# Patient Record
Sex: Male | Born: 1980
Health system: Southern US, Community
[De-identification: ages and names within clinical notes are randomized; demographics above are authoritative.]

## PROBLEM LIST (undated history)

## (undated) DIAGNOSIS — F4310 Post-traumatic stress disorder, unspecified: Secondary | ICD-10-CM

## (undated) DIAGNOSIS — E669 Obesity, unspecified: Secondary | ICD-10-CM

## (undated) DIAGNOSIS — F32A Depression, unspecified: Secondary | ICD-10-CM

## (undated) DIAGNOSIS — B029 Zoster without complications: Secondary | ICD-10-CM

## (undated) DIAGNOSIS — K219 Gastro-esophageal reflux disease without esophagitis: Secondary | ICD-10-CM

## (undated) DIAGNOSIS — F329 Major depressive disorder, single episode, unspecified: Secondary | ICD-10-CM

## (undated) DIAGNOSIS — F419 Anxiety disorder, unspecified: Secondary | ICD-10-CM

## (undated) DIAGNOSIS — F909 Attention-deficit hyperactivity disorder, unspecified type: Secondary | ICD-10-CM

## (undated) HISTORY — DX: Gastro-esophageal reflux disease without esophagitis: K21.9

## (undated) HISTORY — DX: Zoster without complications: B02.9

## (undated) HISTORY — PX: WISDOM TOOTH EXTRACTION: SHX21

## (undated) HISTORY — DX: Attention-deficit hyperactivity disorder, unspecified type: F90.9

## (undated) HISTORY — DX: Anxiety disorder, unspecified: F41.9

## (undated) HISTORY — DX: Major depressive disorder, single episode, unspecified: F32.9

## (undated) HISTORY — DX: Depression, unspecified: F32.A

## (undated) HISTORY — DX: Post-traumatic stress disorder, unspecified: F43.10

## (undated) HISTORY — DX: Obesity, unspecified: E66.9

---
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
## 2004-05-23 ENCOUNTER — Ambulatory Visit: Payer: Self-pay | Admitting: Family Medicine

## 2004-05-26 ENCOUNTER — Ambulatory Visit: Payer: Self-pay | Admitting: Family Medicine

## 2004-08-07 ENCOUNTER — Emergency Department (HOSPITAL_COMMUNITY): Admission: EM | Admit: 2004-08-07 | Discharge: 2004-08-07 | Payer: Self-pay | Admitting: Emergency Medicine

## 2004-09-08 ENCOUNTER — Emergency Department (HOSPITAL_COMMUNITY): Admission: EM | Admit: 2004-09-08 | Discharge: 2004-09-09 | Payer: Self-pay | Admitting: *Deleted

## 2004-09-17 ENCOUNTER — Emergency Department (HOSPITAL_COMMUNITY): Admission: EM | Admit: 2004-09-17 | Discharge: 2004-09-17 | Payer: Self-pay | Admitting: Emergency Medicine

## 2004-09-22 ENCOUNTER — Emergency Department (HOSPITAL_COMMUNITY): Admission: EM | Admit: 2004-09-22 | Discharge: 2004-09-22 | Payer: Self-pay | Admitting: *Deleted

## 2004-10-05 ENCOUNTER — Inpatient Hospital Stay (HOSPITAL_COMMUNITY): Admission: RE | Admit: 2004-10-05 | Discharge: 2004-10-09 | Payer: Self-pay | Admitting: Psychiatry

## 2004-10-05 ENCOUNTER — Ambulatory Visit: Payer: Self-pay | Admitting: Family Medicine

## 2004-10-06 ENCOUNTER — Ambulatory Visit: Payer: Self-pay | Admitting: Psychiatry

## 2004-10-10 ENCOUNTER — Other Ambulatory Visit (HOSPITAL_COMMUNITY): Admission: RE | Admit: 2004-10-10 | Discharge: 2004-10-21 | Payer: Self-pay | Admitting: Psychiatry

## 2004-11-09 ENCOUNTER — Ambulatory Visit (HOSPITAL_COMMUNITY): Payer: Self-pay | Admitting: Psychiatry

## 2004-12-06 ENCOUNTER — Ambulatory Visit: Payer: Self-pay | Admitting: Psychiatry

## 2004-12-06 ENCOUNTER — Other Ambulatory Visit (HOSPITAL_COMMUNITY): Admission: RE | Admit: 2004-12-06 | Discharge: 2004-12-16 | Payer: Self-pay | Admitting: Psychiatry

## 2005-01-24 ENCOUNTER — Emergency Department (HOSPITAL_COMMUNITY): Admission: EM | Admit: 2005-01-24 | Discharge: 2005-01-24 | Payer: Self-pay | Admitting: Emergency Medicine

## 2005-01-25 ENCOUNTER — Ambulatory Visit (HOSPITAL_COMMUNITY): Payer: Self-pay | Admitting: Psychiatry

## 2005-02-15 ENCOUNTER — Ambulatory Visit (HOSPITAL_COMMUNITY): Payer: Self-pay | Admitting: Psychiatry

## 2005-03-30 ENCOUNTER — Ambulatory Visit: Payer: Self-pay | Admitting: Family Medicine

## 2005-03-31 ENCOUNTER — Ambulatory Visit: Payer: Self-pay | Admitting: Family Medicine

## 2005-04-12 ENCOUNTER — Ambulatory Visit (HOSPITAL_COMMUNITY): Payer: Self-pay | Admitting: Psychiatry

## 2005-04-25 ENCOUNTER — Emergency Department (HOSPITAL_COMMUNITY): Admission: EM | Admit: 2005-04-25 | Discharge: 2005-04-25 | Payer: Self-pay | Admitting: Emergency Medicine

## 2005-06-07 ENCOUNTER — Ambulatory Visit (HOSPITAL_COMMUNITY): Payer: Self-pay | Admitting: Psychiatry

## 2005-07-26 ENCOUNTER — Ambulatory Visit (HOSPITAL_COMMUNITY): Payer: Self-pay | Admitting: Psychiatry

## 2005-08-05 ENCOUNTER — Emergency Department (HOSPITAL_COMMUNITY): Admission: AD | Admit: 2005-08-05 | Discharge: 2005-08-05 | Payer: Self-pay | Admitting: Family Medicine

## 2005-08-17 ENCOUNTER — Emergency Department (HOSPITAL_COMMUNITY): Admission: EM | Admit: 2005-08-17 | Discharge: 2005-08-17 | Payer: Self-pay | Admitting: Family Medicine

## 2005-08-31 ENCOUNTER — Ambulatory Visit: Payer: Self-pay | Admitting: Family Medicine

## 2006-02-21 ENCOUNTER — Ambulatory Visit (HOSPITAL_COMMUNITY): Payer: Self-pay | Admitting: Psychiatry

## 2006-03-20 ENCOUNTER — Ambulatory Visit: Payer: Self-pay | Admitting: Family Medicine

## 2006-04-12 ENCOUNTER — Ambulatory Visit: Payer: Self-pay | Admitting: Family Medicine

## 2006-04-25 ENCOUNTER — Ambulatory Visit (HOSPITAL_COMMUNITY): Payer: Self-pay | Admitting: Psychiatry

## 2006-06-07 ENCOUNTER — Ambulatory Visit (HOSPITAL_COMMUNITY): Payer: Self-pay | Admitting: Psychiatry

## 2006-08-15 ENCOUNTER — Ambulatory Visit (HOSPITAL_COMMUNITY): Payer: Self-pay | Admitting: Psychiatry

## 2006-11-14 ENCOUNTER — Ambulatory Visit (HOSPITAL_COMMUNITY): Payer: Self-pay | Admitting: Psychiatry

## 2007-07-10 ENCOUNTER — Ambulatory Visit (HOSPITAL_COMMUNITY): Payer: Self-pay | Admitting: Psychiatry

## 2007-10-09 ENCOUNTER — Ambulatory Visit (HOSPITAL_COMMUNITY): Payer: Self-pay | Admitting: Psychiatry

## 2007-11-20 ENCOUNTER — Ambulatory Visit (HOSPITAL_COMMUNITY): Payer: Self-pay | Admitting: Psychiatry

## 2007-11-21 ENCOUNTER — Ambulatory Visit: Payer: Self-pay | Admitting: Gastroenterology

## 2007-11-21 DIAGNOSIS — F902 Attention-deficit hyperactivity disorder, combined type: Secondary | ICD-10-CM

## 2007-11-21 DIAGNOSIS — K920 Hematemesis: Secondary | ICD-10-CM | POA: Insufficient documentation

## 2007-11-21 DIAGNOSIS — R197 Diarrhea, unspecified: Secondary | ICD-10-CM | POA: Insufficient documentation

## 2007-11-21 DIAGNOSIS — R112 Nausea with vomiting, unspecified: Secondary | ICD-10-CM | POA: Insufficient documentation

## 2007-11-22 ENCOUNTER — Ambulatory Visit: Payer: Self-pay | Admitting: Gastroenterology

## 2007-11-22 LAB — CONVERTED CEMR LAB
ALT: 74 units/L — ABNORMAL HIGH (ref 0–53)
Alkaline Phosphatase: 90 units/L (ref 39–117)
BUN: 7 mg/dL (ref 6–23)
Basophils Relative: 1.7 % (ref 0.0–3.0)
CO2: 29 meq/L (ref 19–32)
Calcium: 9.5 mg/dL (ref 8.4–10.5)
Chloride: 102 meq/L (ref 96–112)
Creatinine, Ser: 1.1 mg/dL (ref 0.4–1.5)
Glucose, Bld: 96 mg/dL (ref 70–99)
Hemoglobin: 14.7 g/dL (ref 13.0–17.0)
Lipase: 31 units/L (ref 11.0–59.0)
Lymphocytes Relative: 18.9 % (ref 12.0–46.0)
MCHC: 35.5 g/dL (ref 30.0–36.0)
Neutro Abs: 4 10*3/uL (ref 1.4–7.7)
Neutrophils Relative %: 68.1 % (ref 43.0–77.0)
Potassium: 4.3 meq/L (ref 3.5–5.1)
RBC: 3.77 M/uL — ABNORMAL LOW (ref 4.22–5.81)
Sodium: 139 meq/L (ref 135–145)
Total Bilirubin: 0.9 mg/dL (ref 0.3–1.2)
Total Protein: 6.6 g/dL (ref 6.0–8.3)

## 2007-11-27 ENCOUNTER — Ambulatory Visit (HOSPITAL_COMMUNITY): Admission: RE | Admit: 2007-11-27 | Discharge: 2007-11-27 | Payer: Self-pay | Admitting: Gastroenterology

## 2007-11-27 ENCOUNTER — Ambulatory Visit: Payer: Self-pay | Admitting: Gastroenterology

## 2007-11-28 LAB — CONVERTED CEMR LAB: Vitamin B-12: 307 pg/mL (ref 211–911)

## 2007-12-17 ENCOUNTER — Encounter: Payer: Self-pay | Admitting: Gastroenterology

## 2008-01-01 ENCOUNTER — Ambulatory Visit (HOSPITAL_COMMUNITY): Payer: Self-pay | Admitting: Psychiatry

## 2008-01-21 ENCOUNTER — Ambulatory Visit: Payer: Self-pay | Admitting: Gastroenterology

## 2008-01-21 DIAGNOSIS — R7402 Elevation of levels of lactic acid dehydrogenase (LDH): Secondary | ICD-10-CM | POA: Insufficient documentation

## 2008-01-21 DIAGNOSIS — R748 Abnormal levels of other serum enzymes: Secondary | ICD-10-CM | POA: Insufficient documentation

## 2008-01-21 DIAGNOSIS — R740 Nonspecific elevation of levels of transaminase and lactic acid dehydrogenase [LDH]: Secondary | ICD-10-CM

## 2008-01-21 DIAGNOSIS — R634 Abnormal weight loss: Secondary | ICD-10-CM | POA: Insufficient documentation

## 2008-01-21 DIAGNOSIS — K219 Gastro-esophageal reflux disease without esophagitis: Secondary | ICD-10-CM

## 2008-01-21 DIAGNOSIS — R197 Diarrhea, unspecified: Secondary | ICD-10-CM | POA: Insufficient documentation

## 2008-03-18 ENCOUNTER — Ambulatory Visit: Payer: Self-pay | Admitting: Gastroenterology

## 2008-03-18 LAB — CONVERTED CEMR LAB
ALT: 88 units/L — ABNORMAL HIGH (ref 0–53)
Albumin: 4.4 g/dL (ref 3.5–5.2)
Bilirubin, Direct: 0.3 mg/dL (ref 0.0–0.3)

## 2008-03-26 LAB — CONVERTED CEMR LAB
ALT: 36 units/L (ref 0–53)
Total Protein: 7.1 g/dL (ref 6.0–8.3)

## 2008-04-02 ENCOUNTER — Telehealth: Payer: Self-pay | Admitting: Gastroenterology

## 2008-04-08 ENCOUNTER — Ambulatory Visit: Payer: Self-pay | Admitting: Gastroenterology

## 2008-04-08 LAB — CONVERTED CEMR LAB
Ferritin: 449 ng/mL — ABNORMAL HIGH (ref 22.0–322.0)
INR: 0.9 (ref 0.8–1.0)

## 2008-04-09 ENCOUNTER — Ambulatory Visit: Payer: Self-pay | Admitting: Gastroenterology

## 2008-04-13 ENCOUNTER — Encounter: Payer: Self-pay | Admitting: Gastroenterology

## 2008-04-19 LAB — CONVERTED CEMR LAB
Angiotensin 1 Converting Enzyme: 26 units/L (ref 9–67)
Anti Nuclear Antibody(ANA): NEGATIVE
Ceruloplasmin: 16 mg/dL — ABNORMAL LOW (ref 21–63)
Hep A IgM: NEGATIVE
Hep B C IgM: NEGATIVE

## 2008-04-22 ENCOUNTER — Encounter: Payer: Self-pay | Admitting: Gastroenterology

## 2008-04-28 ENCOUNTER — Telehealth (INDEPENDENT_AMBULATORY_CARE_PROVIDER_SITE_OTHER): Payer: Self-pay

## 2008-05-27 ENCOUNTER — Ambulatory Visit (HOSPITAL_COMMUNITY): Payer: Self-pay | Admitting: Psychiatry

## 2008-06-11 ENCOUNTER — Ambulatory Visit: Payer: Self-pay | Admitting: Gastroenterology

## 2008-07-08 ENCOUNTER — Ambulatory Visit (HOSPITAL_COMMUNITY): Payer: Self-pay | Admitting: Psychiatry

## 2008-07-13 ENCOUNTER — Ambulatory Visit: Payer: Self-pay | Admitting: Gastroenterology

## 2008-07-26 ENCOUNTER — Emergency Department (HOSPITAL_COMMUNITY): Admission: EM | Admit: 2008-07-26 | Discharge: 2008-07-26 | Payer: Self-pay | Admitting: Emergency Medicine

## 2008-07-29 ENCOUNTER — Ambulatory Visit (HOSPITAL_COMMUNITY): Payer: Self-pay | Admitting: Psychiatry

## 2008-12-14 ENCOUNTER — Ambulatory Visit: Payer: Self-pay | Admitting: Psychiatry

## 2008-12-14 ENCOUNTER — Inpatient Hospital Stay (HOSPITAL_COMMUNITY): Admission: AD | Admit: 2008-12-14 | Discharge: 2008-12-16 | Payer: Self-pay | Admitting: Psychiatry

## 2008-12-17 ENCOUNTER — Other Ambulatory Visit (HOSPITAL_COMMUNITY): Admission: RE | Admit: 2008-12-17 | Discharge: 2009-01-15 | Payer: Self-pay | Admitting: Psychiatry

## 2008-12-17 ENCOUNTER — Ambulatory Visit: Payer: Self-pay | Admitting: Psychiatry

## 2009-01-18 ENCOUNTER — Other Ambulatory Visit (HOSPITAL_COMMUNITY): Admission: RE | Admit: 2009-01-18 | Discharge: 2009-02-04 | Payer: Self-pay | Admitting: Psychiatry

## 2009-02-18 ENCOUNTER — Telehealth: Payer: Self-pay | Admitting: Gastroenterology

## 2009-02-24 ENCOUNTER — Ambulatory Visit (HOSPITAL_COMMUNITY): Payer: Self-pay | Admitting: Psychiatry

## 2009-03-12 ENCOUNTER — Ambulatory Visit: Payer: Self-pay | Admitting: Gastroenterology

## 2009-03-15 LAB — CONVERTED CEMR LAB
AST: 53 units/L — ABNORMAL HIGH (ref 0–37)
Albumin: 4.5 g/dL (ref 3.5–5.2)
Alkaline Phosphatase: 81 units/L (ref 39–117)
Bilirubin, Direct: 0.2 mg/dL (ref 0.0–0.3)

## 2009-03-17 ENCOUNTER — Ambulatory Visit (HOSPITAL_COMMUNITY): Payer: Self-pay | Admitting: Marriage and Family Therapist

## 2009-04-13 ENCOUNTER — Telehealth: Payer: Self-pay | Admitting: Internal Medicine

## 2009-05-10 ENCOUNTER — Ambulatory Visit: Payer: Self-pay | Admitting: Gastroenterology

## 2009-05-19 ENCOUNTER — Ambulatory Visit (HOSPITAL_COMMUNITY): Payer: Self-pay | Admitting: Psychiatry

## 2009-06-29 ENCOUNTER — Ambulatory Visit: Payer: Self-pay | Admitting: Psychiatry

## 2009-11-11 LAB — CONVERTED CEMR LAB
ALT: 38 units/L (ref 0–53)
Albumin: 4.3 g/dL (ref 3.5–5.2)
Total Protein: 6.6 g/dL (ref 6.0–8.3)

## 2009-12-01 ENCOUNTER — Ambulatory Visit: Payer: Self-pay | Admitting: Internal Medicine

## 2009-12-02 LAB — CONVERTED CEMR LAB
Alkaline Phosphatase: 98 units/L (ref 39–117)
Bilirubin, Direct: 0.1 mg/dL (ref 0.0–0.3)
Total Bilirubin: 0.6 mg/dL (ref 0.3–1.2)

## 2010-02-07 ENCOUNTER — Telehealth: Payer: Self-pay | Admitting: Gastroenterology

## 2010-02-15 NOTE — Progress Notes (Signed)
Summary: Liver test  Phone Note Call from Patient Call back at Home Phone [phone]   Caller: Heidi Dach  Call For: Dr Russella Dar Reason for Call: Talk to Nurse Summary of Call: Was told he would get scheduled for another liver test and it has been a month and he has not heard back. Initial call taken by: Leanor Kail [facility],  April 13, 2009 12:44 PM  Follow-up for Phone Call        Mother informed that labs were ordered for 2 months and are due 05/10/09. Follow-up by: Teryl Lucy RN,  April 13, 2009 1:40 PM

## 2010-02-15 NOTE — Progress Notes (Signed)
Summary: Nexium  Phone Note Call from Patient Call back at Home Phone [phone]   Caller: mother,Martha Call For: Dr. Russella Dar Reason for Call: Refill Medication Summary of Call: pt needs one more refill or samples of Nexium to last until next appt 2/25... please call [facility] [place] and let her know what to expect Initial call taken by: Vallarie Mare,  February 18, 2009 10:01 AM  Follow-up for Phone Call        Tried to call number and there was no answer and no voicemail. Follow-up by: Christie Nottingham CMA [facility]),  February 18, 2009 10:28 AM  Additional Follow-up for Phone Call Additional follow up Details #1::        Pt's mother called back and states he made a appt for 03/12/09 and really could use a refill until then. I told her that I would give him just one refill until his appt but he has to come for that appt or he cannot have any other refills. Pt's mother agreed.  Additional Follow-up by: Christie Nottingham CMA (AAMA),  February 18, 2009 10:33 AM    New/Updated Medications: NEXIUM 40 MG CPDR (ESOMEPRAZOLE MAGNESIUM) one tablet by mouth once daily Prescriptions: NEXIUM 40 MG CPDR (ESOMEPRAZOLE MAGNESIUM) one tablet by mouth once daily  #30 x 11   Entered by:   Christie Nottingham CMA (AAMA)   Authorized by:   Meryl Dare MD [facility]   Signed by:   Christie Nottingham CMA Duncan Dull) on 02/18/2009   Method used:   [facility]        [facility] [phone]* (retail)       [address]       [place], [place]  [address]       Ph: [phone] or [phone]       Fax: [phone]   RxID:   [id number]

## 2010-02-15 NOTE — Assessment & Plan Note (Signed)
Summary: refill Nexium...as.   History of Present Illness Visit Type: Follow-up Visit Primary GI MD: Elie Goody MD [facility] Primary Provider: Joette Catching, MD  Requesting Provider: n/a Chief Complaint: GERD. Pt needs Nexium refilled. History of Present Illness:   This is a return office visit for GERD with erosive esophagitis, and abnormal liver function tests. A full serologic evaluation and an abdominal ultrasound were unremarkable last year.  His reflux symptoms are under excellent control with Nexium. His appetite is good. He has been steadily gained weight.   GI Review of Systems      Denies abdominal pain, acid reflux, belching, bloating, chest pain, dysphagia with liquids, dysphagia with solids, heartburn, loss of appetite, nausea, vomiting, vomiting blood, weight loss, and  weight gain.        Denies anal fissure, black tarry stools, change in bowel habit, constipation, diarrhea, diverticulosis, fecal incontinence, heme positive stool, hemorrhoids, irritable bowel syndrome, jaundice, light color stool, liver problems, rectal bleeding, and  rectal pain.   Current Medications (verified): 1)  Focalin Xr 20 Mg Xr24h-Cap (Dexmethylphenidate Hcl) .Marland Kitchen.. 1  Capsule Three Times A Day 2)  Gabapentin 600 Mg Tabs (Gabapentin) .... 3 Tablets By Mouth Three Times A Day 3)  Trazamine 50 Mg Misc (Trazodone & Diet Manage Prod) .Marland Kitchen.. 1 Tablet By Mouth At Bedtime 4)  Clonazepam 1 Mg Tabs (Clonazepam) .Marland Kitchen.. 1 Tablet Two Times A Day 5)  Rozerem 8 Mg Tabs (Ramelteon) .Marland Kitchen.. 1 Tablet By Mouth At Bedtime 6)  Nexium 40 Mg Cpdr (Esomeprazole Magnesium) .... One Tablet By Mouth Once Daily  Allergies (verified): 1)  ! Ceclor  Past History:  Past Medical History: ADD (ICD-314.00) Anxiety Disorder Depression L chest shingles GERD with LA Classification Grade C EE  Past Surgical History: Reviewed history from 01/21/2008 and no changes required. Wisdom Teeth extraction Tooth implant  Family  History: Reviewed history from 11/21/2007 and no changes required. Family History of Diabetes: ? father No FH of Colon Cancer: Patient adopted   Social History: Reviewed history from 01/21/2008 and no changes required. Patient currently smokes. 1/2 PPD Alcohol Use - yes 1-2 glasses wine weekly Daily Caffeine Use 1 soda or coffee per day Illicit Drug Use - no Patient gets regular exercise.  Review of Systems       The pertinent positives and negatives are noted as above and in the HPI. All other ROS were reviewed and were negative.   Vital Signs:  Patient profile:   30 year old male Height:      72 inches Weight:      214 pounds BMI:     29.13 BSA:     2.19 Pulse rate:   96 / minute Pulse rhythm:   regular BP sitting:   128 / 80  (left arm) Cuff size:   regular  Vitals Entered By: Ok Anis CMA (March 12, 2009 3:17 PM)  Physical Exam  General:  Well developed, well nourished, no acute distress. Head:  Normocephalic and atraumatic. Eyes:  PERRLA, no icterus. Mouth:  No deformity or lesions, dentition normal. Lungs:  Clear throughout to auscultation. Heart:  Regular rate and rhythm; no murmurs, rubs,  or bruits. Abdomen:  Soft, nontender and nondistended. No masses, hepatosplenomegaly or hernias noted. Normal bowel sounds. Psych:  Alert and cooperative. Normal mood and affect.  Impression & Recommendations:  Problem # 1:  GERD (ICD-530.81) GERD with LA classification grade C erosive esophagitis. Reflux symptoms under complete control on antireflux measures and Nexium. Continue Nexium  40 mg q.a.m. and antireflux measures. Long-term.  Problem # 2:  ABNORMAL TRANSAMINASE-LFT'S (ICD-790.4) Abnormal liver function tests without a clear etiology uncovered. Repeat liver function tests today. If they remain abnormal, we discussed proceeding with a liver biopsy for further evaluation. If his liver and liver function tests have normalized, will monitor every 6 months for the  next 1-2 years.  Other Orders: TLB-Hepatic/Liver Function Pnl ([id number])  Patient Instructions: 1)  Nexium has been sent to your pharmacy.  2)  Get your labs drawn today in the basement.  3)  Please continue current medications.  4)  Please schedule a follow-up appointment in 6 months. 5)  Copy sent to : Joette Catching, MD 6)  The medication list was reviewed and reconciled.  All changed / newly prescribed medications were explained.  A complete medication list was provided to the patient / caregiver.

## 2010-02-17 NOTE — Progress Notes (Signed)
Summary: Nexium refill-sched f-up  Phone Note Call from Patient Call back at Home Phone [phone]   Caller: Mom Call For: Dr Russella Dar Summary of Call: Scheduled yearly f-up. Is allout of Nexium now- Can we please refill? Initial call taken by: Leanor Kail [facility],  February 07, 2010 10:06 AM  Follow-up for Phone Call        Rx was sent to pts pharmacy. Pt notified to keep appt for further refills.  Follow-up by: Christie Nottingham CMA (AAMA),  February 07, 2010 10:14 AM    New/Updated Medications: NEXIUM 40 MG CPDR (ESOMEPRAZOLE MAGNESIUM) one tablet by mouth once daily Prescriptions: NEXIUM 40 MG CPDR (ESOMEPRAZOLE MAGNESIUM) one tablet by mouth once daily  #30 x 0   Entered by:   Christie Nottingham CMA (AAMA)   Authorized by:   Meryl Dare MD [facility]   Signed by:   Christie Nottingham CMA Duncan Dull) on 02/07/2010   Method used:   [facility]        [facility] [phone][facility] (retail)       [address]       [place], [place]  [address]       Ph: [phone] or [phone]       Fax: [phone]   RxID:   [id number]

## 2010-03-04 ENCOUNTER — Telehealth: Payer: Self-pay | Admitting: Gastroenterology

## 2010-03-07 ENCOUNTER — Other Ambulatory Visit: Payer: Self-pay

## 2010-03-09 NOTE — Progress Notes (Signed)
Summary: Labs  Phone Note Outgoing Call Call back at [facility] Phone [phone]   Call placed by: Christie Nottingham CMA Duncan Dull),  March 04, 2010 8:51 AM Call placed to: Patient Summary of Call: Spoke with patient's mother and she states she will notify patient to come in for repeat lab work next week. Lab order put in Epic.  Initial call taken by: Christie Nottingham CMA [facility] Dull),  March 04, 2010 8:52 AM

## 2010-03-10 ENCOUNTER — Other Ambulatory Visit: Payer: Self-pay

## 2010-03-10 ENCOUNTER — Encounter (INDEPENDENT_AMBULATORY_CARE_PROVIDER_SITE_OTHER): Payer: Self-pay | Admitting: *Deleted

## 2010-03-10 ENCOUNTER — Other Ambulatory Visit: Payer: Self-pay | Admitting: Gastroenterology

## 2010-03-10 DIAGNOSIS — R740 Nonspecific elevation of levels of transaminase and lactic acid dehydrogenase [LDH]: Secondary | ICD-10-CM

## 2010-03-10 LAB — HEPATIC FUNCTION PANEL
ALT: 67 U/L — ABNORMAL HIGH (ref 0–53)
Alkaline Phosphatase: 82 U/L (ref 39–117)
Total Protein: 7.2 g/dL (ref 6.0–8.3)

## 2010-03-14 ENCOUNTER — Ambulatory Visit: Payer: Self-pay | Admitting: Gastroenterology

## 2010-03-24 ENCOUNTER — Ambulatory Visit: Payer: Self-pay | Admitting: Gastroenterology

## 2010-04-03 LAB — BENZODIAZEPINE, QUANTITATIVE, URINE
Alprazolam (GC/LC/MS), ur confirm: NEGATIVE
Flurazepam GC/MS Conf: NEGATIVE
Oxazepam GC/MS Conf: 150 ng/mL

## 2010-04-03 LAB — URINE DRUGS OF ABUSE SCREEN W ALC, ROUTINE (REF LAB)
Creatinine,U: 280.9 mg/dL
Ethyl Alcohol: 15 mg/dL — ABNORMAL HIGH (ref <10)
Marijuana Metabolite: NEGATIVE
Methadone: NEGATIVE
Opiate Screen, Urine: NEGATIVE
Propoxyphene: NEGATIVE

## 2010-04-03 LAB — ETHANOL CONFIRM, URINE

## 2010-04-04 ENCOUNTER — Ambulatory Visit: Payer: Self-pay | Admitting: Gastroenterology

## 2010-04-06 ENCOUNTER — Other Ambulatory Visit: Payer: Self-pay | Admitting: Gastroenterology

## 2010-04-19 LAB — URINE DRUGS OF ABUSE SCREEN W ALC, ROUTINE (REF LAB)
Barbiturate Quant, Ur: NEGATIVE
Marijuana Metabolite: NEGATIVE
Methadone: NEGATIVE
Phencyclidine (PCP): NEGATIVE
Propoxyphene: NEGATIVE

## 2010-04-19 LAB — BENZODIAZEPINE, QUANTITATIVE, URINE
Alprazolam (GC/LC/MS), ur confirm: NEGATIVE
Flurazepam GC/MS Conf: NEGATIVE

## 2010-04-24 LAB — POCT I-STAT, CHEM 8
BUN: <3 mg/dL — ABNORMAL LOW (ref 6–23)
Calcium, Ion: 1.12 mmol/L (ref 1.12–1.32)
Chloride: 107 mEq/L (ref 96–112)
Creatinine, Ser: 0.8 mg/dL (ref 0.4–1.5)
Glucose, Bld: 91 mg/dL (ref 70–99)

## 2010-05-10 ENCOUNTER — Other Ambulatory Visit: Payer: Self-pay | Admitting: Gastroenterology

## 2010-05-13 ENCOUNTER — Ambulatory Visit (INDEPENDENT_AMBULATORY_CARE_PROVIDER_SITE_OTHER): Payer: BC Managed Care – PPO | Admitting: Gastroenterology

## 2010-05-13 ENCOUNTER — Encounter: Payer: Self-pay | Admitting: Gastroenterology

## 2010-05-13 VITALS — BP 132/80 | HR 96 | Ht 70.0 in | Wt 268.0 lb

## 2010-05-13 DIAGNOSIS — K219 Gastro-esophageal reflux disease without esophagitis: Secondary | ICD-10-CM

## 2010-05-13 MED ORDER — ESOMEPRAZOLE MAGNESIUM 40 MG PO CPDR: 40.0000 mg | DELAYED_RELEASE_CAPSULE | Freq: Every day | ORAL | Status: DC

## 2010-05-13 NOTE — Patient Instructions (Signed)
Go directly to the basement to have your labs drawn.  Nexium has been sent to your pharmacy.

## 2010-05-13 NOTE — Progress Notes (Signed)
History of Present Illness: This is a return office visit for GERD and abnormal liver function tests. His liver function tests have improved his recent hepatic panel was done in February with a minimally elevated ALT and AST. He has had an extensive evaluation in the past that was remarkable only for a slightly low ceruloplasmin. Urinary copper excretion was normal and an ophthalmologic exam for [facility] rings was negative in April 2010. He is on multiple medications. He has gained weight since I last saw him. Prior imaging studies did not reveal fatty liver. He states he has occasional loose stools but generally has normal bowel movements. He was concerned this might be related to Nexium. Since he has increased his dietary fiber is noted his diarrhea has essentially resolved.  Current Medications, Allergies, Past Medical History, Past Surgical History, Family History and Social History were reviewed in [employer] record.  Physical Exam: General: Well developed , well nourished, no acute distress. Overweight. Head: Normocephalic and atraumatic Eyes:  sclerae anicteric, EOMI Ears: Normal auditory acuity Mouth: No deformity or lesions Lungs: Clear throughout to auscultation Heart: Regular rate and rhythm; no murmurs, rubs or bruits Abdomen: Soft, non tender and non distended. No masses, hepatosplenomegaly or hernias noted. Normal Bowel sounds Musculoskeletal: Symmetrical with no gross deformities  Pulses:  Normal pulses noted Extremities: No clubbing, cyanosis, edema or deformities noted Neurological: Alert oriented x 4, grossly nonfocal Psychological:  Alert and cooperative. Normal mood and affect  Assessment and Recommendations:  1. GERD. Continue Nexium 40 mg daily.  2. Mildly elevated transaminases. Etiology unclear. Only abnormality on prior evaluation was a low ceruloplasmin but urinary copper excretion was normal and ophthalmologic exam was negative for  [employer] rings. Consider medication side effects. Plan for repeat hepatic panel today and again in 2-3 months. If his liver tests remain abnormal consider repeat ultrasound imaging and percutaneous liver biopsy. I discussed with her biopsy with him in detail and he is agreeable to proceed if his liver tests remain abnormal.  3. Mild diarrhea likely related to inadequate dietary fiber. Symptoms have improved with fiber. Increase daily fiber and water intake.  Contact me if his symptoms do not continue to improve.

## 2010-06-03 NOTE — Discharge Summary (Signed)
NAME:  Steve Bass, Steve Bass NO.:  [id number]   MEDICAL RECORD NO.:  [id number]          PATIENT TYPE:  IPS   LOCATION:  0505                          FACILITY:  [facility]   PHYSICIAN:  Geoffery Lyons, M.D.      DATE OF BIRTH:  04-17-1980   DATE OF ADMISSION:  10/05/2004  DATE OF DISCHARGE:  10/09/2004                                 DISCHARGE SUMMARY   CHIEF COMPLAINT AND PRESENT ILLNESS:  This was the first admission to [facility] for this 30 year old single white male voluntarily  admitted.  History of opiate abuse.  Has been taking 25-40 Percocet daily.  Occasional use of crack cocaine.  Denied any alcohol use.  Ongoing  prescription for Klonopin.  Takes more than prescribed.  Denies any current  weight loss.  Does feel depressed with his substance use, feeling very  guilty.  Denied any suicidal thoughts.  Motivated to stop.  Claimed that he  takes pills to make him more sociable.   PAST PSYCHIATRIC HISTORY:  First time at [facility].  Sees Dr.  Madaline Guthrie.  Was detoxed before in 2002.   ALCOHOL/DRUG HISTORY:  Endorsed use of opioids as well as benzodiazepines.  Occasional use of cocaine.   MEDICAL HISTORY:  Noncontributory.   MEDICATIONS:  Had been on Klonopin as well as Neurontin.   PHYSICAL EXAMINATION:  Performed and failed to show any acute findings.   LABORATORY DATA:  CBC with white blood cells 6.2, hemoglobin 14.3.  Blood  chemistry with glucose 73.  Liver enzymes with SGOT 15, SGPT 29, total  bilirubin 0.6, TSH 1.543.   MENTAL STATUS EXAM:  Alert, cooperative male.  Polite.  Good eye contact.  Casually dressed.  Speech was clear, normal in pace and tone.  Feeling  guilty and depressed, teary-eyed at times when talking about the situation.  Thought processes are logical, coherent and relevant.  No evidence of  delusions.  No active suicidal or homicidal ideation.  No hallucinations.  Cognition was well-preserved.   ADMISSION  DIAGNOSES:  AXIS I:  Opiate dependence.  Polysubstance abuse.  Rule out substance-induced mood disorder.  AXIS II:  No diagnosis.  AXIS III:  No diagnosis.  AXIS IV:  Moderate.  AXIS V:  GAF upon admission 40; highest GAF in the last year 65.   HOSPITAL COURSE:  He was admitted.  He was started in individual and group  psychotherapy.  He was detoxified with clonidine as well as Librium.  He was  placed back on Neurontin, up to 600 mg, 3 twice a day and then at bedtime.  He endorsed that he had continued using the opiates, 30-40 pills daily.  Having a hard time with withdrawal, keep relapsing.  Is in school, working  but endorsed that his use of opiates were affecting both his work and his  school.  Getting into some clinical behaviors.  He endorsed that he was  somewhat into being considered for methadone as he had used it before.  By  September 22nd, he endorsed that the withdrawal symptoms were under better  control.  Slept  better, decrease in the shakiness as well as decrease in the  cravings.  Endorsed the last time he was admitted, he really did not want to  abstain long-term.  This time around, he endorsed he was more serious about  it, having more consequences.  We continued to detox.  We continued to  stabilize.  He was thinking about methadone but, the more he thought about  it, he felt that he was feeling stable enough that he was going to try to do  it without methadone.  He had done methadone before when he was in the [facility] [place] basically for detox.  He was willing to go to [facility] IOP, work  on different strategies, relapse prevention.  On September 24th, he was in  full contact with reality.  There were no suicidal or homicidal ideation.  No hallucinations, no delusions.  Fully detoxed.  No active withdrawal.  Willing to pursue CD IOP.  Had basically decided against going for the  methadone program.   DISCHARGE DIAGNOSES:  AXIS I:  Opiate dependence.  Polysubstance  abuse.  Depressive disorder not otherwise specified.  AXIS II:  No diagnosis.  AXIS III:  No diagnosis.  AXIS IV:  Moderate.  AXIS V:  GAF upon discharge 50-55.   DISCHARGE MEDICATIONS:  1.  Clonidine taper 0.1 mg, 1 at night on October 09, 2004, then 1 in the      morning on October 10, 2004 and October 11, 2004, then discontinue.  2.  Trazodone 100 mg at night.  3.  Neurontin 600 mg, 3 twice a day and 3 at bedtime.   FOLLOW UP:  [facility].      Geoffery Lyons, M.D.  Electronically Signed     IL/MEDQ  D:  11/07/2004  T:  11/08/2004  Job:  [id number]

## 2010-06-03 NOTE — H&P (Signed)
NAME:  ZYQUAN, CROTTY NO.:  [id number]   MEDICAL RECORD NO.:  [id number]          PATIENT TYPE:  IPS   LOCATION:  0505                          FACILITY:  [facility]   PHYSICIAN:  Geoffery Lyons, M.D.      DATE OF BIRTH:  Feb 23, 1980   DATE OF ADMISSION:  10/05/2004  DATE OF DISCHARGE:                         PSYCHIATRIC ADMISSION ASSESSMENT   IDENTIFYING INFORMATION:  A 30 year old single white male, voluntarily  admitted on October 05, 2004.   HISTORY OF PRESENT ILLNESS:  The patient presents with a history of opiate  abuse, has been taking 25 to 40 Percocets daily with occasional use of crack  cocaine.  Denies any alcohol use.  He states he does have an ongoing  prescription for Klonopin but he normally takes more than is prescribed.  Denies any current weight loss.  He does feel depressed with his substance  abuse, feeling very guilty.  He denies any suicidal thoughts.  He is  motivated to stop.  The patient states that he takes the pills to make him  more sociable.   PAST PSYCHIATRIC HISTORY:  First admission to [facility].  He  sees Dr. Madaline Guthrie as a psychiatrist, has been detoxed prior in 2002.   SOCIAL HISTORY:  He is a 30 year old single white male with no children.  He  lives alone.  He is attending [employer].  He works at [employer] as a Investment banker, operational.  He  states he is on supervised probation for a DUI, which he states is his  first.   FAMILY HISTORY:  NONE.   ALCOHOL DRUG HISTORY:  The patient smokes.  No current alcohol.  Denies any  IV drug use, other substances as described above.   PAST MEDICAL HISTORY:  Primary care Lexis Potenza is Dr. Lysbeth Galas.  Medical  problems:  This is a healthy-appearing male.  He denies any current medical  problems.   MEDICATIONS:  He has been on Klonopin.   DRUG ALLERGIES:  CECLOR.   PHYSICAL EXAMINATION:  Review of systems:  Denies any chest pain, shortness  of breath, nausea and vomiting, has a long history of substance  abuse.  Temperature is 97.3, 72 heart rate, 22 respirations.  Blood pressure is  126/99.  He is 200 pounds, approximately 5 feet 10 inches tall.  He is a  healthy-appearing young male in no acute distress.  Negative  lymphadenopathy.  Chest is clear.  Heart is regular rate and rhythm, no  murmurs, gallops or rubs.  Abdomen is a soft, nontender abdomen.  Extremities:  Moves all extremities, no clubbing, no deformities.  The  patient's skin is warm and dry.  He does have tattoos noted to his left  forearm and right humerus.  Neurological findings are intact, nonfocal.  Able to easily perform heel-to-shin, normal alternating movements.   LABORATORY DATA:  CBC is within normal limits.  CMET is within normal  limits.  TSH is 1.543.  Acetaminophen level was less than 10.   MENTAL STATUS EXAM:  Alert, young male, cooperative, polite, good eye  contact, casually dressed.  Speech is clear, normal pace and tone.  The  patient feels very guilty and depressed.  The patient gets teary-eyed at  times.  Thought processes are coherent, there is no evidence of psychosis.  Cognitive function intact.  Memory is good, judgment and insight is fair,  poor impulse control.  He is of average intelligence.  Concentration intact.   ADMISSION DIAGNOSES:  AXIS I:  Opiate dependence, polysubstance abuse, rule  out substance-induced mood disorder.  AXIS II:  Deferred.  AXIS III:  None.  AXIS IV:  Other psychosocial problems related to chronic drug use.  AXIS V:  Current is 40, estimated this past year is 29.   PLAN:  Plan is to detox the patient, work on relapse prevention.  Will  consider an antidepressant.  Will have a family session with mother.  The  patient is to remain drug free.  The patient is to continue to follow up  with Dr. Madaline Guthrie.  Case manager is to obtain followup appointments.  Will  look at rehab programs available to patient.   TENTATIVE LENGTH OF CARE:  4 days.      Landry Corporal,  N.P.      Geoffery Lyons, M.D.  Electronically Signed    JO/MEDQ  D:  10/07/2004  T:  10/07/2004  Job:  [id number]

## 2010-06-06 ENCOUNTER — Other Ambulatory Visit: Payer: Self-pay

## 2010-06-06 ENCOUNTER — Other Ambulatory Visit: Payer: Self-pay | Admitting: Gastroenterology

## 2011-02-03 ENCOUNTER — Telehealth: Payer: Self-pay | Admitting: Gastroenterology

## 2011-02-03 NOTE — Telephone Encounter (Signed)
Dr Russella Dar I reviewed your notes from the last office visit on 05/13/11.  The patient never came for his repeat labs that were ordered for June.   Would you like LFTs prior to him coming for the appt on 02/21/11?  Please advise

## 2011-02-03 NOTE — Telephone Encounter (Signed)
Yes

## 2011-02-03 NOTE — Telephone Encounter (Signed)
Patient advised.

## 2011-02-16 ENCOUNTER — Other Ambulatory Visit (INDEPENDENT_AMBULATORY_CARE_PROVIDER_SITE_OTHER): Payer: BC Managed Care – PPO

## 2011-02-16 DIAGNOSIS — R7402 Elevation of levels of lactic acid dehydrogenase (LDH): Secondary | ICD-10-CM

## 2011-02-16 LAB — HEPATIC FUNCTION PANEL
ALT: 79 U/L — ABNORMAL HIGH (ref 0–53)
AST: 34 U/L (ref 0–37)
Albumin: 4.3 g/dL (ref 3.5–5.2)
Total Bilirubin: 0.4 mg/dL (ref 0.3–1.2)

## 2011-02-17 ENCOUNTER — Other Ambulatory Visit: Payer: Self-pay

## 2011-02-21 ENCOUNTER — Ambulatory Visit (INDEPENDENT_AMBULATORY_CARE_PROVIDER_SITE_OTHER): Payer: BC Managed Care – PPO | Admitting: Gastroenterology

## 2011-02-21 ENCOUNTER — Encounter: Payer: Self-pay | Admitting: Gastroenterology

## 2011-02-21 VITALS — BP 130/80 | HR 110 | Ht 73.0 in | Wt 305.6 lb

## 2011-02-21 DIAGNOSIS — K219 Gastro-esophageal reflux disease without esophagitis: Secondary | ICD-10-CM

## 2011-02-21 MED ORDER — ESOMEPRAZOLE MAGNESIUM 40 MG PO CPDR: 40.0000 mg | DELAYED_RELEASE_CAPSULE | Freq: Every day | ORAL | Status: DC

## 2011-02-21 NOTE — Progress Notes (Signed)
History of Present Illness: This is a 31 year old male with GERD and mildly elevated transaminases. A full serologic evaluation for imaging studies were performed to 3 years ago and were unremarkable. His ceruloplasmin was below normal however urinary copper excretion and ophthalmologic examinations were negative. His LFTs did normalize on one occasion but generally he has had persistent mildly elevated ALT. Denies weight loss, abdominal pain, constipation, diarrhea, change in stool caliber, melena, hematochezia, nausea, vomiting, dysphagia, chest pain.  Current Medications, Allergies, Past Medical History, Past Surgical History, Family History and Social History were reviewed in [employer] record.  Physical Exam: General: Well developed , well nourished, obese, no acute distress Head: Normocephalic and atraumatic Eyes:  sclerae anicteric, EOMI Ears: Normal auditory acuity Mouth: No deformity or lesions Lungs: Clear throughout to auscultation Heart: Regular rate and rhythm; no murmurs, rubs or bruits Abdomen: Soft, non tender and non distended. No masses, hepatosplenomegaly or hernias noted. Normal Bowel sounds Musculoskeletal: Symmetrical with no gross deformities  Extremities: No clubbing, cyanosis, edema or deformities noted Neurological: Alert oriented x 4, grossly nonfocal Psychological:  Alert and cooperative. Normal mood and affect  Assessment and Recommendations:  1. GERD. Continue Nexium 40 mg daily along with standard antireflux measures.  2. Persistently elevated transaminases without clear etiology. Medication side effects are a consideration. I discussed with him undergoing a percutaneous liver biopsy but he is very reluctant to proceed and is quite frightened of this procedure. We'll therefore monitor his liver function tests every several months. If they remain persistently abnormal or increased we will again discuss a liver biopsy.

## 2011-02-21 NOTE — Patient Instructions (Addendum)
You have been given a separate informational sheet regarding your tobacco use, the importance of quitting and local resources to help you quit. Your prescription for Nexium has been sent to your pharmacy. Please follow up in May to get your liver function tests.   cc: Joette Catching, MD

## 2011-03-06 ENCOUNTER — Ambulatory Visit (INDEPENDENT_AMBULATORY_CARE_PROVIDER_SITE_OTHER): Payer: BC Managed Care – PPO | Admitting: Physician Assistant

## 2011-03-06 DIAGNOSIS — J069 Acute upper respiratory infection, unspecified: Secondary | ICD-10-CM

## 2011-03-06 DIAGNOSIS — R05 Cough: Secondary | ICD-10-CM

## 2011-03-06 MED ORDER — HYDROCOD POLST-CHLORPHEN POLST 10-8 MG/5ML PO LQCR: 5.0000 mL | Freq: Two times a day (BID) | ORAL | Status: DC

## 2011-03-06 MED ORDER — IPRATROPIUM BROMIDE 0.03 % NA SOLN: 2.0000 | Freq: Three times a day (TID) | NASAL | Status: DC

## 2011-03-06 NOTE — Progress Notes (Signed)
  Subjective:    Patient ID: Steve Bass, male    DOB: 09-07-1980, 31 y.o.   MRN: [id number]  HPI Pt presents with 4 d h/o cold symptoms.  Started with congestion and some PND now significant cough that is not allowing him the sleep.  He has yellow/green rhinorrhea with PND and coughing up same colored sputum.  No SOB no h/o asthma.  Pt socially smokes2-3 cigs per day.  He has not smoked since getting sick.  No f/c, no N/V/D.  He has tried mucinex and other OTC cold preps without much help.  He is having some R lower rib pain with coughing.   Review of Systems  Constitutional: Negative for fever and chills.  HENT: Positive for congestion, rhinorrhea, postnasal drip and sinus pressure. Negative for ear pain and ear discharge.   Respiratory: Positive for cough. Negative for shortness of breath and wheezing.   Gastrointestinal: Negative for nausea, vomiting, abdominal pain and diarrhea.  Neurological: Negative for light-headedness and headaches.       Objective:   Physical Exam  Constitutional: He appears well-developed and well-nourished.  HENT:  Head: Normocephalic and atraumatic.  Right Ear: Tympanic membrane, external ear and ear canal normal.  Left Ear: Tympanic membrane, external ear and ear canal normal.  Nose: Mucosal edema (red and swollen) present.  Mouth/Throat: Uvula is midline and oropharynx is clear and moist.  Neck: Normal range of motion.  Cardiovascular: Normal rate, regular rhythm and normal heart sounds.   No murmur heard. Pulmonary/Chest: Effort normal and breath sounds normal. He has no wheezes.  Lymphadenopathy:    He has no cervical adenopathy.   +TTP over R lower ribs       Assessment & Plan:   1. Cough  chlorpheniramine-HYDROcodone (TUSSIONEX PENNKINETIC ER) 10-8 MG/5ML LQCR  2. URI, acute  ipratropium (ATROVENT) 0.03 % nasal spray   Cont mucinex, stop all smoking, push fluids, tylenol/motrin prn.

## 2011-03-28 ENCOUNTER — Telehealth: Payer: Self-pay

## 2011-03-28 MED ORDER — METHYLPHENIDATE HCL ER (OSM) 27 MG PO TBCR: 27.0000 mg | EXTENDED_RELEASE_TABLET | ORAL | Status: DC

## 2011-03-28 NOTE — Telephone Encounter (Signed)
Rx was written for decreased strength on 01/05/11 for 27 mg ( OV on 12/13/10), pt's last RF to be filled 03/05/11.

## 2011-03-28 NOTE — Telephone Encounter (Signed)
.  [facility] PT IN NEED OF HIS CONCERTA . PLEASE CALL [phone]

## 2011-03-28 NOTE — Telephone Encounter (Signed)
Chart is at nurses station MR [id number]

## 2011-03-28 NOTE — Telephone Encounter (Signed)
Rx written and is attached to chart at TL desk. Due for OV 5/13

## 2011-03-29 NOTE — Telephone Encounter (Signed)
Called cell, no VM.  Called home and LM with male for pt to CB.  Rx ready in pickup drawer.

## 2011-03-30 NOTE — Telephone Encounter (Signed)
NO VM ON CELL/LMOM AT HOME THAT RX IS READY FOR PICKUP

## 2011-06-07 ENCOUNTER — Other Ambulatory Visit: Payer: Self-pay | Admitting: Gastroenterology

## 2011-07-06 ENCOUNTER — Ambulatory Visit: Payer: BC Managed Care – PPO

## 2011-07-06 ENCOUNTER — Ambulatory Visit (INDEPENDENT_AMBULATORY_CARE_PROVIDER_SITE_OTHER): Payer: BC Managed Care – PPO | Admitting: Emergency Medicine

## 2011-07-06 VITALS — BP 124/90 | HR 99 | Temp 97.7°F | Resp 20 | Ht 73.0 in | Wt 280.0 lb

## 2011-07-06 DIAGNOSIS — M25579 Pain in unspecified ankle and joints of unspecified foot: Secondary | ICD-10-CM

## 2011-07-06 DIAGNOSIS — M79609 Pain in unspecified limb: Secondary | ICD-10-CM

## 2011-07-06 DIAGNOSIS — M79673 Pain in unspecified foot: Secondary | ICD-10-CM

## 2011-07-06 MED ORDER — HYDROCODONE-ACETAMINOPHEN 5-325 MG PO TABS: 1.0000 | ORAL_TABLET | Freq: Four times a day (QID) | ORAL | Status: AC | PRN

## 2011-07-06 MED ORDER — MELOXICAM 15 MG PO TABS: 15.0000 mg | ORAL_TABLET | Freq: Every day | ORAL | Status: AC

## 2011-07-06 NOTE — Patient Instructions (Signed)
You have been referred back to Dr. Cleophas Dunker for additional evaluation and treatment of your foot/ankle injury.  You may advance weight bearing on the left foot only if you do not have pain.  You may use the Norco (hydrocodone-acetaminophen) at night, and during the day if you are not working.  It can cause sedation, nausea and constipation.

## 2011-07-06 NOTE — Progress Notes (Signed)
  Subjective:    Patient ID: Steve Bass, male    DOB: [birth year], 31 y.o.   MRN: [id number]  HPI Presents with pain and swelling of the left foot and ankle.  He injured this foot 06/2010 and was referred to Ortho.  He's not exactly sure what his injury was.    Yesterday while play in the yard with his dog, he felt "something shift" in his foot.  He heard a "pop" but isn't sure if it was from his foot or from the stick his dog was chewing on.  His pain was a 4/10, and went inside to rest.  This morning his pain is 10/10 and he reports seeing a "flash of pain in front of my eyes" upon weight bearing.     Review of Systems As above.    Objective:   Physical Exam  Constitutional: He is oriented to person, place, and time. He appears well-developed and well-nourished. No distress.       Seated in a wheelchair; a single crutch is against the wall.  HENT:  Head: Normocephalic and atraumatic.  Pulmonary/Chest: Effort normal.  Musculoskeletal: He exhibits edema and tenderness.       Left ankle: He exhibits decreased range of motion. He exhibits no swelling, no ecchymosis, no deformity, no laceration and normal pulse. tenderness. Head of 5th metatarsal tenderness found. No lateral malleolus, no medial malleolus and no proximal fibula tenderness found. Achilles tendon normal.       Feet:  Neurological: He is alert and oriented to person, place, and time.  Skin: Skin is warm, dry and intact. No abrasion, no bruising, no burn, no ecchymosis, no laceration, no lesion and no rash noted. He is not diaphoretic. There is erythema (mild).   [facility] reading (PRIMARY) by  Dr. Cleta Alberts. Soft tissue swelling of the foot.  No fracture seen of the foot or ankle.  Patient has history of foot injury 06/2010.     Assessment & Plan:   1. Pain in foot  DG Foot Complete Left, Ambulatory referral to Orthopedic Surgery, meloxicam (MOBIC) 15 MG tablet, HYDROcodone-acetaminophen (NORCO) 5-325 MG per tablet  2. Ankle pain   DG Ankle Complete Left, Ambulatory referral to Orthopedic Surgery, meloxicam (MOBIC) 15 MG tablet, HYDROcodone-acetaminophen (NORCO) 5-325 MG per tablet   Short CAM walker.  Use crutches, non-weight bearing, advance as tolerated with NO pain.  Discussed with Dr. Cleta Alberts.

## 2011-07-13 ENCOUNTER — Other Ambulatory Visit (INDEPENDENT_AMBULATORY_CARE_PROVIDER_SITE_OTHER): Payer: BC Managed Care – PPO

## 2011-07-13 LAB — HEPATIC FUNCTION PANEL
ALT: 67 U/L — ABNORMAL HIGH (ref 0–53)
Total Bilirubin: 0.5 mg/dL (ref 0.3–1.2)

## 2011-07-17 ENCOUNTER — Other Ambulatory Visit: Payer: Self-pay

## 2011-09-19 ENCOUNTER — Ambulatory Visit (INDEPENDENT_AMBULATORY_CARE_PROVIDER_SITE_OTHER): Payer: BC Managed Care – PPO | Admitting: Internal Medicine

## 2011-09-19 VITALS — BP 130/98 | HR 118 | Temp 98.8°F | Resp 17 | Ht 70.0 in | Wt 294.0 lb

## 2011-09-19 DIAGNOSIS — K122 Cellulitis and abscess of mouth: Secondary | ICD-10-CM

## 2011-09-19 DIAGNOSIS — K089 Disorder of teeth and supporting structures, unspecified: Secondary | ICD-10-CM

## 2011-09-19 DIAGNOSIS — K1379 Other lesions of oral mucosa: Secondary | ICD-10-CM

## 2011-09-19 MED ORDER — AMOXICILLIN 500 MG PO CAPS: 1000.0000 mg | ORAL_CAPSULE | Freq: Two times a day (BID) | ORAL | Status: AC

## 2011-09-19 MED ORDER — HYDROCODONE-ACETAMINOPHEN 5-500 MG PO TABS: 1.0000 | ORAL_TABLET | Freq: Three times a day (TID) | ORAL | Status: AC | PRN

## 2011-09-19 NOTE — Patient Instructions (Addendum)
Dental Pain  A tooth ache may be caused by cavities (tooth decay). Cavities expose the nerve of the tooth to air and hot or cold temperatures. It may come from an infection or abscess (also called a boil or furuncle) around your tooth. It is also often caused by dental caries (tooth decay). This causes the pain you are having.  DIAGNOSIS   Your caregiver can diagnose this problem by exam.  TREATMENT   · If caused by an infection, it may be treated with medications which kill germs (antibiotics) and pain medications as prescribed by your caregiver. Take medications as directed.  · Only take over-the-counter or prescription medicines for pain, discomfort, or fever as directed by your caregiver.  · Whether the tooth ache today is caused by infection or dental disease, you should see your dentist as soon as possible for further care.  SEEK MEDICAL CARE IF:  The exam and treatment you received today has been provided on an emergency basis only. This is not a substitute for complete medical or dental care. If your problem worsens or new problems (symptoms) appear, and you are unable to meet with your dentist, call or return to this location.  SEEK IMMEDIATE MEDICAL CARE IF:   · You have a fever.  · You develop redness and swelling of your face, jaw, or neck.  · You are unable to open your mouth.  · You have severe pain uncontrolled by pain medicine.  MAKE SURE YOU:   · Understand these instructions.  · Will watch your condition.  · Will get help right away if you are not doing well or get worse.  Document Released: 01/02/2005 Document Revised: 12/22/2010 Document Reviewed: 08/21/2007  ExitCare® Patient Information ©2012 [employer].

## 2011-09-19 NOTE — Progress Notes (Signed)
  Subjective:    Patient ID: Steve Bass, male    DOB: 1980-04-25, 31 y.o.   MRN: [id number]  HPI Has injured and abscessed tooth. Lots of pain for 2 days   Review of Systems     Objective:   Physical Exam Right upper molar/gums red inflammed       Assessment & Plan:  Amoxil/vicodin See dentist

## 2011-11-06 ENCOUNTER — Ambulatory Visit (INDEPENDENT_AMBULATORY_CARE_PROVIDER_SITE_OTHER): Payer: BC Managed Care – PPO | Admitting: Family Medicine

## 2011-11-06 VITALS — BP 134/82 | HR 106 | Temp 98.1°F | Resp 16 | Ht 70.38 in | Wt 298.2 lb

## 2011-11-06 DIAGNOSIS — F988 Other specified behavioral and emotional disorders with onset usually occurring in childhood and adolescence: Secondary | ICD-10-CM

## 2011-11-06 NOTE — Progress Notes (Signed)
  Subjective:    Patient ID: Steve Bass, male    DOB: [birth year], 31 y.o.   MRN: [id number]  HPI Pt is here for refill on his concerta - he last received it from [place] in March 2012.  He is in college with a very difficult major ([employer]?)  He is noticing his grades getting progressively worse, slipping. He can't focus on anything - just staring at the ceiling. He requests a refill on the concerta that he was taking previously - last received here on March 2013.    I ran pt through the drug database and saw that he had filled Adderall prescriptions from Dr. Dub Mikes regularly - last on 10/18/11 for Adderall 20mg  tid - #90/mo.  Pt states that he last saw Dr. Dub Mikes about 2 mos ago.  Both concerta and the adderall were subtle but the Adderrall XR lasted longer.  Given the choice - he would like to restart on concerta.  States he ran out of his adderall a few days ago and needs a refill. Would like to continue his ADHD care with Dr. Dub Mikes but it can take sev wks to get an appt.  I then informed the pt that I would call his pharmacy - [facility] in [place], [place] - and he states that actually - he'd forgotten - but he had left his medication in a hotel when he was at the beach and called back but they didn't have it.  Past Medical History  Diagnosis Date  . ADD (attention deficit disorder with hyperactivity)   . Anxiety and depression   . Shingles   . GERD (gastroesophageal reflux disease)     LA classification Grade C EE  . PTSD (post-traumatic stress disorder)   . Obesity     Review of Systems  Constitutional: Positive for fatigue. Negative for fever and chills.  Psychiatric/Behavioral: Positive for decreased concentration. Negative for confusion and agitation. The patient is nervous/anxious. The patient is not hyperactive.       BP 134/82  Pulse 106  Temp 98.1 F (36.7 C) (Oral)  Resp 16  Ht 5' 10.38" (1.788 m)  Wt 298 lb 3.2 oz (135.263 kg)  BMI 42.33 kg/m2  SpO2 96%  Objective:     Physical Exam  Constitutional: He is oriented to person, place, and time. He appears well-developed and well-nourished. No distress.  HENT:  Head: Normocephalic and atraumatic.  Pulmonary/Chest: Effort normal.  Neurological: He is alert and oriented to person, place, and time.  Skin: Skin is warm. He is not diaphoretic.  Psychiatric: He has a normal mood and affect. His behavior is normal.      Assessment & Plan:  1. ADD - Pt would like to continue his care through Dr. Dub Mikes. Agrees to call Dr. Ree Shay office about need for refill, lost medication.  Will send note to Dr. Runell Gess office as Lorain Childes.

## 2012-05-15 ENCOUNTER — Encounter: Payer: Self-pay | Admitting: Family Medicine

## 2012-05-15 ENCOUNTER — Ambulatory Visit: Payer: BC Managed Care – PPO

## 2012-05-15 ENCOUNTER — Ambulatory Visit (INDEPENDENT_AMBULATORY_CARE_PROVIDER_SITE_OTHER): Payer: BC Managed Care – PPO | Admitting: Family Medicine

## 2012-05-15 VITALS — BP 131/81 | HR 125 | Temp 98.2°F | Resp 20 | Ht 74.0 in | Wt 320.0 lb

## 2012-05-15 DIAGNOSIS — M549 Dorsalgia, unspecified: Secondary | ICD-10-CM

## 2012-05-15 MED ORDER — CYCLOBENZAPRINE HCL 10 MG PO TABS: 10.0000 mg | ORAL_TABLET | Freq: Three times a day (TID) | ORAL | Status: DC | PRN

## 2012-05-15 MED ORDER — PREDNISONE 50 MG PO TABS: ORAL_TABLET | ORAL | Status: DC

## 2012-05-15 MED ORDER — TRAMADOL HCL 50 MG PO TABS: 50.0000 mg | ORAL_TABLET | Freq: Every evening | ORAL | Status: DC | PRN
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
## 2012-05-15 NOTE — Patient Instructions (Addendum)
Very nice to meet you.  I think you have a very bad muscle spasm.  I am givging you three different medications First one is called prednisone. Take one pill daily for 5 days.  A second medication is called tramadol. He can take one pill at night. The last medicine is called Flexeril.Take one pill up to 3 times daily s needed. Start the exercises in 48 hours.  Come back in 72 hours if you are not getting better.

## 2012-05-27 ENCOUNTER — Other Ambulatory Visit (INDEPENDENT_AMBULATORY_CARE_PROVIDER_SITE_OTHER): Payer: BC Managed Care – PPO

## 2012-05-27 LAB — HEPATIC FUNCTION PANEL
Albumin: 3.9 g/dL (ref 3.5–5.2)
Alkaline Phosphatase: 54 U/L (ref 39–117)

## 2012-05-27 LAB — PROTIME-INR
INR: 1 ratio (ref 0.8–1.0)
Prothrombin Time: 10.7 s (ref 10.2–12.4)

## 2012-05-28 ENCOUNTER — Other Ambulatory Visit: Payer: Self-pay

## 2012-05-29 ENCOUNTER — Telehealth: Payer: Self-pay

## 2012-05-29 DIAGNOSIS — K449 Diaphragmatic hernia without obstruction or gangrene: Secondary | ICD-10-CM

## 2012-05-29 DIAGNOSIS — R079 Chest pain, unspecified: Secondary | ICD-10-CM

## 2012-05-29 DIAGNOSIS — R12 Heartburn: Secondary | ICD-10-CM

## 2012-05-29 NOTE — Telephone Encounter (Signed)
Phone call entered in error

## 2012-06-11 ENCOUNTER — Other Ambulatory Visit: Payer: Self-pay | Admitting: Gastroenterology

## 2012-06-11 NOTE — Telephone Encounter (Signed)
NEEDS OFFICE APPT FOR ANY FURTHER REFILLS!!

## 2012-07-17 ENCOUNTER — Other Ambulatory Visit: Payer: Self-pay | Admitting: Gastroenterology

## 2012-07-22 ENCOUNTER — Telehealth: Payer: Self-pay | Admitting: Gastroenterology

## 2012-07-22 NOTE — Telephone Encounter (Signed)
Called patient's mother back and phone rang with no answer and no voicemail.

## 2012-07-23 MED ORDER — ESOMEPRAZOLE MAGNESIUM 40 MG PO CPDR: DELAYED_RELEASE_CAPSULE | ORAL | Status: DC

## 2012-07-23 NOTE — Telephone Encounter (Signed)
Told mother we cannot fill medication unless patient comes in for a follow up visit. Pt's mother agreed and scheduled appt for 08/06/12 at 3:15pm. Nexium sent to the pharmacy with just one refill.

## 2012-07-24 ENCOUNTER — Ambulatory Visit: Payer: BC Managed Care – PPO | Admitting: Nurse Practitioner

## 2012-08-06 ENCOUNTER — Ambulatory Visit (INDEPENDENT_AMBULATORY_CARE_PROVIDER_SITE_OTHER): Payer: BC Managed Care – PPO | Admitting: Gastroenterology

## 2012-08-06 ENCOUNTER — Encounter: Payer: Self-pay | Admitting: Gastroenterology

## 2012-08-06 VITALS — BP 116/74 | HR 100 | Ht 70.0 in | Wt 319.0 lb

## 2012-08-06 DIAGNOSIS — K219 Gastro-esophageal reflux disease without esophagitis: Secondary | ICD-10-CM

## 2012-08-06 DIAGNOSIS — R7402 Elevation of levels of lactic acid dehydrogenase (LDH): Secondary | ICD-10-CM

## 2012-08-06 MED ORDER — ESOMEPRAZOLE MAGNESIUM 40 MG PO CPDR: DELAYED_RELEASE_CAPSULE | ORAL | Status: DC

## 2012-08-06 NOTE — Progress Notes (Signed)
History of Present Illness: This is a 32 year old male with chronic GERD. He states his symptoms are well-controlled on daily Nexium. If he misses doses he has recurrent symptoms. His last LFTs had completely normalized.  Current Medications, Allergies, Past Medical History, Past Surgical History, Family History and Social History were reviewed in [employer] record. He notes a 20 pound intentional weight loss.  Physical Exam: General: Well developed , well nourished, obese, no acute distress Head: Normocephalic and atraumatic Eyes:  sclerae anicteric, EOMI Ears: Normal auditory acuity Mouth: No deformity or lesions Lungs: Clear throughout to auscultation Heart: Regular rate and rhythm; no murmurs, rubs or bruits Abdomen: Soft, non tender and non distended. No masses, hepatosplenomegaly or hernias noted. Normal Bowel sounds Musculoskeletal: Symmetrical with no gross deformities  Pulses:  Normal pulses noted Extremities: No clubbing, cyanosis, edema or deformities noted Neurological: Alert oriented x 4, grossly nonfocal Psychological:  Alert and cooperative. Normal mood and affect  Assessment and Recommendations:  1. GERD. Continue standard antireflux measures and Nexium 40 mg daily.  2. Obesity. Encouraged an ongoing weight loss program supervised by his primary physician.   3. Elevated LFTs. LFTs had normalized in May 2014. Recheck in one year.

## 2012-08-06 NOTE — Patient Instructions (Signed)
We have sent the following medications to your pharmacy for you to pick up at your convenience:Nexium.   Thank you for choosing me and [facility].  Malcolm T. Stark, Jr., MD., FACG   

## 2012-09-04 ENCOUNTER — Ambulatory Visit: Payer: BC Managed Care – PPO | Admitting: Family Medicine

## 2012-09-04 ENCOUNTER — Encounter: Payer: Self-pay | Admitting: General Practice

## 2012-09-04 ENCOUNTER — Ambulatory Visit (INDEPENDENT_AMBULATORY_CARE_PROVIDER_SITE_OTHER): Payer: BC Managed Care – PPO | Admitting: General Practice

## 2012-09-04 ENCOUNTER — Ambulatory Visit (INDEPENDENT_AMBULATORY_CARE_PROVIDER_SITE_OTHER): Payer: BC Managed Care – PPO

## 2012-09-04 ENCOUNTER — Telehealth: Payer: Self-pay | Admitting: Family Medicine

## 2012-09-04 DIAGNOSIS — T1490XA Injury, unspecified, initial encounter: Secondary | ICD-10-CM

## 2012-09-04 DIAGNOSIS — M79609 Pain in unspecified limb: Secondary | ICD-10-CM

## 2012-09-04 DIAGNOSIS — T148XXA Other injury of unspecified body region, initial encounter: Secondary | ICD-10-CM

## 2012-09-04 MED ORDER — HYDROCODONE-ACETAMINOPHEN 5-300 MG PO TABS: 1.0000 | ORAL_TABLET | Freq: Four times a day (QID) | ORAL | Status: DC | PRN

## 2012-09-04 NOTE — Progress Notes (Addendum)
  Subjective:    Patient ID: Steve Bass, male    DOB: 05/31/1980, 32 y.o.   MRN: [id number]  HPI Patient presents today with complaints of right hand pain. He reports sparing a the gym and made direct contact with partner during martial arts. He reports feeling pain immediately after making contact. He reports onset of pain was last night and gradually worsened. He reports taking ibuprofen and applying ice pack.     Review of Systems  Constitutional: Negative for fever and chills.  Respiratory: Negative for chest tightness and shortness of breath.   Cardiovascular: Negative for chest pain and palpitations.  Musculoskeletal:       Right hand pain   Neurological: Negative for dizziness, weakness and light-headedness.       Objective:   Physical Exam  Constitutional: He is oriented to person, place, and time. He appears well-developed and well-nourished.  Cardiovascular: Normal rate, regular rhythm and normal heart sounds.   Pulmonary/Chest: Effort normal and breath sounds normal.  Musculoskeletal: He exhibits edema and tenderness.  Tenderness and edema noted to right hand, capillary refill less than 3 seconds. No cyanosis  Neurological: He is alert and oriented to person, place, and time.   [facility] reading (PRIMARY) by Coralie Keens, FNP-C, no fracture or dislocation noted to right hand.                                           Assessment & Plan:  1. Painful hand, right - DG Hand Complete Right; Future - Hydrocodone-Acetaminophen 5-300 MG TABS; Take 1 tablet by mouth every 6 (six) hours as needed.  Dispense: 30 each; Refill: 0  2. Soft tissue injury - Hydrocodone-Acetaminophen 5-300 MG TABS; Take 1 tablet by mouth every 6 (six) hours as needed.  Dispense: 30 each; Refill: 0 -information sheet provided and discussed on RICE -Sedation precautions discussed -RTO if symptoms worsen or no improvement -Patient verbalized understanding -Coralie Keens, FNP-C

## 2012-09-04 NOTE — Telephone Encounter (Signed)
Patient injured hand during mixed martial arts class last night.   Appt scheduled for today.  Patient aware.

## 2012-09-04 NOTE — Patient Instructions (Signed)
RICE: Routine Care for Injuries  The routine care of many injuries includes Rest, Ice, Compression, and Elevation (RICE).  HOME CARE INSTRUCTIONS   Rest is needed to allow your body to heal. Routine activities can usually be resumed when comfortable. Injured tendons and bones can take up to 6 weeks to heal. Tendons are the cord-like structures that attach muscle to bone.   Ice following an injury helps keep the swelling down and reduces pain.   Put ice in a plastic bag.   Place a towel between your skin and the bag.   Leave the ice on for 15-20 minutes, 3-4 times a day. Do this while awake, for the first 24 to 48 hours. After that, continue as directed by your caregiver.   Compression helps keep swelling down. It also gives support and helps with discomfort. If an elastic bandage has been applied, it should be removed and reapplied every 3 to 4 hours. It should not be applied tightly, but firmly enough to keep swelling down. Watch fingers or toes for swelling, bluish discoloration, coldness, numbness, or excessive pain. If any of these problems occur, remove the bandage and reapply loosely. Contact your caregiver if these problems continue.   Elevation helps reduce swelling and decreases pain. With extremities, such as the arms, hands, legs, and feet, the injured area should be placed near or above the level of the heart, if possible.  SEEK IMMEDIATE MEDICAL CARE IF:   You have persistent pain and swelling.   You develop redness, numbness, or unexpected weakness.   Your symptoms are getting worse rather than improving after several days.  These symptoms may indicate that further evaluation or further X-rays are needed. Sometimes, X-rays may not show a small broken bone (fracture) until 1 week or 10 days later. Make a follow-up appointment with your caregiver. Ask when your X-ray results will be ready. Make sure you get your X-ray results.  Document Released: 04/16/2000 Document Revised: 03/27/2011 Document  Reviewed: 06/03/2010  ExitCare Patient Information 2014 [employer].

## 2012-09-08 ENCOUNTER — Emergency Department (HOSPITAL_COMMUNITY)
Admission: EM | Admit: 2012-09-08 | Discharge: 2012-09-09 | Disposition: A | Discharge status: Other Acute Inpatient Hospital | Payer: BC Managed Care – PPO | Attending: Emergency Medicine | Admitting: Emergency Medicine

## 2012-09-08 ENCOUNTER — Encounter (HOSPITAL_COMMUNITY): Payer: Self-pay

## 2012-09-08 DIAGNOSIS — K219 Gastro-esophageal reflux disease without esophagitis: Secondary | ICD-10-CM | POA: Insufficient documentation

## 2012-09-08 DIAGNOSIS — IMO0002 Reserved for concepts with insufficient information to code with codable children: Secondary | ICD-10-CM | POA: Insufficient documentation

## 2012-09-08 DIAGNOSIS — Z79899 Other long term (current) drug therapy: Secondary | ICD-10-CM | POA: Insufficient documentation

## 2012-09-08 DIAGNOSIS — E669 Obesity, unspecified: Secondary | ICD-10-CM | POA: Insufficient documentation

## 2012-09-08 DIAGNOSIS — F1410 Cocaine abuse, uncomplicated: Secondary | ICD-10-CM | POA: Insufficient documentation

## 2012-09-08 DIAGNOSIS — R45851 Suicidal ideations: Secondary | ICD-10-CM

## 2012-09-08 DIAGNOSIS — Z8659 Personal history of other mental and behavioral disorders: Secondary | ICD-10-CM | POA: Insufficient documentation

## 2012-09-08 DIAGNOSIS — F1910 Other psychoactive substance abuse, uncomplicated: Secondary | ICD-10-CM

## 2012-09-08 DIAGNOSIS — F1510 Other stimulant abuse, uncomplicated: Secondary | ICD-10-CM | POA: Insufficient documentation

## 2012-09-08 DIAGNOSIS — Z8619 Personal history of other infectious and parasitic diseases: Secondary | ICD-10-CM | POA: Insufficient documentation

## 2012-09-08 DIAGNOSIS — F911 Conduct disorder, childhood-onset type: Secondary | ICD-10-CM | POA: Insufficient documentation

## 2012-09-08 DIAGNOSIS — F341 Dysthymic disorder: Secondary | ICD-10-CM | POA: Insufficient documentation

## 2012-09-08 DIAGNOSIS — F17200 Nicotine dependence, unspecified, uncomplicated: Secondary | ICD-10-CM | POA: Insufficient documentation
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
## 2012-09-09 ENCOUNTER — Encounter (HOSPITAL_COMMUNITY): Payer: Self-pay | Admitting: Behavioral Health

## 2012-09-09 ENCOUNTER — Inpatient Hospital Stay (HOSPITAL_COMMUNITY)
Admission: AD | Admit: 2012-09-09 | Discharge: 2012-09-17 | DRG: 751 | Disposition: A | Discharge status: Home / Self Care | Payer: BC Managed Care – PPO | Source: Intra-hospital | Attending: Psychiatry | Admitting: Psychiatry

## 2012-09-09 ENCOUNTER — Encounter (HOSPITAL_COMMUNITY): Payer: Self-pay | Admitting: Licensed Clinical Social Worker

## 2012-09-09 DIAGNOSIS — F1920 Other psychoactive substance dependence, uncomplicated: Secondary | ICD-10-CM

## 2012-09-09 DIAGNOSIS — F1020 Alcohol dependence, uncomplicated: Secondary | ICD-10-CM | POA: Diagnosis present

## 2012-09-09 DIAGNOSIS — F411 Generalized anxiety disorder: Secondary | ICD-10-CM | POA: Diagnosis present

## 2012-09-09 DIAGNOSIS — F410 Panic disorder [episodic paroxysmal anxiety] without agoraphobia: Secondary | ICD-10-CM

## 2012-09-09 DIAGNOSIS — F39 Unspecified mood [affective] disorder: Secondary | ICD-10-CM

## 2012-09-09 DIAGNOSIS — Z79899 Other long term (current) drug therapy: Secondary | ICD-10-CM

## 2012-09-09 DIAGNOSIS — F10939 Alcohol use, unspecified with withdrawal, unspecified: Principal | ICD-10-CM | POA: Diagnosis present

## 2012-09-09 DIAGNOSIS — F1420 Cocaine dependence, uncomplicated: Secondary | ICD-10-CM | POA: Diagnosis present

## 2012-09-09 DIAGNOSIS — F1524 Other stimulant dependence with stimulant-induced mood disorder: Secondary | ICD-10-CM | POA: Diagnosis present

## 2012-09-09 DIAGNOSIS — F10239 Alcohol dependence with withdrawal, unspecified: Principal | ICD-10-CM | POA: Diagnosis present

## 2012-09-09 DIAGNOSIS — F909 Attention-deficit hyperactivity disorder, unspecified type: Secondary | ICD-10-CM | POA: Diagnosis present

## 2012-09-09 DIAGNOSIS — F1210 Cannabis abuse, uncomplicated: Secondary | ICD-10-CM | POA: Diagnosis present

## 2012-09-09 DIAGNOSIS — F988 Other specified behavioral and emotional disorders with onset usually occurring in childhood and adolescence: Secondary | ICD-10-CM

## 2012-09-09 DIAGNOSIS — F4310 Post-traumatic stress disorder, unspecified: Secondary | ICD-10-CM | POA: Diagnosis present

## 2012-09-09 LAB — BASIC METABOLIC PANEL
BUN: 15 mg/dL (ref 6–23)
Calcium: 9.9 mg/dL (ref 8.4–10.5)
Creatinine, Ser: 1.18 mg/dL (ref 0.50–1.35)
GFR calc non Af Amer: 82 mL/min — ABNORMAL LOW (ref >90)
Glucose, Bld: 96 mg/dL (ref 70–99)

## 2012-09-09 LAB — RAPID URINE DRUG SCREEN, HOSP PERFORMED
Barbiturates: NOT DETECTED
Cocaine: POSITIVE — AB
Tetrahydrocannabinol: POSITIVE — AB

## 2012-09-09 LAB — CBC
HCT: 41.1 % (ref 39.0–52.0)
Hemoglobin: 13.3 g/dL (ref 13.0–17.0)
MCH: 29.4 pg (ref 26.0–34.0)
MCHC: 32.4 g/dL (ref 30.0–36.0)
MCV: 90.9 fL (ref 78.0–100.0)

## 2012-09-09 MED ORDER — CHLORDIAZEPOXIDE HCL 25 MG PO CAPS
25.0000 mg | ORAL_CAPSULE | Freq: Four times a day (QID) | ORAL | Status: AC | PRN
  Administered 2012-09-10 – 2012-09-11 (×5): 25 mg via ORAL
  Filled 2012-09-09 (×5): qty 1

## 2012-09-09 MED ORDER — ALUM & MAG HYDROXIDE-SIMETH 200-200-20 MG/5ML PO SUSP: 30.0000 mL | ORAL | Status: DC | PRN

## 2012-09-09 MED ORDER — GABAPENTIN 300 MG PO CAPS
600.0000 mg | ORAL_CAPSULE | Freq: Three times a day (TID) | ORAL | Status: DC
  Administered 2012-09-09 (×2): 600 mg via ORAL
  Filled 2012-09-09 (×5): qty 2

## 2012-09-09 MED ORDER — MAGNESIUM HYDROXIDE 400 MG/5ML PO SUSP: 30.0000 mL | Freq: Every day | ORAL | Status: DC | PRN

## 2012-09-09 MED ORDER — TRAZODONE HCL 100 MG PO TABS
100.0000 mg | ORAL_TABLET | Freq: Every day | ORAL | Status: DC
  Administered 2012-09-09: 100 mg via ORAL
  Filled 2012-09-09 (×4): qty 1

## 2012-09-09 MED ORDER — GABAPENTIN 600 MG PO TABS
600.0000 mg | ORAL_TABLET | Freq: Three times a day (TID) | ORAL | Status: DC
  Administered 2012-09-10 – 2012-09-11 (×5): 600 mg via ORAL
  Filled 2012-09-09 (×7): qty 1

## 2012-09-09 MED ORDER — CHLORDIAZEPOXIDE HCL 25 MG PO CAPS
25.0000 mg | ORAL_CAPSULE | Freq: Every day | ORAL | Status: AC
  Administered 2012-09-14: 25 mg via ORAL
  Filled 2012-09-09: qty 1

## 2012-09-09 MED ORDER — CHLORDIAZEPOXIDE HCL 25 MG PO CAPS
25.0000 mg | ORAL_CAPSULE | Freq: Three times a day (TID) | ORAL | Status: AC
  Administered 2012-09-11 – 2012-09-12 (×3): 25 mg via ORAL
  Filled 2012-09-09 (×4): qty 1

## 2012-09-09 MED ORDER — ACETAMINOPHEN 325 MG PO TABS
650.0000 mg | ORAL_TABLET | Freq: Four times a day (QID) | ORAL | Status: DC | PRN
  Administered 2012-09-11: 650 mg via ORAL

## 2012-09-09 MED ORDER — PANTOPRAZOLE SODIUM 40 MG PO TBEC: 40.0000 mg | DELAYED_RELEASE_TABLET | Freq: Every day | ORAL | Status: DC

## 2012-09-09 MED ORDER — TRAZODONE HCL 50 MG PO TABS
ORAL_TABLET | ORAL | Status: AC
  Filled 2012-09-09: qty 2

## 2012-09-09 MED ORDER — TRAZODONE HCL 50 MG PO TABS
100.0000 mg | ORAL_TABLET | Freq: Every day | ORAL | Status: DC
  Administered 2012-09-09: 100 mg via ORAL
  Filled 2012-09-09 (×2): qty 1

## 2012-09-09 MED ORDER — NICOTINE 21 MG/24HR TD PT24
21.0000 mg | MEDICATED_PATCH | Freq: Once | TRANSDERMAL | Status: DC
  Administered 2012-09-09: 21 mg via TRANSDERMAL
  Filled 2012-09-09: qty 1

## 2012-09-09 MED ORDER — PANTOPRAZOLE SODIUM 40 MG PO TBEC
40.0000 mg | DELAYED_RELEASE_TABLET | Freq: Every day | ORAL | Status: DC
  Administered 2012-09-10 – 2012-09-17 (×8): 40 mg via ORAL
  Filled 2012-09-09 (×9): qty 1

## 2012-09-09 MED ORDER — CHLORDIAZEPOXIDE HCL 25 MG PO CAPS
25.0000 mg | ORAL_CAPSULE | ORAL | Status: AC
  Administered 2012-09-12 – 2012-09-13 (×2): 25 mg via ORAL
  Filled 2012-09-09 (×2): qty 1

## 2012-09-09 MED ORDER — THIAMINE HCL 100 MG/ML IJ SOLN
100.0000 mg | Freq: Once | INTRAMUSCULAR | Status: AC
  Administered 2012-09-09: 100 mg via INTRAMUSCULAR

## 2012-09-09 MED ORDER — ONDANSETRON 4 MG PO TBDP: 4.0000 mg | ORAL_TABLET | Freq: Four times a day (QID) | ORAL | Status: AC | PRN

## 2012-09-09 MED ORDER — LOPERAMIDE HCL 2 MG PO CAPS: 2.0000 mg | ORAL_CAPSULE | ORAL | Status: AC | PRN

## 2012-09-09 MED ORDER — VITAMIN B-1 100 MG PO TABS
100.0000 mg | ORAL_TABLET | Freq: Every day | ORAL | Status: DC
  Administered 2012-09-10 – 2012-09-17 (×8): 100 mg via ORAL
  Filled 2012-09-09 (×9): qty 1

## 2012-09-09 MED ORDER — PANTOPRAZOLE SODIUM 40 MG PO TBEC
DELAYED_RELEASE_TABLET | ORAL | Status: AC
  Administered 2012-09-09: 40 mg via ORAL
  Filled 2012-09-09: qty 1

## 2012-09-09 MED ORDER — AMPHETAMINE-DEXTROAMPHET ER 20 MG PO CP24: 20.0000 mg | ORAL_CAPSULE | Freq: Three times a day (TID) | ORAL | Status: DC

## 2012-09-09 MED ORDER — ADULT MULTIVITAMIN W/MINERALS CH
1.0000 | ORAL_TABLET | Freq: Every day | ORAL | Status: DC
  Administered 2012-09-10 – 2012-09-17 (×8): 1 via ORAL
  Filled 2012-09-09 (×9): qty 1

## 2012-09-09 MED ORDER — DULOXETINE HCL 60 MG PO CPEP
60.0000 mg | ORAL_CAPSULE | Freq: Every day | ORAL | Status: DC
  Administered 2012-09-09: 60 mg via ORAL
  Filled 2012-09-09 (×2): qty 1

## 2012-09-09 MED ORDER — DULOXETINE HCL 60 MG PO CPEP
60.0000 mg | ORAL_CAPSULE | Freq: Every day | ORAL | Status: DC
  Administered 2012-09-10 – 2012-09-11 (×2): 60 mg via ORAL
  Filled 2012-09-09 (×3): qty 1

## 2012-09-09 MED ORDER — HYDROXYZINE HCL 25 MG PO TABS
25.0000 mg | ORAL_TABLET | Freq: Four times a day (QID) | ORAL | Status: AC | PRN
  Administered 2012-09-09 – 2012-09-12 (×8): 25 mg via ORAL
  Filled 2012-09-09 (×2): qty 1

## 2012-09-09 MED ORDER — CHLORDIAZEPOXIDE HCL 25 MG PO CAPS
25.0000 mg | ORAL_CAPSULE | Freq: Four times a day (QID) | ORAL | Status: AC
  Administered 2012-09-09 – 2012-09-11 (×6): 25 mg via ORAL
  Filled 2012-09-09 (×5): qty 1

## 2012-09-09 NOTE — Tx Team (Signed)
Initial Interdisciplinary Treatment Plan  PATIENT STRENGTHS: (choose at least two) Ability for insight Active sense of humor General fund of knowledge Motivation for treatment/growth Religious Affiliation Supportive family/friends  PATIENT STRESSORS: Financial difficulties Legal issue Marital or family conflict Substance abuse Traumatic event   PROBLEM LIST: Problem List/Patient Goals Date to be addressed Date deferred Reason deferred Estimated date of resolution  Substance Abuse 09/09/2012      anxiety 09/09/2012                                                DISCHARGE CRITERIA:  Ability to meet basic life and health needs Adequate post-discharge living arrangements Improved stabilization in mood, thinking, and/or behavior Motivation to continue treatment in a less acute level of care Reduction of life-threatening or endangering symptoms to within safe limits Safe-care adequate arrangements made  PRELIMINARY DISCHARGE PLAN: Attend aftercare/continuing care group Attend PHP/IOP Outpatient therapy Participate in family therapy Return to previous living arrangement  PATIENT/FAMIILY INVOLVEMENT: This treatment plan has been presented to and reviewed with the patient, Steve Bass, and/or family member.  The patient and family have been given the opportunity to ask questions and make suggestions.  Angeline Slim M 09/09/2012, 9:21 PM

## 2012-09-09 NOTE — ED Notes (Signed)
Patient with no complaints at this time. Respirations even and unlabored. Skin warm/dry. Discharge instructions reviewed with patient at this time. Patient given opportunity to voice concerns/ask questions. Report called to [facility].  Patient discharged at this time and left Emergency Department with [facility] police sheriff.
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
## 2012-09-09 NOTE — ED Notes (Signed)
Mother phoned.  Updated on condition.

## 2012-09-09 NOTE — ED Notes (Signed)
Pt ambulatory to the bathroom with officer 

## 2012-09-09 NOTE — ED Notes (Signed)
Report called to [place], RN at [facility] on [address].  Patient ready to transport via Industrial/product [employer].

## 2012-09-09 NOTE — BH Assessment (Signed)
Gave clinical report to Dr. Geoffery Lyons. Pt is accepted to [facility] pending a 300-hall bed.  Harlin Rain Ria Comment, [facility] Triage Specialist
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
## 2012-09-09 NOTE — ED Notes (Signed)
IVC papers completed! 
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
## 2012-09-09 NOTE — BHH Counselor (Signed)
Writer spoke to the nurse working with the patient Jacki Cones).  Writer informed the nurse that the consent to release information form as well as the IVC paperwork needs to be faxed to [facility] at [phone]).    The nurse also informed me that the patient is IVC'd and she will need to contact the [facility] in order to have the patient transported to [facility].
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
## 2012-09-09 NOTE — ED Notes (Signed)
Tele psy complete, Ala Dach called to speak with parents, advised on conversation parents had with myself and physician. Ala Dach will work on Education officer, museum.
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
## 2012-09-09 NOTE — ED Notes (Signed)
Notified patient and his mother that bed will be available at [facility] after 1400 today.
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
## 2012-09-09 NOTE — ED Notes (Signed)
Spoke with Idalia Needle at [facility]. No bed available at present
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
## 2012-09-10 ENCOUNTER — Encounter (HOSPITAL_COMMUNITY): Payer: Self-pay | Admitting: Psychiatry

## 2012-09-10 DIAGNOSIS — F410 Panic disorder [episodic paroxysmal anxiety] without agoraphobia: Secondary | ICD-10-CM | POA: Diagnosis present

## 2012-09-10 DIAGNOSIS — F411 Generalized anxiety disorder: Secondary | ICD-10-CM

## 2012-09-10 DIAGNOSIS — F1920 Other psychoactive substance dependence, uncomplicated: Secondary | ICD-10-CM | POA: Diagnosis present

## 2012-09-10 DIAGNOSIS — F4310 Post-traumatic stress disorder, unspecified: Secondary | ICD-10-CM | POA: Diagnosis present

## 2012-09-10 DIAGNOSIS — F909 Attention-deficit hyperactivity disorder, unspecified type: Secondary | ICD-10-CM | POA: Diagnosis present

## 2012-09-10 DIAGNOSIS — F1524 Other stimulant dependence with stimulant-induced mood disorder: Secondary | ICD-10-CM | POA: Diagnosis present

## 2012-09-10 DIAGNOSIS — F39 Unspecified mood [affective] disorder: Secondary | ICD-10-CM

## 2012-09-10 MED ORDER — NICOTINE 21 MG/24HR TD PT24
21.0000 mg | MEDICATED_PATCH | Freq: Every day | TRANSDERMAL | Status: DC
  Administered 2012-09-10 – 2012-09-17 (×8): 21 mg via TRANSDERMAL
  Filled 2012-09-10 (×9): qty 1

## 2012-09-10 MED ORDER — TRAZODONE HCL 100 MG PO TABS
200.0000 mg | ORAL_TABLET | Freq: Every day | ORAL | Status: DC
  Administered 2012-09-10 – 2012-09-16 (×7): 200 mg via ORAL
  Filled 2012-09-10 (×8): qty 2

## 2012-09-10 NOTE — Progress Notes (Signed)
Recreation Therapy Notes  Date: 08.26.2014 Time: 2:30pm Location: [address] Dayroom  Group Topic: Animal Assisted Activities  Goal Area(s) Addresses:  Patient will interact appropriately with dog team.    Behavioral Response: Appropriate, Attentive, Engaged  Education: Coping Skill   Education Outcome: Acknowledges understanding  Clinical Observations/Feedback: Dog Team: [employer]. Patient interacted appropriately with peer, dog team and LRT.    Marykay Lex Zilphia Kozinski, LRT/CTRS  Keandrea Tapley L 09/10/2012 4:40 PM
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
## 2012-09-10 NOTE — Progress Notes (Signed)
Recreation Therapy Notes  Date: 08.26.2014 Time: 3:00pm Location: [address] Dayroom  Group Topic: Communication, Team Building, Problem Solving  Goal Area(s) Addresses:  Patient will be able to recognize use of communication, team building and problem solving during course of group activity.  Patient will verbalize qualities used to make decisions during group session.  Patient will verbalize ability to use skills to build healthy support system post d/c.   Behavioral Response: Appropriate, Attentive, Engaged, Supportive  Intervention: Problem Solving Activity.  Activity: Life Boat. Patients were given a scenario about being caught on a sinking ship. In this scenario patients were informed all members of group session, in addition to 8 individuals off of a list of 15 could fit on the life boat. List of individuals included people from all socioeconomic status, for example: President Obama, Midwife, Runner, broadcasting/film/video, Education officer, museum.    Education: LIfe Skills, Discharge Planning   Education Outcome: Acknowledges understanding  Clinical Observations/Feedback: Patient contributed to opening contributing to group definition of recovery. Patient actively participated in group activity, giving justification for individuals he wanted to put on life boat, as well as debating with peer appropriately. Patient contributed to wrap up discussion, identifying empathy and survival instinct as qualities needed for decision making process. Patient identified communication as a necessary skill to make activity a success. In addition to contributing to wrap up discussion patient actively listened to peer share personal stories related to group topic as well as offer support when needed.    Marykay Lex Coye Dawood, LRT/CTRS  Gilmore List L 09/10/2012 4:16 PM

## 2012-09-10 NOTE — [facility] Group Notes (Signed)
  [facility] LCSW Group Therapy  09/10/2012  1:15 PM   Type of Therapy:  Group Therapy  Participation Level:  Active  Participation Quality:  Appropriate and Attentive  Affect:  Appropriate  Cognitive:  Alert and Appropriate  Insight:  Developing/Improving and Engaged  Engagement in Therapy:  Developing/Improving and Engaged  Modes of Intervention:  Activity, Clarification, Confrontation, Discussion, Education, Exploration, Limit-setting, Orientation, Problem-solving, Rapport Building, Dance movement psychotherapist, Socialization and Support  Summary of Progress/Problems: Patient was attentive and engaged with speaker from [facility].  Patient was attentive to speaker while they shared their story of dealing with mental health and overcoming it.  Patient expressed interest in their programs and services and received information on their agency.  Patient processed ways they can relate to the speaker.     Reyes Ivan, LCSWA 09/10/2012 1:17 PM

## 2012-09-10 NOTE — Progress Notes (Signed)
Pt. Still remains anxious on the unit.  Writer gave pt. A librium for withdrawals and anxiety which reduced the anxiety.  Pt. Reports to writer that he will be going to a 28 day treatment program when discharged from [facility].  Pt. Denies SI and HI, no A or VH and no complaints of pain or discomfort noted.

## 2012-09-10 NOTE — Progress Notes (Signed)
Patient ID: Steve Bass, male   DOB: Jun 24, 1980, 32 y.o.   MRN: [id number] He has been up and to groups interacting with peers and staff. Requested and received prn for Withdrawal symptoms of anxiety.  He has been calm and cooperative.
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
## 2012-09-10 NOTE — Progress Notes (Signed)
Patient did not attend 0845 [facility]. 

## 2012-09-11 DIAGNOSIS — F4310 Post-traumatic stress disorder, unspecified: Secondary | ICD-10-CM

## 2012-09-11 DIAGNOSIS — F1994 Other psychoactive substance use, unspecified with psychoactive substance-induced mood disorder: Secondary | ICD-10-CM

## 2012-09-11 DIAGNOSIS — F909 Attention-deficit hyperactivity disorder, unspecified type: Secondary | ICD-10-CM

## 2012-09-11 MED ORDER — BUPROPION HCL ER (XL) 150 MG PO TB24
150.0000 mg | ORAL_TABLET | Freq: Every day | ORAL | Status: DC
  Administered 2012-09-11 – 2012-09-13 (×3): 150 mg via ORAL
  Filled 2012-09-11 (×6): qty 1

## 2012-09-11 MED ORDER — GABAPENTIN 300 MG PO CAPS
300.0000 mg | ORAL_CAPSULE | Freq: Three times a day (TID) | ORAL | Status: DC
  Administered 2012-09-11 – 2012-09-17 (×17): 300 mg via ORAL
  Filled 2012-09-11 (×20): qty 1

## 2012-09-11 MED ORDER — FINASTERIDE 1 MG PO TABS
1.0000 mg | ORAL_TABLET | Freq: Every day | ORAL | Status: DC
  Administered 2012-09-12 – 2012-09-16 (×5): 1 mg via ORAL

## 2012-09-11 MED ORDER — DULOXETINE HCL 30 MG PO CPEP
30.0000 mg | ORAL_CAPSULE | Freq: Every day | ORAL | Status: DC
  Administered 2012-09-12: 30 mg via ORAL
  Filled 2012-09-11 (×3): qty 1

## 2012-09-11 NOTE — Progress Notes (Signed)
Didn't attend NA 

## 2012-09-11 NOTE — Progress Notes (Signed)
[facility] MD Progress Note  09/11/2012 3:20 PM Steve Bass  MRN:  [id number] Subjective:  Continues to have a hard time. Dealing with a lot of emotions, regrets. More aware of the fact that he could have hurt his parents. Uncomfortable expressing all the emotions (tearful). Apologizes. Recognizes that he has been holding a lot of stuff inside. He is committed to going to rehab and make things work for himself. He is sharing other traumatic event in his life that he has kept inside Diagnosis:   DSM5: Schizophrenia Disorders:   Obsessive-Compulsive Disorders:   Trauma-Stressor Disorders:  Posttraumatic Stress Disorder (309.81) Substance/Addictive Disorders:  Alcohol Related Disorder - Severe (303.90), Cannabis Use Disorder - Moderate 9304.30) and Opioid Disorder - Moderate (304.00), Cocaine abuse Depressive Disorders:  Major Depression   Axis I: ADHD, combined type, Mood Disorder NOS, Post Traumatic Stress Disorder and Substance Induced Mood Disorder  ADL's:  Intact  Sleep: Fair  Appetite:  Fair  Suicidal Ideation:  Plan:  denies Intent:  denies Means:  denies Homicidal Ideation:  Plan:  denies Intent:  denies Means:  denies AEB (as evidenced by):  Psychiatric Specialty Exam: Review of Systems  Constitutional: Negative.   HENT: Negative.   Eyes: Negative.   Respiratory: Negative.   Cardiovascular: Negative.   Gastrointestinal: Negative.   Genitourinary: Negative.   Musculoskeletal: Negative.   Skin: Negative.   Neurological: Negative.   Endo/Heme/Allergies: Negative.   Psychiatric/Behavioral: Positive for depression and substance abuse. The patient is nervous/anxious.     Blood pressure 152/90, pulse 88, temperature 97.4 F (36.3 C), temperature source Oral, resp. rate 20, height 6' (1.829 m), weight 140.615 kg (310 lb).Body mass index is 42.03 kg/(m^2).  General Appearance: Fairly Groomed  Patent attorney::  Fair  Speech:  Clear and Coherent and Slow  Volume:  Decreased   Mood:  Anxious, Depressed, Dysphoric and Worthless  Affect:  Tearful and sad, anxious, worried  Thought Process:  Coherent and Goal Directed  Orientation:  Full (Time, Place, and Person)  Thought Content:  worries, concerns, fears, regrets  Suicidal Thoughts:  No  Homicidal Thoughts:  No  Memory:  Immediate;   Fair Recent;   Fair Remote;   Fair  Judgement:  Fair  Insight:  Present  Psychomotor Activity:  Restlessness  Concentration:  Fair  Recall:  Fair  Akathisia:  No  Handed:  Right  AIMS (if indicated):     Assets:  Desire for Improvement Social Support  Sleep:  Number of Hours: 6.25   Current Medications: Current Facility-Administered Medications  Medication Dose Route Frequency Provider Last Rate Last Dose  . acetaminophen (TYLENOL) tablet 650 mg  650 mg Oral Q6H PRN Kerry Hough, PA-C   650 mg at 09/11/12 1058  . alum & mag hydroxide-simeth (MAALOX/MYLANTA) 200-200-20 MG/5ML suspension 30 mL  30 mL Oral Q4H PRN Kerry Hough, PA-C      . buPROPion (WELLBUTRIN XL) 24 hr tablet 150 mg  150 mg Oral Daily Rachael Fee, MD      . chlordiazePOXIDE (LIBRIUM) capsule 25 mg  25 mg Oral Q6H PRN Kerry Hough, PA-C   25 mg at 09/11/12 7829  . chlordiazePOXIDE (LIBRIUM) capsule 25 mg  25 mg Oral TID Kerry Hough, PA-C   25 mg at 09/11/12 1202   Followed by  . [START ON 09/12/2012] chlordiazePOXIDE (LIBRIUM) capsule 25 mg  25 mg Oral BH-qamhs Spencer E Simon, PA-C       Followed by  . [START  ON 09/14/2012] chlordiazePOXIDE (LIBRIUM) capsule 25 mg  25 mg Oral Daily Kerry Hough, PA-C      . [START ON 09/12/2012] DULoxetine (CYMBALTA) DR capsule 30 mg  30 mg Oral Daily Rachael Fee, MD      . gabapentin (NEURONTIN) capsule 300 mg  300 mg Oral TID Rachael Fee, MD      . hydrOXYzine (ATARAX/VISTARIL) tablet 25 mg  25 mg Oral Q6H PRN Kerry Hough, PA-C   25 mg at 09/11/12 1610  . loperamide (IMODIUM) capsule 2-4 mg  2-4 mg Oral PRN Kerry Hough, PA-C      . magnesium  hydroxide (MILK OF MAGNESIA) suspension 30 mL  30 mL Oral Daily PRN Kerry Hough, PA-C      . multivitamin with minerals tablet 1 tablet  1 tablet Oral Daily Kerry Hough, PA-C   1 tablet at 09/11/12 0744  . nicotine (NICODERM CQ - dosed in mg/24 hours) patch 21 mg  21 mg Transdermal Daily Kerry Hough, PA-C   21 mg at 09/11/12 0753  . ondansetron (ZOFRAN-ODT) disintegrating tablet 4 mg  4 mg Oral Q6H PRN Kerry Hough, PA-C      . pantoprazole (PROTONIX) EC tablet 40 mg  40 mg Oral Daily Kerry Hough, PA-C   40 mg at 09/11/12 0744  . thiamine (VITAMIN B-1) tablet 100 mg  100 mg Oral Daily Kerry Hough, PA-C   100 mg at 09/11/12 0744  . traZODone (DESYREL) tablet 200 mg  200 mg Oral QHS Rachael Fee, MD   200 mg at 09/10/12 2249    Lab Results: No results found for this or any previous visit (from the past 48 hour(s)).  Physical Findings: AIMS: Facial and Oral Movements Muscles of Facial Expression: None, normal Lips and Perioral Area: None, normal Jaw: None, normal Tongue: None, normal,Extremity Movements Upper (arms, wrists, hands, fingers): None, normal Lower (legs, knees, ankles, toes): None, normal, Trunk Movements Neck, shoulders, hips: None, normal, Overall Severity Severity of abnormal movements (highest score from questions above): None, normal Incapacitation due to abnormal movements: None, normal Patient's awareness of abnormal movements (rate only patient's report): No Awareness, Dental Status Current problems with teeth and/or dentures?: No Does patient usually wear dentures?: No  CIWA:  CIWA-Ar Total: 0 COWS:  COWS Total Score: 1  Treatment Plan Summary: Daily contact with patient to assess and evaluate symptoms and progress in treatment Medication management  Plan: Supportive approach/coping skills/relapse prevention           Continue Detox           Reassess co morbities/optimize treatment           Wellbutrin XL 150 mg in AM           Decrease  the Cymbalta to 30 mg daily           Decrease the Neurontin to 300 mg TID           Resume Trazodone HS  Medical Decision Making Problem Points:  Review of last therapy session (1) and Review of psycho-social stressors (1) Data Points:  Review of medication regiment & side effects (2) Review of new medications or change in dosage (2)  I certify that inpatient services furnished can reasonably be expected to improve the patient's condition.   Iori Gigante A 09/11/2012, 3:20 PM

## 2012-09-11 NOTE — Progress Notes (Signed)
Patient ID: Steve Bass, male   DOB: [birth year], 32 y.o.   MRN: [id number] He has been up and to group to day interacting with peers and staff. He has denied Withdrawal symptoms says that he is feeling better.

## 2012-09-11 NOTE — Tx Team (Signed)
Interdisciplinary Treatment Plan Update (Adult)  Date: 09/11/2012  Time Reviewed:  9:45 AM  Progress in Treatment: Attending groups: Yes Participating in groups:  Yes Taking medication as prescribed:  Yes Tolerating medication:  Yes Family/Significant othe contact made: CSW attempting Patient understands diagnosis:  Yes Discussing patient identified problems/goals with staff:  Yes Medical problems stabilized or resolved:  Yes Denies suicidal/homicidal ideation: Yes Issues/concerns per patient self-inventory:  Yes Other:  New problem(s) identified: N/A  Discharge Plan or Barriers: CSW assessing for appropriate referrals.  Pt is open to going to [facility] [place] for further treatment.  Reason for Continuation of Hospitalization: Anxiety Depression Detox Medication Stabilization  Comments: N/A  Estimated length of stay: 3-5 days  For review of initial/current patient goals, please see plan of care.  Attendees: Patient:     Family:     Physician:  Dr. Dub Mikes 09/11/2012 10:31 AM   Nursing:   Manuela Schwartz, RN 09/11/2012 10:31 AM   Clinical Social Worker:  Reyes Ivan, LCSWA 09/11/2012 10:31 AM   Other: Burnetta Sabin, RN 09/11/2012 10:31 AM   Other:  Trula Slade, LCSWA 09/11/2012 10:31 AM   Other:  Serena Colonel, NP 09/11/2012 10:32 AM   Other:  Roswell Miners, RN 09/11/2012 10:32 AM   Other: Onnie Boer, RN care manager 09/11/2012 10:32 AM   Other:    Other:    Other:    Other:    Other:     Scribe for Treatment Team:   Carmina Miller, 09/11/2012 , 10:31 AM

## 2012-09-11 NOTE — Progress Notes (Signed)
D:PAtient in the hallway on approach.  Patient states she is feeling ok.  Patient states he is happy because he is going to a 28 day program in [place].  Patient states he has to get his life on track because if he doesn't he will end up dead.  Patient denies SI/HI and denies AVH.   A: Staff to monitor Q 15 mins for safety.  Encouragement and support offered.  Scheduled medications administered per orders. R: Patient remains safe on the unit.  Patient did not attend group tonight.  Patient cooperative and calm.  Patient taking administered medications.  Patient visible on the unit and interacting with peers.

## 2012-09-11 NOTE — [facility] Group Notes (Signed)
[facility] LCSW Aftercare Discharge Planning Group Note   09/11/2012  8:45 AM  Participation Quality:  Alert and Appropriate   Mood/Affect:  Appropriate and Calm  Depression Rating:  0  Anxiety Rating:  1  Thoughts of Suicide:  Pt denies SI/HI  Will you contract for safety?   Yes  Current AVH:  Pt denies  Plan for Discharge/Comments:  Pt attended discharge planning group and actively participated in group.  CSW provided pt with today's workbook.  Pt states that he is doing well.  Pt decided to go for further treatment at [facility].  CSW will make this referral and arrange for pt to transfer there.  No further needs voiced by pt at this time.    Transportation Means: Pt reports access to transportation - pt states that family will transport him to [employer]: No supports mentioned at this time  Reyes Ivan, LCSWA 09/11/2012 9:42 AM

## 2012-09-11 NOTE — [facility] Suicide Risk Assessment (Signed)
[facility] INPATIENT:  Family/Significant Other Suicide Prevention Education  Suicide Prevention Education:  Education Completed; nasif bos - mother [id number]),  (name of family member/significant other) has been identified by the patient as the family member/significant other with whom the patient will be residing, and identified as the person(s) who will aid the patient in the event of a mental health crisis (suicidal ideations/suicide attempt).  With written consent from the patient, the family member/significant other has been provided the following suicide prevention education, prior to the and/or following the discharge of the patient.  The suicide prevention education provided includes the following:  Suicide risk factors  Suicide prevention and interventions  National Suicide Hotline telephone number  [facility] assessment telephone number  [facility] Emergency Assistance 911  [facility] and/or Residential Mobile Crisis Unit telephone number  Request made of family/significant other to:  Remove weapons (e.g., guns, rifles, knives), all items previously/currently identified as safety concern.    Remove drugs/medications (over-the-counter, prescriptions, illicit drugs), all items previously/currently identified as a safety concern.  The family member/significant other verbalizes understanding of the suicide prevention education information provided.  The family member/significant other agrees to remove the items of safety concern listed above.  Mother states that she is on board with whatever Dr. Dub Mikes suggests for treatment.  Mother states that she is willing to pick pt up for him to collect his belongings before going to [facility] [place] but doesn't want him to stay for too long.  CSw explained that for insurance purposes pt has to go straight to treatment from here for it to be covered.  Mother is working with [employer] on payment and insurance for  pt's treatment there.    Carmina Miller 09/11/2012, 12:36 PM

## 2012-09-12 LAB — TESTOSTERONE, % FREE: Testosterone-% Free: 2.5 % — ABNORMAL HIGH (ref 1.6–2.9)

## 2012-09-12 MED ORDER — DULOXETINE HCL 20 MG PO CPEP
20.0000 mg | ORAL_CAPSULE | Freq: Every day | ORAL | Status: DC
  Administered 2012-09-13: 20 mg via ORAL
  Filled 2012-09-12 (×3): qty 1

## 2012-09-12 NOTE — Progress Notes (Signed)
Adult Psychoeducational Group Note  Date:  09/12/2012 Time:  10:10 AM  Group Topic/Focus:  Alcholics Anonymous  Participation Level:  Active  Participation Quality:  Appropriate  Affect:  Appropriate  Cognitive:  Appropriate  Insight: Appropriate  Engagement in Group:  Engaged  Modes of Intervention:  Discussion  Additional Comments:  Pt attended AA group with counselor. Pt was engaged and appropriate.  [place] Shi K 09/12/2012, 10:10 AM

## 2012-09-12 NOTE — Progress Notes (Signed)
[facility] Group Notes:  (Nursing/MHT/Case Management/Adjunct)  Date:  09/12/2012  Time:  10:35 PM  Type of Therapy:  Group Therapy  Participation Level:  Active  Participation Quality:  Appropriate  Affect:  Appropriate  Cognitive:  Appropriate  Insight:  Appropriate  Engagement in Group:  Engaged  Modes of Intervention:  Activity, Socialization and Support  Summary of Progress/Problems: Pt. Was engaged in activity, and stated mediation and stress ball are two things he uses to cope with stress. Sondra Come 09/12/2012, 10:35 PM

## 2012-09-12 NOTE — Progress Notes (Signed)
The focus of this group is to educate the patient on the purpose and policies of crisis stabilization and provide a format to answer questions about their admission.  The group details unit policies and expectations of patients while admitted.  Patient attended and actively participated in the group discussion.  He was engaged and respectful of peers.

## 2012-09-12 NOTE — Progress Notes (Addendum)
Recreation Therapy Notes  Date: 08.28.2014 Time: 3:00pm Location: [address] Dayroom  Group Topic: Leisure Education  Goal Area(s) Addresses:  Patient will verbalize impact of positive leisure on sobriety. Patient will verbalize impact of leisure on self-esteem.  Behavioral Response: Engaged, Attentive, Appropriate,   Intervention: Air traffic controller  Activity: Leisure Time Management. Patients were given an worksheet outlining approximately 16 hours of the day, using various colors patients were asked to identify how they use their time. For example red = time at work, blue = time for self-care, green = time for leisure  Education: [employer], Discharge Planning  Education Outcome: Acknowledges understanding  Clinical Observations/Feedback: Patient actively participated in group session. Patient contributed to opening discussion, defining leisure for group. Patient completed worksheet as requested. Patient shared that his time was split between getting drunk and sleeping. Patient expressed a desire to change his habits, sharing that he is hoping to be placed in a 30-day program post d/c. Patient contributed to wrap up discussion, addressing the impact of leisure on self-esteem as well as the impact of leisure on sobriety.   Marykay Lex Posie Lillibridge, LRT/CTRS  Jearl Klinefelter 09/12/2012 4:37 PM

## 2012-09-12 NOTE — Progress Notes (Signed)
[facility] MD Progress Note  09/12/2012 4:14 PM Steve Bass  MRN:  [id number] Subjective:  Steve Bass continues to be aware and share traumatic experiences he has been trough. He is expressing a lot of emotion when talking about them. Apologizes. His testosterone level is low. Stats he has been tested before and is low but he has never been replaced. He is endorsing some clouding/slowing  in his thinking process. He continues to express a lot of regret as well as concerns as he could have hurt his parents Diagnosis:   DSM5: Schizophrenia Disorders:   Obsessive-Compulsive Disorders:   Trauma-Stressor Disorders:  Posttraumatic Stress Disorder (309.81) Substance/Addictive Disorders:  Alcohol Related Disorder - Severe (303.90), Cannabis Use Disorder - Moderate 9304.30) and Opioid Disorder - Moderate (304.00) Depressive Disorders:    Axis I: Anxiety Disorder NOS, Mood Disorder NOS and Panic Disorder  ADL's:  Intact  Sleep: Fair  Appetite:  Fair  Suicidal Ideation:  Plan:  denies Intent:  denies Means:  denies Homicidal Ideation:  Plan:  denies Intent:  denies Means:  denies AEB (as evidenced by):  Psychiatric Specialty Exam: Review of Systems  Constitutional: Negative.   HENT: Negative.   Eyes: Negative.   Respiratory: Negative.   Cardiovascular: Negative.   Gastrointestinal: Negative.   Genitourinary: Negative.   Musculoskeletal: Negative.   Skin: Negative.   Endo/Heme/Allergies: Negative.   Psychiatric/Behavioral: Positive for depression and substance abuse. The patient is nervous/anxious.     Blood pressure 128/85, pulse 86, temperature 98.3 F (36.8 C), temperature source Oral, resp. rate 18, height 6' (1.829 m), weight 140.615 kg (310 lb).Body mass index is 42.03 kg/(m^2).  General Appearance: Fairly Groomed  Patent attorney::  Fair  Speech:  Clear and Coherent and Slow  Volume:  Decreased  Mood:  Anxious and Depressed  Affect:  Tearful and anxious, worried, sad  Thought  Process:  Coherent and Goal Directed  Orientation:  Full (Time, Place, and Person)  Thought Content:  worries, concerns, fears  Suicidal Thoughts:  No  Homicidal Thoughts:  No  Memory:  Immediate;   Fair Recent;   Fair Remote;   Fair  Judgement:  Fair  Insight:  Present  Psychomotor Activity:  Restlessness  Concentration:  Fair  Recall:  Fair  Akathisia:  No  Handed:  Right  AIMS (if indicated):     Assets:  Desire for Improvement Social Support  Sleep:  Number of Hours: 5.75   Current Medications: Current Facility-Administered Medications  Medication Dose Route Frequency Provider Last Rate Last Dose  . acetaminophen (TYLENOL) tablet 650 mg  650 mg Oral Q6H PRN Kerry Hough, PA-C   650 mg at 09/11/12 1058  . alum & mag hydroxide-simeth (MAALOX/MYLANTA) 200-200-20 MG/5ML suspension 30 mL  30 mL Oral Q4H PRN Kerry Hough, PA-C      . buPROPion (WELLBUTRIN XL) 24 hr tablet 150 mg  150 mg Oral Daily Rachael Fee, MD   150 mg at 09/12/12 5784  . chlordiazePOXIDE (LIBRIUM) capsule 25 mg  25 mg Oral Q6H PRN Kerry Hough, PA-C   25 mg at 09/11/12 6962  . chlordiazePOXIDE (LIBRIUM) capsule 25 mg  25 mg Oral BH-qamhs Kerry Hough, PA-C       Followed by  . [START ON 09/14/2012] chlordiazePOXIDE (LIBRIUM) capsule 25 mg  25 mg Oral Daily Kerry Hough, PA-C      . DULoxetine (CYMBALTA) DR capsule 30 mg  30 mg Oral Daily Rachael Fee, MD   30  mg at 09/12/12 0809  . finasteride (PROPECIA) tablet 1 mg  1 mg Oral QHS Rachael Fee, MD      . gabapentin (NEURONTIN) capsule 300 mg  300 mg Oral TID Rachael Fee, MD   300 mg at 09/12/12 1143  . hydrOXYzine (ATARAX/VISTARIL) tablet 25 mg  25 mg Oral Q6H PRN Kerry Hough, PA-C   25 mg at 09/12/12 4098  . loperamide (IMODIUM) capsule 2-4 mg  2-4 mg Oral PRN Kerry Hough, PA-C      . magnesium hydroxide (MILK OF MAGNESIA) suspension 30 mL  30 mL Oral Daily PRN Kerry Hough, PA-C      . multivitamin with minerals tablet 1 tablet   1 tablet Oral Daily Kerry Hough, PA-C   1 tablet at 09/12/12 1191  . nicotine (NICODERM CQ - dosed in mg/24 hours) patch 21 mg  21 mg Transdermal Daily Kerry Hough, PA-C   21 mg at 09/12/12 4782  . ondansetron (ZOFRAN-ODT) disintegrating tablet 4 mg  4 mg Oral Q6H PRN Kerry Hough, PA-C      . pantoprazole (PROTONIX) EC tablet 40 mg  40 mg Oral Daily Kerry Hough, PA-C   40 mg at 09/12/12 0809  . thiamine (VITAMIN B-1) tablet 100 mg  100 mg Oral Daily Kerry Hough, PA-C   100 mg at 09/12/12 0809  . traZODone (DESYREL) tablet 200 mg  200 mg Oral QHS Rachael Fee, MD   200 mg at 09/11/12 2234    Lab Results:  Results for orders placed during the hospital encounter of 09/09/12 (from the past 48 hour(s))  TESTOSTERONE     Status: Abnormal   Collection Time    09/11/12  8:24 PM      Result Value Range   Testosterone 219 (*) 300 - 890 ng/dL   Comment: (NOTE)              Tanner Stage       Male              Male                  I              < 30 ng/dL        < 10 ng/dL                  II             < 150 ng/dL       < 30 ng/dL                  III            100-320 ng/dL     < 35 ng/dL                  IV             200-970 ng/dL     95-62 ng/dL                  V/Adult        300-890 ng/dL     13-08 ng/dL     Performed at [facility]  TESTOSTERONE, FREE     Status: None   Collection Time    09/11/12  8:24 PM      Result Value Range   Testosterone, Free 54.4  47.0 - 244.0  pg/mL   Comment: (NOTE)     The concentration of free testosterone is derived from a mathematical     expression based on constants for the binding of testosterone to sex     hormone-binding globulin and albumin.     Performed at [facility]  TESTOSTERONE, % FREE     Status: Abnormal   Collection Time    09/11/12  8:24 PM      Result Value Range   Testosterone-% Freee. 2.5 (*) 1.6 - 2.9 %   Comment: Performed at [facility]  SEX HORMONE BINDING GLOBULIN      Status: None   Collection Time    09/11/12  8:24 PM      Result Value Range   Sex Hormone Binding 20  13 - 71 nmol/L   Comment: Performed at [facility]    Physical Findings: AIMS: Facial and Oral Movements Muscles of Facial Expression: None, normal Lips and Perioral Area: None, normal Jaw: None, normal Tongue: None, normal,Extremity Movements Upper (arms, wrists, hands, fingers): None, normal Lower (legs, knees, ankles, toes): None, normal, Trunk Movements Neck, shoulders, hips: None, normal, Overall Severity Severity of abnormal movements (highest score from questions above): None, normal Incapacitation due to abnormal movements: None, normal Patient's awareness of abnormal movements (rate only patient's report): No Awareness, Dental Status Current problems with teeth and/or dentures?: No Does patient usually wear dentures?: No  CIWA:  CIWA-Ar Total: 2 COWS:  COWS Total Score: 1  Treatment Plan Summary: Daily contact with patient to assess and evaluate symptoms and progress in treatment Medication management  Plan: Supportive approach/coping skills/relapse prevention           Continue to reassess the co morbidities           Decrease the Cymbalta/increase the Wellbutrin  Medical Decision Making Problem Points:  Review of psycho-social stressors (1) Data Points:  Review of medication regiment & side effects (2)  I certify that inpatient services furnished can reasonably be expected to improve the patient's condition.   Mervil Wacker A 09/12/2012, 4:14 PM

## 2012-09-12 NOTE — [facility] Group Notes (Signed)
[facility] LCSW Group Therapy  09/12/2012  1:15 PM   Type of Therapy:  Group Therapy  Participation Level:  Active  Participation Quality:  Appropriate and Attentive  Affect:  Appropriate  Cognitive:  Alert and Appropriate  Insight:  Developing/Improving and Engaged  Engagement in Therapy:  Developing/Improving and Engaged  Modes of Intervention:  Clarification, Confrontation, Discussion, Education, Exploration, Limit-setting, Orientation, Problem-solving, Rapport Building, Dance movement psychotherapist, Socialization and Support  Summary of Progress/Problems: Finding Balance in Life. Today's group focused on defining balance in one's own words, identifying things that can knock one off balance, and exploring healthy ways to maintain balance in life. Group members were asked to provide an example of a time when they felt off balance, describe how they handled that situation,and process healthier ways to regain balance in the future. Group members were asked to share the most important tool for maintaining balance that they learned while at [facility] and how they plan to apply this method after discharge.  Pt shared that he didn't tell Dr. Dub Mikes how he was really doing, before coming in the hospital, because he felt ashamed and guilty for what he's done.  Pt states that he plans to restore balance in his life by referring to his higher power, exercising and going to further inpatient treatment. Pt actively participated and was engaged in group discussion.    Steve Bass, LCSWA 09/12/2012 2:17 PM

## 2012-09-12 NOTE — Progress Notes (Signed)
Patient ID: Steve Bass, male   DOB: 03-02-1980, 32 y.o.   MRN: [id number] He has been up and to groups interacting with peers an staff.His  plan is to go to a long term treatment center. He denies thoughts of SI, His depression and hopelessness is rated at 1.

## 2012-09-13 MED ORDER — BUPROPION HCL ER (XL) 300 MG PO TB24
300.0000 mg | ORAL_TABLET | Freq: Every day | ORAL | Status: DC
  Administered 2012-09-14 – 2012-09-17 (×4): 300 mg via ORAL
  Filled 2012-09-13 (×7): qty 1

## 2012-09-13 MED ORDER — TESTOSTERONE 50 MG/5GM (1%) TD GEL
5.0000 g | Freq: Every day | TRANSDERMAL | Status: DC
  Administered 2012-09-13 – 2012-09-17 (×5): 5 g via TRANSDERMAL
  Filled 2012-09-13: qty 70
  Filled 2012-09-13 (×4): qty 5

## 2012-09-13 NOTE — [facility] Group Notes (Signed)
[facility] LCSW Aftercare Discharge Planning Group Note   09/13/2012  8:45 AM  Participation Quality:  Alert and Appropriate   Mood/Affect:  Appropriate and Calm  Depression Rating:  0  Anxiety Rating:  1  Thoughts of Suicide:  Pt denies SI/HI  Will you contract for safety?   Yes  Current AVH:  Pt denies  Plan for Discharge/Comments:  Pt attended discharge planning group and actively participated in group.  CSW provided pt with today's workbook.  Pt states that he is doing well.  Pt is scheduled to go to [facility] [place] for further inpatient treatment.  Pt states that his family is packing his bags and making him go straight to [facility] from here.  No further needs voiced by pt at this time.    Transportation Means: Pt reports access to transportation - pt states that family will transport him to [employer]: No supports mentioned at this time  Steve Bass, LCSWA 09/13/2012 9:27 AM

## 2012-09-13 NOTE — [facility] Group Notes (Cosign Needed)
[facility] LCSW Group Therapy  09/13/2012  1:15 PM   Type of Therapy:  Group Therapy  Participation Level:  Active  Participation Quality:  Appropriate and Attentive  Affect:  Appropriate  Cognitive:  Alert and Appropriate  Insight:  Developing/Improving and Engaged  Engagement in Therapy:  Developing/Improving and Engaged  Modes of Intervention:  Clarification, Confrontation, Discussion, Education, Exploration, Limit-setting, Orientation, Problem-solving, Rapport Building, Dance movement psychotherapist, Socialization and Support  Summary of Progress/Problems: The topic for today was feelings about relapse.  Pt discussed what relapse prevention is to them and identified triggers that they are on the path to relapse.  Pt processed their feeling towards relapse and was able to relate to peers.  Pt discussed coping skills that can be used for relapse prevention.   Pt actively participated and was engaged in group discussion.  Pt shared that he has taken responsibility of his drug use and had his mom go through his room and get rid of all drugs and paraphernalia.  Pt states that this time he is ready to change and get well and feels motivated and committed to recovery this time.  Pt appears to have great insight to his recovery.    Steve Bass, LCSWA 09/13/2012 2:04 PM

## 2012-09-13 NOTE — Progress Notes (Signed)
Patient ID: Steve Bass, male   DOB: [birth year], 32 y.o.   MRN: [id number]  D: Patient pleasant and brightens on approach. No s/s of distress noted. Pt cooperative and interacting well with peers on unit.  A: Q 15 minute safety checks, encourage group attendance and peer interaction. Administer medications as ordered. R: Pt compliant with medications, pt participated in group session. Pt denies SI/HI.

## 2012-09-13 NOTE — Progress Notes (Signed)
Adult Psychoeducational Group Note  Date:  09/13/2012 Time:  1:11 PM  Group Topic/Focus:  Relapse Prevention Planning:   The focus of this group is to define relapse and discuss the need for planning to combat relapse.  Participation Level:  Active  Participation Quality:  Appropriate, Sharing and Supportive  Affect:  Appropriate  Cognitive:  Appropriate  Insight: Appropriate  Engagement in Group:  Engaged  Modes of Intervention:  Activity and Discussion  Additional Comments:  Pt where taught how to write SMART goals in relation to relapse prevention and recovery.   Roanna Banning, [place] N 09/13/2012, 1:11 PM

## 2012-09-13 NOTE — Progress Notes (Signed)
D   Pt is in bed resting with eyes closed  No distress noted A   Will continue Q 15 min checks and provide medications and verbal support when needed R   Pt safe at present

## 2012-09-13 NOTE — Tx Team (Signed)
Interdisciplinary Treatment Plan Update (Adult)  Date: 09/13/2012  Time Reviewed:  9:45 AM  Progress in Treatment: Attending groups: Yes Participating in groups:  Yes Taking medication as prescribed:  Yes Tolerating medication:  Yes Family/Significant othe contact made: Yes Patient understands diagnosis:  Yes Discussing patient identified problems/goals with staff:  Yes Medical problems stabilized or resolved:  Yes Denies suicidal/homicidal ideation: Yes Issues/concerns per patient self-inventory:  Yes Other:  New problem(s) identified: N/A  Discharge Plan or Barriers: Pt is scheduled to go to [facility] [place] for further treatment on Tuesday.    Reason for Continuation of Hospitalization: Anxiety Depression Medication Stabilization  Comments: N/A  Estimated length of stay: 4 days, d/c Tuesday  For review of initial/current patient goals, please see plan of care.  Attendees: Patient:     Family:     Physician:  Dr. Dub Mikes 09/13/2012 10:42 AM   Nursing:   Burnetta Sabin, RN 09/13/2012 10:42 AM   Clinical Social Worker:  Reyes Ivan, LCSWA 09/13/2012 10:42 AM   Other: Chinita Greenland, RN 09/13/2012 10:42 AM   Other:  Trula Slade, LCSWA 09/13/2012 10:42 AM   Other:  Serena Colonel, NP 09/13/2012 10:42 AM   Other:     Other:    Other:    Other:    Other:    Other:    Other:     Scribe for Treatment Team:   Carmina Miller, 09/13/2012 , 10:42 AM

## 2012-09-13 NOTE — Progress Notes (Signed)
Adult Psychoeducational Group Note  Date:  09/13/2012 Time:  10:13 PM  Group Topic/Focus:  AA group  Participation Level:  Active  Participation Quality:  Appropriate  Affect:  Appropriate  Cognitive:  Alert  Insight: Appropriate  Engagement in Group:  Engaged  Modes of Intervention:  Discussion  Additional Comments:    Flonnie Hailstone 09/13/2012, 10:13 PM

## 2012-09-13 NOTE — Progress Notes (Signed)
D:  Jayquon reports that he slept well and that his appetite is good.  He is rating depression and hopelessness at 1/10.  He denies any SI/HI/AVH at this time.  He is attending groups and is interacting appropriately with staff and other patients.   A: Safety checks q 15 minutes.  Emotional support provided.  Medication administered as ordered.   R:  Safety maintained on unit.

## 2012-09-13 NOTE — Progress Notes (Signed)
[facility] MD Progress Note  09/13/2012 3:08 PM Steve Bass  MRN:  [id number] Subjective:  Steve Bass has continued to work on the triggers for his on going use of drugs and alcohol. Has been able to share about traumatic events that have happened trough the years. His testosterone level was low. We started replacing. He feels more clear headed. He is encouraged by the way he is feeling.  Diagnosis:   DSM5: Schizophrenia Disorders:   Obsessive-Compulsive Disorders:   Trauma-Stressor Disorders:  Posttraumatic Stress Disorder (309.81) Substance/Addictive Disorders:  Alcohol Related Disorder - Severe (303.90), Cannabis Use Disorder - Moderate 9304.30) and Opioid Disorder - Moderate (304.00) Depressive Disorders:  Major Depressive Disorder - Moderate (296.22)  Axis I: ADHD, combined type and Anxiety Disorder NOS, Mood Disorder NOS  ADL's:  Intact  Sleep: Fair  Appetite:  Fair  Suicidal Ideation:  Plan:  denies Intent:  denies Means:  denies Homicidal Ideation:  Plan:  denies Intent:  denies Means:  denies AEB (as evidenced by):  Psychiatric Specialty Exam: Review of Systems  Constitutional: Negative.   HENT: Negative.   Eyes: Negative.   Respiratory: Negative.   Cardiovascular: Negative.   Gastrointestinal: Negative.   Genitourinary: Negative.   Musculoskeletal: Negative.   Skin: Negative.   Neurological: Negative.   Endo/Heme/Allergies: Negative.   Psychiatric/Behavioral: Positive for substance abuse. The patient is nervous/anxious.     Blood pressure 147/96, pulse 93, temperature 98.3 F (36.8 C), temperature source Oral, resp. rate 16, height 6' (1.829 m), weight 140.615 kg (310 lb).Body mass index is 42.03 kg/(m^2).  General Appearance: Fairly Groomed  Patent attorney::  Fair  Speech:  Clear and Coherent  Volume:  fluctuates  Mood:  Anxious and worried  Affect:  Appropriate  Thought Process:  Coherent and Goal Directed  Orientation:  Full (Time, Place, and Person)  Thought  Content:  symptoms, worries, concerns  Suicidal Thoughts:  No  Homicidal Thoughts:  No  Memory:  Immediate;   Fair Recent;   Fair Remote;   Fair  Judgement:  Fair  Insight:  Present  Psychomotor Activity:  Restlessness  Concentration:  Fair  Recall:  Fair  Akathisia:  No  Handed:  Right  AIMS (if indicated):     Assets:  Desire for Improvement Housing Social Support  Sleep:  Number of Hours: 6.25   Current Medications: Current Facility-Administered Medications  Medication Dose Route Frequency Provider Last Rate Last Dose  . acetaminophen (TYLENOL) tablet 650 mg  650 mg Oral Q6H PRN Kerry Hough, PA-C   650 mg at 09/11/12 1058  . alum & mag hydroxide-simeth (MAALOX/MYLANTA) 200-200-20 MG/5ML suspension 30 mL  30 mL Oral Q4H PRN Kerry Hough, PA-C      . [START ON 09/14/2012] buPROPion (WELLBUTRIN XL) 24 hr tablet 300 mg  300 mg Oral Daily Rachael Fee, MD      . Melene Muller ON 09/14/2012] chlordiazePOXIDE (LIBRIUM) capsule 25 mg  25 mg Oral Daily Kerry Hough, PA-C      . finasteride (PROPECIA) tablet 1 mg  1 mg Oral QHS Rachael Fee, MD   1 mg at 09/12/12 2231  . gabapentin (NEURONTIN) capsule 300 mg  300 mg Oral TID Rachael Fee, MD   300 mg at 09/13/12 1114  . magnesium hydroxide (MILK OF MAGNESIA) suspension 30 mL  30 mL Oral Daily PRN Kerry Hough, PA-C      . multivitamin with minerals tablet 1 tablet  1 tablet Oral Daily Karleen Hampshire E  Simon, PA-C   1 tablet at 09/13/12 0756  . nicotine (NICODERM CQ - dosed in mg/24 hours) patch 21 mg  21 mg Transdermal Daily Kerry Hough, PA-C   21 mg at 09/13/12 0755  . pantoprazole (PROTONIX) EC tablet 40 mg  40 mg Oral Daily Kerry Hough, PA-C   40 mg at 09/13/12 0800  . testosterone (ANDROGEL) gel 5 g  5 g Transdermal Daily Rachael Fee, MD   5 g at 09/13/12 1112  . thiamine (VITAMIN B-1) tablet 100 mg  100 mg Oral Daily Kerry Hough, PA-C   100 mg at 09/13/12 0756  . traZODone (DESYREL) tablet 200 mg  200 mg Oral QHS Rachael Fee, MD   200 mg at 09/12/12 2230    Lab Results:  Results for orders placed during the hospital encounter of 09/09/12 (from the past 48 hour(s))  TESTOSTERONE     Status: Abnormal   Collection Time    09/11/12  8:24 PM      Result Value Range   Testosterone 219 (*) 300 - 890 ng/dL   Comment: (NOTE)              Tanner Stage       Male              Male                  I              < 30 ng/dL        < 10 ng/dL                  II             < 150 ng/dL       < 30 ng/dL                  III            100-320 ng/dL     < 35 ng/dL                  IV             200-970 ng/dL     40-98 ng/dL                  V/Adult        300-890 ng/dL     11-91 ng/dL     Performed at [facility]  TESTOSTERONE, FREE     Status: None   Collection Time    09/11/12  8:24 PM      Result Value Range   Testosterone, Free 54.4  47.0 - 244.0 pg/mL   Comment: (NOTE)     The concentration of free testosterone is derived from a mathematical     expression based on constants for the binding of testosterone to sex     hormone-binding globulin and albumin.     Performed at [facility]  TESTOSTERONE, % FREE     Status: Abnormal   Collection Time    09/11/12  8:24 PM      Result Value Range   Testosterone-% Freee. 2.5 (*) 1.6 - 2.9 %   Comment: Performed at [facility]  SEX HORMONE BINDING GLOBULIN     Status: None   Collection Time    09/11/12  8:24 PM      Result Value Range  Sex Hormone Binding 20  13 - 71 nmol/L   Comment: Performed at [facility]    Physical Findings: AIMS: Facial and Oral Movements Muscles of Facial Expression: None, normal Lips and Perioral Area: None, normal Jaw: None, normal Tongue: None, normal,Extremity Movements Upper (arms, wrists, hands, fingers): None, normal Lower (legs, knees, ankles, toes): None, normal, Trunk Movements Neck, shoulders, hips: None, normal, Overall Severity Severity of abnormal movements (highest score  from questions above): None, normal Incapacitation due to abnormal movements: None, normal Patient's awareness of abnormal movements (rate only patient's report): No Awareness, Dental Status Current problems with teeth and/or dentures?: No Does patient usually wear dentures?: No  CIWA:  CIWA-Ar Total: 0 COWS:  COWS Total Score: 1  Treatment Plan Summary: Daily contact with patient to assess and evaluate symptoms and progress in treatment Medication management  Plan: Supportive approach/coping skills/relapse prevention           D/C Cymbalta           Increase Wellbutrin to XL 300 mg in AM           Continue Neurontin 300 mg TID (down from 1800 TID)             Medical Decision Making Problem Points:  Review of psycho-social stressors (1) Data Points:  Review of medication regiment & side effects (2) Review of new medications or change in dosage (2)  I certify that inpatient services furnished can reasonably be expected to improve the patient's condition.   Mishell Donalson A 09/13/2012, 3:08 PM

## 2012-09-14 DIAGNOSIS — F1010 Alcohol abuse, uncomplicated: Secondary | ICD-10-CM

## 2012-09-14 DIAGNOSIS — F332 Major depressive disorder, recurrent severe without psychotic features: Secondary | ICD-10-CM

## 2012-09-14 DIAGNOSIS — F1910 Other psychoactive substance abuse, uncomplicated: Secondary | ICD-10-CM

## 2012-09-14 MED ORDER — NALTREXONE HCL 50 MG PO TABS
25.0000 mg | ORAL_TABLET | Freq: Once | ORAL | Status: DC
  Filled 2012-09-14 (×2): qty 1

## 2012-09-14 MED ORDER — NALTREXONE HCL 50 MG PO TABS
50.0000 mg | ORAL_TABLET | Freq: Every day | ORAL | Status: DC
  Filled 2012-09-14 (×3): qty 1

## 2012-09-14 MED ORDER — HYDROXYZINE HCL 50 MG PO TABS
50.0000 mg | ORAL_TABLET | Freq: Every day | ORAL | Status: DC
  Administered 2012-09-14 – 2012-09-16 (×3): 50 mg via ORAL
  Filled 2012-09-14 (×5): qty 1

## 2012-09-14 NOTE — Progress Notes (Addendum)
[facility] MD Progress Note  09/14/2012 5:40 PM Steve Bass  MRN:  [id number] Subjective:  Steve Bass reports that he is having cravings for alcohol and pain pills. He denies any withdrawal symptoms. He reports that he has no depression, and rates his depression as a 1 on a scale of 1-10 where 10 is the worst. He endorses a significant amount of anxiety, and rates it as a 7-10 on a scale of 1-10. His appetite is good, but he is having a significant problem falling to sleep. He reports 2-3 hours of sleep latency. He denies any suicidal or homicidal ideation. He denies any auditory or visual hallucinations. He plans to go directly to the [facility] [place] after discharge from this facility. Diagnosis:   DSM5: Schizophrenia Disorders:   Obsessive-Compulsive Disorders:   Trauma-Stressor Disorders:  Posttraumatic Stress Disorder (309.81) Substance/Addictive Disorders:  Alcohol Related Disorder - Severe (303.90), Cannabis Use Disorder - Severe (304.30) and Opioid Disorder - Severe (304.00) Depressive Disorders:  Major Depressive Disorder - Severe (296.23)  Axis I: Alcohol Abuse, Major Depression, Recurrent severe, Post Traumatic Stress Disorder and Substance Abuse Axis II: Deferred Axis III:  Past Medical History  Diagnosis Date  . ADD (attention deficit disorder with hyperactivity)   . Anxiety and depression   . Shingles   . GERD (gastroesophageal reflux disease)     LA classification Grade C EE  . PTSD (post-traumatic stress disorder)   . Obesity     ADL's:  Intact  Sleep: Fair  Appetite:  Good  Suicidal Ideation:  Patient denies any thought, plan, or intent Homicidal Ideation:  Patient denies any thought, plan, or intent AEB (as evidenced by):  Psychiatric Specialty Exam: Review of Systems  Constitutional: Negative.   HENT: Negative.   Eyes: Negative.   Respiratory: Negative.   Cardiovascular: Negative.   Gastrointestinal: Negative.   Genitourinary: Negative.    Musculoskeletal: Negative.   Skin: Negative.   Neurological: Negative.   Endo/Heme/Allergies: Negative.   Psychiatric/Behavioral: Negative for depression, suicidal ideas and hallucinations. The patient is nervous/anxious and has insomnia.     Blood pressure 114/80, pulse 90, temperature 98.1 F (36.7 C), temperature source Oral, resp. rate 20, height 6' (1.829 m), weight 140.615 kg (310 lb).Body mass index is 42.03 kg/(m^2).  General Appearance: Casual  Eye Contact::  Good  Speech:  Clear and Coherent  Volume:  Normal  Mood:  Anxious  Affect:  Non-Congruent  Thought Process:  Linear  Orientation:  Full (Time, Place, and Person)  Thought Content:  WDL  Suicidal Thoughts:  No  Homicidal Thoughts:  No  Memory:  Immediate;   Good Recent;   Good Remote;   Good  Judgement:  Fair  Insight:  Fair  Psychomotor Activity:  Normal  Concentration:  Good  Recall:  Good  Akathisia:  No  Handed:  Right  AIMS (if indicated):     Assets:  Communication Skills Desire for Improvement Physical Health Resilience Social Support  Sleep:  Number of Hours: 4.5   Current Medications: Current Facility-Administered Medications  Medication Dose Route Frequency Provider Last Rate Last Dose  . acetaminophen (TYLENOL) tablet 650 mg  650 mg Oral Q6H PRN Kerry Hough, PA-C   650 mg at 09/11/12 1058  . alum & mag hydroxide-simeth (MAALOX/MYLANTA) 200-200-20 MG/5ML suspension 30 mL  30 mL Oral Q4H PRN Kerry Hough, PA-C      . buPROPion (WELLBUTRIN XL) 24 hr tablet 300 mg  300 mg Oral Daily Rachael Fee,  MD   300 mg at 09/14/12 0750  . finasteride (PROPECIA) tablet 1 mg  1 mg Oral QHS Rachael Fee, MD   1 mg at 09/13/12 2229  . gabapentin (NEURONTIN) capsule 300 mg  300 mg Oral TID Rachael Fee, MD   300 mg at 09/14/12 1629  . hydrOXYzine (ATARAX/VISTARIL) tablet 50 mg  50 mg Oral QHS Jorje Guild, PA-C      . magnesium hydroxide (MILK OF MAGNESIA) suspension 30 mL  30 mL Oral Daily PRN Kerry Hough, PA-C      . multivitamin with minerals tablet 1 tablet  1 tablet Oral Daily Kerry Hough, PA-C   1 tablet at 09/14/12 0750  . naltrexone (DEPADE) tablet 25 mg  25 mg Oral Once Jorje Guild, PA-C      . Melene Muller ON 09/15/2012] naltrexone (DEPADE) tablet 50 mg  50 mg Oral Daily Jorje Guild, PA-C      . nicotine (NICODERM CQ - dosed in mg/24 hours) patch 21 mg  21 mg Transdermal Daily Kerry Hough, PA-C   21 mg at 09/14/12 0750  . pantoprazole (PROTONIX) EC tablet 40 mg  40 mg Oral Daily Kerry Hough, PA-C   40 mg at 09/14/12 0750  . testosterone (ANDROGEL) gel 5 g  5 g Transdermal Daily Rachael Fee, MD   5 g at 09/14/12 0801  . thiamine (VITAMIN B-1) tablet 100 mg  100 mg Oral Daily Kerry Hough, PA-C   100 mg at 09/14/12 0750  . traZODone (DESYREL) tablet 200 mg  200 mg Oral QHS Rachael Fee, MD   200 mg at 09/13/12 2229    Lab Results: No results found for this or any previous visit (from the past 48 hour(s)).  Physical Findings: AIMS: Facial and Oral Movements Muscles of Facial Expression: None, normal Lips and Perioral Area: None, normal Jaw: None, normal Tongue: None, normal,Extremity Movements Upper (arms, wrists, hands, fingers): None, normal Lower (legs, knees, ankles, toes): None, normal, Trunk Movements Neck, shoulders, hips: None, normal, Overall Severity Severity of abnormal movements (highest score from questions above): None, normal Incapacitation due to abnormal movements: None, normal Patient's awareness of abnormal movements (rate only patient's report): No Awareness, Dental Status Current problems with teeth and/or dentures?: No Does patient usually wear dentures?: No  CIWA:  CIWA-Ar Total: 0 COWS:  COWS Total Score: 1  Treatment Plan Summary: Daily contact with patient to assess and evaluate symptoms and progress in treatment Medication management  Plan: We will start him on naltrexone 50 mg daily for cravings, and try Vistaril 50 mg at bedtime to  assist in onset to sleep.   Medical Decision Making Problem Points:  Established problem, stable/improving (1) and Review of psycho-social stressors (1) Data Points:  Review or order clinical lab tests (1) Review of medication regiment & side effects (2) Review of new medications or change in dosage (2)  I certify that inpatient services furnished can reasonably be expected to improve the patient's condition.   Airlie Blumenberg 09/14/2012, 5:40 PM

## 2012-09-14 NOTE — Progress Notes (Signed)
Pt was seen by Jorje Guild PA today, and was started on Naltrexone for cravings and withdrawal. I went to administer the medication at 6:30pm, and the patient reported that he had a reaction to Suboxone in the past and that he did not want to take the Naltrexone. Pt at that time requested to get Subutex. I informed patient that I would notify Jorje Guild regarding situation. Jorje Guild was notified and made aware of situation, he did not want to start patient on Subutex at this time.
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
## 2012-09-14 NOTE — Progress Notes (Signed)
Patient ID: Steve Bass, male   DOB: October 31, 1980, 32 y.o.   MRN: [id number]  D: Pt has been appropriate on the unit, he has reported that this has been the best day for him since he has been at [facility]. Pt reported that he was not having any withdrawal symptoms, and that he was feeling ok. Pt reported that he was not depressed, and that he was not feeling hopeless. Pt reported being negative SI/HI, no AH/VH noted. A: 15 min checks continued for patient safety. R: Pts safety maintained.

## 2012-09-14 NOTE — Progress Notes (Signed)
Psychoeducational Group Note  Date:  09/14/2012 Time:  0945 am  Group Topic/Focus:  Identifying Needs:   The focus of this group is to help patients identify their personal needs that have been historically problematic and identify healthy behaviors to address their needs.  Participation Level:  Active  Participation Quality:  Appropriate, Attentive, Sharing and Supportive  Affect:  Appropriate  Cognitive:  Alert and Appropriate  Insight:  Engaged  Engagement in Group:  Engaged  Additional Comments:    Andrena Mews 09/14/2012,3:59 PM

## 2012-09-14 NOTE — Progress Notes (Signed)
Adult Psychoeducational Group Note  Date:  09/14/2012 Time:  4:22 PM  Group Topic/Focus:  Healthy Communication:   The focus of this group is to discuss communication, barriers to communication, as well as healthy ways to communicate with others.  Participation Level:  Active  Participation Quality:  Appropriate, Sharing and Supportive  Affect:  Appropriate  Cognitive:  Appropriate  Insight: Appropriate  Engagement in Group:  Engaged  Modes of Intervention:  Activity and Discussion  Additional Comments:  Pt actively participated in communication activity.    Tora Perches N 09/14/2012, 4:22 PM

## 2012-09-14 NOTE — [facility] Group Notes (Signed)
[facility] Group Notes:  (Nursing/MHT/Case Management/Adjunct)  Date:  09/14/2012  Time:  1:56 PM  Type of Therapy:  Psychoeducational Skills  Participation Level:  Active  Participation Quality:  Appropriate  Affect:  Appropriate  Cognitive:  Appropriate  Insight:  Appropriate  Engagement in Group:  Engaged  Modes of Intervention:  Problem-solving  Summary of Progress/Problems: Pt attended self inventory group, and engaged in treatment. Pt reported that today has been the best since he was admitted and that he hopes his day continues to go well.   Jacquelyne Balint Shanta 09/14/2012, 1:56 PM

## 2012-09-15 DIAGNOSIS — F331 Major depressive disorder, recurrent, moderate: Secondary | ICD-10-CM

## 2012-09-15 MED ORDER — HYDROXYZINE HCL 25 MG PO TABS
25.0000 mg | ORAL_TABLET | Freq: Four times a day (QID) | ORAL | Status: DC | PRN
  Administered 2012-09-16 – 2012-09-17 (×2): 25 mg via ORAL
  Filled 2012-09-15: qty 1

## 2012-09-15 NOTE — Progress Notes (Signed)
[facility] Group Notes:  (Nursing/MHT/Case Management/Adjunct)  Date:  09/14/2012  Time: 2100 Type of Therapy:  wrap up group  Participation Level:  Active  Participation Quality:  Appropriate, Attentive, Sharing and Supportive  Affect:  Appropriate  Cognitive:  Appropriate  Insight:  Good and Improving  Engagement in Group:  Engaged  Modes of Intervention:  Clarification, Education and Support  Summary of Progress/Problems: Pt reports " I was walking along today and just fell right into a hole".  Pt was speaking about a period of time with extreme craving. Pt eventually struggled through and got past craving. Pt reports a feeling of surrendering and knowing he needs to be here. Pt also shares that he wants to refuse some of his detox meds because "I need to feel this". Pt reports finding strength today. Shelah Lewandowsky 09/15/2012, 4:45 AM

## 2012-09-15 NOTE — [facility] Group Notes (Signed)
[facility] Group Notes:  (Nursing/MHT/Case Management/Adjunct)  Date:  09/15/2012  Time:  10:32 AM  Type of Therapy: Psychoeducational Skills  Participation Level: Active  Participation Quality: Appropriate  Affect: Appropriate  Cognitive: Appropriate  Insight: Appropriate  Engagement in Group: Engaged  Modes of Intervention: Problem-solving  Summary of Progress/Problems: Pt attended self inventory group, and engaged in treatment.  Steve Bass Shanta 09/15/2012, 10:32 AM 

## 2012-09-15 NOTE — Progress Notes (Signed)
[facility] MD Progress Note  09/15/2012 1:33 PM Steve Bass  MRN:  [id number] Subjective:  Steve Bass reports that he continues to have cravings for opiates, but he has refused to start on naltrexone that we discussed yesterday. He is worried because he reports that while on Suboxone in the past he had an adverse reaction to the naloxone.  He reports that he had blurred vision and flushing. He asked about Subutex, but does not want to be on anything but will be addictive. He reports that his depression is nonexistent, but endorses anxiety at a level of 7/10 where 10 is the worst. He reports that his appetite and sleep are good. He denies any suicidal or homicidal ideation. He denies any auditory or visual hallucinations.  Diagnosis:   DSM5: Schizophrenia Disorders:   Obsessive-Compulsive Disorders:   Trauma-Stressor Disorders:  Posttraumatic Stress Disorder (309.81) Substance/Addictive Disorders:  Alcohol Related Disorder - Severe (303.90), Cannabis Use Disorder - Severe (304.30) and Opioid Disorder - Severe (304.00) Depressive Disorders:  Major Depressive Disorder - Moderate (296.22)  Axis I: Alcohol Abuse, Major Depression, Recurrent moderate, Post Traumatic Stress Disorder and Substance Abuse Axis II: Deferred Axis III:  Past Medical History  Diagnosis Date  . ADD (attention deficit disorder with hyperactivity)   . Anxiety and depression   . Shingles   . GERD (gastroesophageal reflux disease)     LA classification Grade C EE  . PTSD (post-traumatic stress disorder)   . Obesity     ADL's:  Intact  Sleep: Good  Appetite:  Good  Suicidal Ideation:  Patient denies any thought, plan, or intent Homicidal Ideation:  Patient denies any thought, plan, or intent AEB (as evidenced by):  Psychiatric Specialty Exam: Review of Systems  Constitutional: Negative.   HENT: Negative.   Eyes: Negative.   Respiratory: Negative.   Cardiovascular: Negative.   Gastrointestinal: Negative.    Genitourinary: Negative.   Musculoskeletal: Negative.   Skin: Negative.   Neurological: Negative.   Endo/Heme/Allergies: Negative.   Psychiatric/Behavioral: Negative for depression, suicidal ideas and hallucinations. The patient is nervous/anxious. The patient does not have insomnia.     Blood pressure 129/77, pulse 85, temperature 98.1 F (36.7 C), temperature source Oral, resp. rate 18, height 6' (1.829 m), weight 140.615 kg (310 lb).Body mass index is 42.03 kg/(m^2).  General Appearance: Casual  Eye Contact::  Good  Speech:  Clear and Coherent  Volume:  Normal  Mood:  Anxious  Affect:  Non-Congruent  Thought Process:  Goal Directed and Linear  Orientation:  Full (Time, Place, and Person)  Thought Content:  WDL  Suicidal Thoughts:  No  Homicidal Thoughts:  No  Memory:  Immediate;   Fair Recent;   Fair Remote;   Fair  Judgement:  Fair  Insight:  Lacking  Psychomotor Activity:  Normal  Concentration:  Good  Recall:  Good  Akathisia:  No  Handed:  Right  AIMS (if indicated):     Assets:  Communication Skills Desire for Improvement Resilience  Sleep:  Number of Hours: 5.5   Current Medications: Current Facility-Administered Medications  Medication Dose Route Frequency Provider Last Rate Last Dose  . acetaminophen (TYLENOL) tablet 650 mg  650 mg Oral Q6H PRN Kerry Hough, PA-C   650 mg at 09/11/12 1058  . alum & mag hydroxide-simeth (MAALOX/MYLANTA) 200-200-20 MG/5ML suspension 30 mL  30 mL Oral Q4H PRN Kerry Hough, PA-C      . buPROPion (WELLBUTRIN XL) 24 hr tablet 300 mg  300 mg  Oral Daily Rachael Fee, MD   300 mg at 09/15/12 0744  . finasteride (PROPECIA) tablet 1 mg  1 mg Oral QHS Rachael Fee, MD   1 mg at 09/14/12 2200  . gabapentin (NEURONTIN) capsule 300 mg  300 mg Oral TID Rachael Fee, MD   300 mg at 09/15/12 1128  . hydrOXYzine (ATARAX/VISTARIL) tablet 50 mg  50 mg Oral QHS Jorje Guild, PA-C   50 mg at 09/14/12 2229  . magnesium hydroxide (MILK OF  MAGNESIA) suspension 30 mL  30 mL Oral Daily PRN Kerry Hough, PA-C      . multivitamin with minerals tablet 1 tablet  1 tablet Oral Daily Kerry Hough, PA-C   1 tablet at 09/15/12 0744  . naltrexone (DEPADE) tablet 25 mg  25 mg Oral Once Jorje Guild, PA-C      . naltrexone (DEPADE) tablet 50 mg  50 mg Oral Daily Jorje Guild, PA-C      . nicotine (NICODERM CQ - dosed in mg/24 hours) patch 21 mg  21 mg Transdermal Daily Kerry Hough, PA-C   21 mg at 09/15/12 0744  . pantoprazole (PROTONIX) EC tablet 40 mg  40 mg Oral Daily Kerry Hough, PA-C   40 mg at 09/15/12 0746  . testosterone (ANDROGEL) gel 5 g  5 g Transdermal Daily Rachael Fee, MD   5 g at 09/15/12 0744  . thiamine (VITAMIN B-1) tablet 100 mg  100 mg Oral Daily Kerry Hough, PA-C   100 mg at 09/15/12 0744  . traZODone (DESYREL) tablet 200 mg  200 mg Oral QHS Rachael Fee, MD   200 mg at 09/14/12 2230    Lab Results: No results found for this or any previous visit (from the past 48 hour(s)).  Physical Findings: AIMS: Facial and Oral Movements Muscles of Facial Expression: None, normal Lips and Perioral Area: None, normal Jaw: None, normal Tongue: None, normal,Extremity Movements Upper (arms, wrists, hands, fingers): None, normal Lower (legs, knees, ankles, toes): None, normal, Trunk Movements Neck, shoulders, hips: None, normal, Overall Severity Severity of abnormal movements (highest score from questions above): None, normal Incapacitation due to abnormal movements: None, normal Patient's awareness of abnormal movements (rate only patient's report): No Awareness, Dental Status Current problems with teeth and/or dentures?: No Does patient usually wear dentures?: No  CIWA:  CIWA-Ar Total: 0 COWS:  COWS Total Score: 1  Treatment Plan Summary: Daily contact with patient to assess and evaluate symptoms and progress in treatment Medication management  Plan: We will discontinue the naltrexone as he does not want to  take it. Otherwise we will continue his current level of care.  Medical Decision Making Problem Points:  Established problem, stable/improving (1) and Review of psycho-social stressors (1) Data Points:  Review or order clinical lab tests (1) Review of medication regiment & side effects (2)  I certify that inpatient services furnished can reasonably be expected to improve the patient's condition.   Jaun Galluzzo 09/15/2012, 1:33 PM

## 2012-09-15 NOTE — Progress Notes (Signed)
D.  Pt pleasant on approach, states that he is not going to take medication for his cravings, just going to "tough it out"  Pt states he thinks that is the best way for him to go rather than to be started on another drug.  Denies SI/HI/hallucinations at this time.  Interacting appropriately within milieu.  Positive for evening wrap up group.  A.  Support and encouragement offered  R.  Pt remains safe on unit, will continue to monitor.

## 2012-09-15 NOTE — [facility] Group Notes (Signed)
[facility] Group Notes:  (Clinical Social Work)  09/15/2012  10:00-11:00AM  Summary of Progress/Problems:   The main focus of today's process group was to   identify the patient's current support system and decide on other supports that can be put in place.  The picture on workbook was used to discuss why additional supports are needed, and a hand-out was distributed with four definitions/levels of support, then used to talk about how patients have given and received all different kinds of support.  An emphasis was placed on using counselor, doctor, therapy groups, 12-step groups, and problem-specific support groups to expand supports.  The patient identified one additional support as being a 30-day rehab program following this hospitalization.  He stated he will be "just putting one foot in front of the other."  Today he had an open phone conversation with his sister and feels that she cares about him, is relieved and excited about this.  Type of Therapy:  Process Group with Motivational Interviewing  Participation Level:  Active  Participation Quality:  Attentive, Sharing and Supportive  Affect:  Tearful  Cognitive:  Appropriate and Oriented  Insight:  Engaged  Engagement in Therapy:  Engaged  Modes of Intervention:   Education, Support and Processing, Activity  [employer], LCSW 09/15/2012, 1:09 PM

## 2012-09-15 NOTE — [facility] Group Notes (Signed)
[facility] Group Notes:  (Nursing/MHT/Case Management/Adjunct)  Date:  09/15/2012  Time:  10:32 AM  Type of Therapy: Psychoeducational Skills  Participation Level: Active  Participation Quality: Appropriate  Affect: Appropriate  Cognitive: Appropriate  Insight: Appropriate  Engagement in Group: Engaged  Modes of Intervention: Problem-solving  Summary of Progress/Problems: Pt attended self inventory group, and engaged in treatment.  Jacquelyne Balint Shanta 09/15/2012, 10:32 AM

## 2012-09-15 NOTE — Progress Notes (Signed)
Patient ID: Steve Bass, male   DOB: 1980-10-24, 32 y.o.   MRN: [id number]  D: Pt has been very flat on the unit today. Pt reported that he was having cravings, but was going to try to push through it without medication. One hour later he started to request something for cravings, then reported that he was fine and did not need anything. Pt has attended groups and has engaged in treatment. Pt reported being negative SI/HI, no AH/VH noted. A: 15 min checks continued for patient safety. R: Pts safety maintained.

## 2012-09-15 NOTE — Progress Notes (Signed)
D.  Pt pleasant on approach, denies complaints other than some anxiety due to cravings today.  Positive for evening AA group.  Interacting appropriately within milieu.  Denies SI/HI/hallucinations at this time.  A.  Support and encouragement offered  R.  Pt remains safe on unit, will continue to monitor.

## 2012-09-16 MED ORDER — NALTREXONE HCL 50 MG PO TABS
25.0000 mg | ORAL_TABLET | Freq: Once | ORAL | Status: AC
  Administered 2012-09-16: 11:00:00 via ORAL
  Filled 2012-09-16: qty 1

## 2012-09-16 NOTE — Progress Notes (Signed)
Adult Psychoeducational Group Note  Date:  09/16/2012 Time:  6:45 PM  Group Topic/Focus:  Dimensions of Wellness:   The focus of this group is to introduce the topic of wellness and discuss the role each dimension of wellness plays in total health.  Participation Level:  Active  Participation Quality:  Attentive and Supportive  Affect:  Appropriate  Cognitive:  Alert and Appropriate  Insight: Appropriate and Good  Engagement in Group:  Engaged and Improving  Modes of Intervention:  Discussion and Support  Additional Comments:    Reynolds Bowl 09/16/2012, 6:45 PM

## 2012-09-16 NOTE — Progress Notes (Signed)
Patient ID: Steve Bass, male   DOB: 06-15-1980, 32 y.o.   MRN: [id number]  D: Pt has been anxious on the unit today, he reports being ready for discharge so that he could go to his treatment facility but is worried. Pt reports that he is still having cravings, and was started back on Naltrexone. I spoke to the patient regarding the Naltrexone, because he had refused medication all weekend due to reporting being allergic to it. Pt reported that he talked to Dr. Dub Mikes, and feels like he can take now and that he would be fine. Also the patient signed a 72 hour request for discharge on 8/25, but it is invalid due to the patient being involuntary admitted. Dr. Dub Mikes and Minerva Areola [facility] made aware of situation. Pt reports being negative SI/HI, no AH/VH noted. A: 15 min checks continued for patient safety. R: Pts safety maintained.

## 2012-09-16 NOTE — Progress Notes (Signed)
[facility] MD Progress Note  09/16/2012 3:13 PM Steve Bass  MRN:  [id number] Subjective:   States that he feels ready to do the work he needs to do to pursue long term abstinence. His mood is still somewhat unstable. He states that 2 days ago everything came down on him he had the realization of how bad a shape he allowed himself to get, became agitated experienced cravings. He was able to handle them because he was here otherwise he would have gone to use. Marland Kitchen He states that he feels he is dealing with a lot of grief for all the losses he has had Diagnosis:   DSM5: Schizophrenia Disorders:   Obsessive-Compulsive Disorders:   Trauma-Stressor Disorders:  Posttraumatic Stress Disorder (309.81) Substance/Addictive Disorders:  Alcohol Related Disorder - Severe (303.90) and Cannabis Use Disorder - Moderate 9304.30) Depressive Disorders:  Major Depressive Disorder - Moderate (296.22)  Axis I: Anxiety Disorder NOS, Mood Disorder NOS and Post Traumatic Stress Disorder  ADL's:  Intact  Sleep: Fair  Appetite:  Fair  Suicidal Ideation:  Plan:  denies Intent:  denies Means:  denies Homicidal Ideation:  Plan:  denies Intent:  denies Means:  denies AEB (as evidenced by):  Psychiatric Specialty Exam: Review of Systems  Constitutional: Negative.   HENT: Negative.   Eyes: Negative.   Respiratory: Negative.   Cardiovascular: Negative.   Gastrointestinal: Negative.   Genitourinary: Negative.   Musculoskeletal: Negative.   Skin: Negative.   Neurological: Negative.   Endo/Heme/Allergies: Negative.   Psychiatric/Behavioral: Positive for depression and substance abuse. The patient is nervous/anxious.     Blood pressure 118/80, pulse 83, temperature 98 F (36.7 C), temperature source Oral, resp. rate 20, height 6' (1.829 m), weight 140.615 kg (310 lb).Body mass index is 42.03 kg/(m^2).  General Appearance: Fairly Groomed  Patent attorney::  Fair  Speech:  Clear and Coherent  Volume:  Normal   Mood:  Anxious and worried  Affect:  anxious, worried  Thought Process:  Coherent and Goal Directed  Orientation:  Full (Time, Place, and Person)  Thought Content:  worries, concerns  Suicidal Thoughts:  No  Homicidal Thoughts:  No  Memory:  Immediate;   Fair Recent;   Fair Remote;   Fair  Judgement:  Fair  Insight:  Present  Psychomotor Activity:  Restlessness  Concentration:  Fair  Recall:  Fair  Akathisia:  No  Handed:  Right  AIMS (if indicated):     Assets:  Desire for Improvement  Sleep:  Number of Hours: 5.25   Current Medications: Current Facility-Administered Medications  Medication Dose Route Frequency Provider Last Rate Last Dose  . acetaminophen (TYLENOL) tablet 650 mg  650 mg Oral Q6H PRN Kerry Hough, PA-C   650 mg at 09/11/12 1058  . alum & mag hydroxide-simeth (MAALOX/MYLANTA) 200-200-20 MG/5ML suspension 30 mL  30 mL Oral Q4H PRN Kerry Hough, PA-C      . buPROPion (WELLBUTRIN XL) 24 hr tablet 300 mg  300 mg Oral Daily Rachael Fee, MD   300 mg at 09/16/12 0804  . finasteride (PROPECIA) tablet 1 mg  1 mg Oral QHS Rachael Fee, MD   1 mg at 09/15/12 2200  . gabapentin (NEURONTIN) capsule 300 mg  300 mg Oral TID Rachael Fee, MD   300 mg at 09/16/12 1142  . hydrOXYzine (ATARAX/VISTARIL) tablet 25 mg  25 mg Oral Q6H PRN Court Joy, PA-C      . hydrOXYzine (ATARAX/VISTARIL) tablet 50 mg  50 mg Oral QHS Jorje Guild, PA-C   50 mg at 09/15/12 2230  . magnesium hydroxide (MILK OF MAGNESIA) suspension 30 mL  30 mL Oral Daily PRN Kerry Hough, PA-C      . multivitamin with minerals tablet 1 tablet  1 tablet Oral Daily Kerry Hough, PA-C   1 tablet at 09/16/12 0805  . naltrexone (DEPADE) tablet 25 mg  25 mg Oral Once Jorje Guild, PA-C      . nicotine (NICODERM CQ - dosed in mg/24 hours) patch 21 mg  21 mg Transdermal Daily Kerry Hough, PA-C   21 mg at 09/16/12 0805  . pantoprazole (PROTONIX) EC tablet 40 mg  40 mg Oral Daily Kerry Hough, PA-C   40 mg  at 09/16/12 0804  . testosterone (ANDROGEL) gel 5 g  5 g Transdermal Daily Rachael Fee, MD   5 g at 09/16/12 0805  . thiamine (VITAMIN B-1) tablet 100 mg  100 mg Oral Daily Kerry Hough, PA-C   100 mg at 09/16/12 0805  . traZODone (DESYREL) tablet 200 mg  200 mg Oral QHS Rachael Fee, MD   200 mg at 09/15/12 2230    Lab Results: No results found for this or any previous visit (from the past 48 hour(s)).  Physical Findings: AIMS: Facial and Oral Movements Muscles of Facial Expression: None, normal Lips and Perioral Area: None, normal Jaw: None, normal Tongue: None, normal,Extremity Movements Upper (arms, wrists, hands, fingers): None, normal Lower (legs, knees, ankles, toes): None, normal, Trunk Movements Neck, shoulders, hips: None, normal, Overall Severity Severity of abnormal movements (highest score from questions above): None, normal Incapacitation due to abnormal movements: None, normal Patient's awareness of abnormal movements (rate only patient's report): No Awareness, Dental Status Current problems with teeth and/or dentures?: No Does patient usually wear dentures?: No  CIWA:  CIWA-Ar Total: 0 COWS:  COWS Total Score: 3  Treatment Plan Summary: Daily contact with patient to assess and evaluate symptoms and progress in treatment Medication management  Plan: Supportive approach/coping skills/relapse prevention           Continue to optimize treatment with psychotropics   Medical Decision Making Problem Points:  Review of psycho-social stressors (1) Data Points:  Review of medication regiment & side effects (2) Review of new medications or change in dosage (2)  I certify that inpatient services furnished can reasonably be expected to improve the patient's condition.   Kisha Messman A 09/16/2012, 3:13 PM

## 2012-09-16 NOTE — Progress Notes (Signed)
Patient did attend the evening speaker AA meeting.  

## 2012-09-17 DIAGNOSIS — F10239 Alcohol dependence with withdrawal, unspecified: Principal | ICD-10-CM

## 2012-09-17 DIAGNOSIS — F1220 Cannabis dependence, uncomplicated: Secondary | ICD-10-CM

## 2012-09-17 DIAGNOSIS — F1420 Cocaine dependence, uncomplicated: Secondary | ICD-10-CM

## 2012-09-17 DIAGNOSIS — F329 Major depressive disorder, single episode, unspecified: Secondary | ICD-10-CM

## 2012-09-17 DIAGNOSIS — F1020 Alcohol dependence, uncomplicated: Secondary | ICD-10-CM

## 2012-09-17 MED ORDER — HYDROXYZINE HCL 25 MG PO TABS: ORAL_TABLET | ORAL | Status: DC

## 2012-09-17 MED ORDER — TRAZODONE HCL 100 MG PO TABS: 200.0000 mg | ORAL_TABLET | Freq: Every day | ORAL | Status: DC

## 2012-09-17 MED ORDER — GABAPENTIN 300 MG PO CAPS: 300.0000 mg | ORAL_CAPSULE | Freq: Three times a day (TID) | ORAL | Status: DC

## 2012-09-17 MED ORDER — TESTOSTERONE 50 MG/5GM (1%) TD GEL: 5.0000 g | Freq: Every day | TRANSDERMAL | Status: DC

## 2012-09-17 MED ORDER — ESOMEPRAZOLE MAGNESIUM 40 MG PO CPDR: DELAYED_RELEASE_CAPSULE | ORAL | Status: DC

## 2012-09-17 MED ORDER — BUPROPION HCL ER (XL) 300 MG PO TB24: 300.0000 mg | ORAL_TABLET | Freq: Every day | ORAL | Status: DC

## 2012-09-17 MED ORDER — FINASTERIDE 1 MG PO TABS: 1.0000 mg | ORAL_TABLET | Freq: Every day | ORAL | Status: DC

## 2012-09-17 NOTE — Progress Notes (Signed)
Patient did not attend 0900 orientation group. 

## 2012-09-17 NOTE — Discharge Summary (Signed)
Physician Discharge Summary Note  Patient:  Steve Bass is an 32 y.o., male MRN:  [id number] DOB:  1980/06/09 Patient phone:  [phone] (home)  Patient address:   [address] [place] [address],   Date of Admission:  09/09/2012 Date of Discharge: 09/17/12  Reason for Admission:  Alcohol/drug detox  Discharge Diagnoses: Active Problems:   Polysubstance dependence   ADHD (attention deficit hyperactivity disorder)   Unspecified episodic mood disorder   Anxiety state, unspecified   Panic attacks   PTSD (post-traumatic stress disorder)  Review of Systems  Constitutional: Negative.   HENT: Negative.   Eyes: Negative.   Respiratory: Negative.   Cardiovascular: Negative.   Gastrointestinal: Negative.   Genitourinary: Negative.   Musculoskeletal: Negative.   Skin: Negative.   Neurological: Negative.   Endo/Heme/Allergies: Negative.   Psychiatric/Behavioral: Positive for depression (Stabilized with medication prior to discharge) and substance abuse (Alcoholism, cocaine dependence, THC dependence). Negative for suicidal ideas, hallucinations and memory loss. The patient is nervous/anxious (Stabilized with medication prior to discharge) and has insomnia (Stabilized with medication prior to discharge).    DSM5: Schizophrenia Disorders:  NA Obsessive-Compulsive Disorders:  NA Trauma-Stressor Disorders:  Posttraumatic Stress Disorder (309.81), ADHD Substance/Addictive Disorders:  Alcohol Related Disorder - Severe (303.90), Alcohol Withdrawal (291.81) and Cannabis Use Disorder - Severe (304.30), Cocaine dependence Depressive Disorders:  Major Depressive Disorder (296.99), Anxiety disorder  Axis Diagnosis:   AXIS I:  Alcohol Related Disorder - Severe (303.90), Alcohol Withdrawal (291.81) and Cannabis Use Disorder - Severe (304.30), Cocaine dependence, Major depression AXIS II:  Deferred AXIS III:   Past Medical History  Diagnosis Date  . ADD (attention deficit disorder  with hyperactivity)   . Anxiety and depression   . Shingles   . GERD (gastroesophageal reflux disease)     LA classification Grade C EE  . PTSD (post-traumatic stress disorder)   . Obesity    AXIS IV:  other psychosocial or environmental problems and Polysubstance abuse issues AXIS V:  62  Level of Care:  RTC  Hospital Course:  32 Y/O male with history of on going addiction who has been completely out of control. He has been using his Adderall a month prescription in 8 to 10 days. This time around he ran out and started drinking four locos, 3 of those per day sometimes vodka sometimes a pint. Has been drinking like that last 3-4 months. He had a DWI 6 days agoThis time around he drank started using crack cocaine. He got home he became agitated, combative and threatening. The police was called. Was also taking Hydrocodone for his hand that he was abusing too.  After admission assessment and evaluation, coupled with the UDS/toxicology results indicating (+) cocaine/THC and alcohol levels of 166.  It was then determined that Mr. Burch will need detoxification treatment to stabilize his system of alcohol/drug intoxication and to combat the withdrawal symptoms of these substances as well. And his discharge plans included a referral to a long term treatment facility ([facility]) in [place] for a more intense substance abuse treatment. Mr.Bryngelson was then started on Librium detoxification treatment protocols. He was also enrolled in group counseling sessions and activities where he was counseled, taught and learned coping skills that should help him after discharge to cope better, manage his substance abuse problems to maintain a much longer sobriety.  Besides the detoxification treatment protocol, patient also received Gabapentin 300 mg tid for anxiety/pain management, Welbutrin XL 150 mg Q bedtime depression and Hydroxyzine 25  mg tid for anxiety and 50 mg Q bedtime for anxiety/sleep and  Trazodone 100 mg Q bedtime for sleep. He was also enrolled and attended AA/NA meetings being offered and held on this unit. He has some previous and or identifiable medical conditions that required treatment and or monitoring. He received medication management for all those health issues as well. He was monitored closely for any potential problems that may arise as a result of and or during detoxification treatment. Patient tolerated his treatment regimen and detoxification treatment without any significant adverse effects and or reactions reported.  Patient attended treatment team meeting this am and met with the treatment team members. His reason for admission, present symptoms, substance abuse issues, response to treatment and discharge plans discussed. Patient endorsed that he is doing well and stable for discharge to pursue the next phase of his substance abuse treatment. It was agreed upon that he will continue substance abuse treatment at the [address] [facility] [place] in [place], [place]. Patient will leave this hospital after discharge and straight to [facility]. The address, date, time and contact information for this treatment center provided for patient in writing.   In addition to residential substance abuse treatment, Mr. Vasudevan is encouraged to join/attend AA/NA meetings being offered and held within his community. Upon discharge, patient adamantly denies suicidal, homicidal ideations, auditory, visual hallucinations, delusional thinking and or withdrawal symptoms. Patient left [facility] with all personal belongings in no apparent distress. He received 30 days worth prescriptions of his [facility] discharge medications. Transportation per family.  Consults:  psychiatry  Significant Diagnostic Studies:  labs: CBC with diff, CMP, UDS, Toxicology tests, U/A  Discharge Vitals:   Blood pressure 126/80, pulse 94, temperature 98.2 F (36.8 C), temperature source Oral, resp. rate 18, height 6' (1.829 m),  weight 140.615 kg (310 lb). Body mass index is 42.03 kg/(m^2). Lab Results:   No results found for this or any previous visit (from the past 72 hour(s)).  Physical Findings: AIMS: Facial and Oral Movements Muscles of Facial Expression: None, normal Lips and Perioral Area: None, normal Jaw: None, normal Tongue: None, normal,Extremity Movements Upper (arms, wrists, hands, fingers): None, normal Lower (legs, knees, ankles, toes): None, normal, Trunk Movements Neck, shoulders, hips: None, normal, Overall Severity Severity of abnormal movements (highest score from questions above): None, normal Incapacitation due to abnormal movements: None, normal Patient's awareness of abnormal movements (rate only patient's report): No Awareness, Dental Status Current problems with teeth and/or dentures?: No Does patient usually wear dentures?: No  CIWA:  CIWA-Ar Total: 0 COWS:  COWS Total Score: 3  Psychiatric Specialty Exam: See Psychiatric Specialty Exam and Suicide Risk Assessment completed by Attending Physician prior to discharge.  Discharge destination:  RTC  Is patient on multiple antipsychotic therapies at discharge:  No   Has Patient had three or more failed trials of antipsychotic monotherapy by history:  No  Recommended Plan for Multiple Antipsychotic Therapies: NA     Medication List    STOP taking these medications       amphetamine-dextroamphetamine 20 MG 24 hr capsule  Commonly known as:  ADDERALL XR     CLEAR EYES REDNESS RELIEF OP     clonazePAM 1 MG tablet  Commonly known as:  KLONOPIN     DULoxetine 60 MG capsule  Commonly known as:  CYMBALTA     gabapentin 600 MG tablet  Commonly known as:  NEURONTIN  Replaced by:  gabapentin 300 MG capsule  Hydrocodone-Acetaminophen 5-300 MG Tabs      TAKE these medications     Indication   buPROPion 300 MG 24 hr tablet  Commonly known as:  WELLBUTRIN XL  Take 1 tablet (300 mg total) by mouth daily. For depression    Indication:  Major Depressive Disorder     esomeprazole 40 MG capsule  Commonly known as:  NEXIUM  TAKE 1 CAPSULE (40 MG TOTAL) BY MOUTH DAILY BEFORE BREAKFAST: for acid reflux.   Indication:  Gastroesophageal Reflux Disease with Current Symptoms     finasteride 1 MG tablet  Commonly known as:  PROPECIA  Take 1 tablet (1 mg total) by mouth at bedtime. For alopecia   Indication:  Male Pattern Baldness     gabapentin 300 MG capsule  Commonly known as:  NEURONTIN  Take 1 capsule (300 mg total) by mouth 3 (three) times daily. For anxiety   Indication:  Agitation, Pain, Anxiety     hydrOXYzine 25 MG tablet  Commonly known as:  ATARAX/VISTARIL  Take 1 tablet (25 mg) three times daily & 2 tablets (50 mg) for anxiety/sleep   Indication:  Anxiety associated with Organic Disease, Sedation     testosterone 50 MG/5GM Gel  Commonly known as:  ANDROGEL  Place 5 g onto the skin daily. For low testosterone   Indication:  Androgen Deficiency     traZODone 100 MG tablet  Commonly known as:  DESYREL  Take 2 tablets (200 mg total) by mouth at bedtime. For sleep   Indication:  Trouble Sleeping       Follow-up Information   Follow up with [facility] On 09/17/2012. (Arrive for further inpatient treatment on this date)    Contact information:   [address] [place], [place] [address] Phone: [phone] Fax: [phone]     Follow-up recommendations:  Activity:  As tolerated Diet: As recommended by your primary care doctor. Keep all scheduled follow-up appointments as recommended.  Comments: Take all your medications as prescribed by your mental healthcare provider. Report any adverse effects and or reactions from your medicines to your outpatient provider promptly. Patient is instructed and cautioned to not engage in alcohol and or illegal drug use while on prescription medicines. In the event of worsening symptoms, patient is instructed to call the crisis hotline, 911 and or go to the  nearest ED for appropriate evaluation and treatment of symptoms. Follow-up with your primary care provider for your other medical issues, concerns and or health care needs.   Continue to work your relapse prevention plan Total Discharge Time:  Greater than 30 minutes.  Signed: Sanjuana Kava, PMHNP-BC 09/23/2012, 9:15 AM Agree with assessment and plan Reymundo Poll. Dub Mikes, M.D.

## 2012-09-17 NOTE — Progress Notes (Signed)
Patient ID: Steve Bass, male   DOB: [birth year], 32 y.o.   MRN: [id number] He has been discharged and was picked up by his parents to be taken to a treatment center.  He voiced understanding of discharge teaching about his medications and follow up plan. Parents and him were informed that he needed to take a 30 day supply medication with him. He spoke of not returning to [place] upon discharge . He plans to live in the [place] area and live a healthy life. He denies thought of SI and all belongings were taken home with him.

## 2012-09-17 NOTE — Progress Notes (Signed)
Patient resting quietly with eyes closed. Respirations even and unlabored. No distress noted, Q 15 minute check continues as ordered to maintain safety.  

## 2012-09-17 NOTE — [facility] Suicide Risk Assessment (Signed)
Suicide Risk Assessment  Discharge Assessment     Demographic Factors:  Male and Caucasian  Mental Status Per Nursing Assessment::   On Admission:  Suicidal ideation indicated by patient  Current Mental Status by Physician: In full contact with reality. There are no suicidal ideas, plans or intent. His mood is euthymic, his affect is appropriate. He is wiling and motivated to pursue further rehab and get his life back together.   Loss Factors: NA  Historical Factors: NA  Risk Reduction Factors:   Sense of responsibility to family, Positive social support and Positive therapeutic relationship  Continued Clinical Symptoms:  Panic Attacks Depression:   Comorbid alcohol abuse/dependence Alcohol/Substance Abuse/Dependencies  Cognitive Features That Contribute To Risk:  Thought constriction (tunnel vision)    Suicide Risk:  Minimal: No identifiable suicidal ideation.  Patients presenting with no risk factors but with morbid ruminations; may be classified as minimal risk based on the severity of the depressive symptoms  Discharge Diagnoses:   AXIS I:  Polysubstance dependence/ADHD/PTSD/Anxiety Disorder NOS/Mood Disorder NOS AXIS II:  Deferred AXIS III:   Past Medical History  Diagnosis Date  . ADD (attention deficit disorder with hyperactivity)   . Anxiety and depression   . Shingles   . GERD (gastroesophageal reflux disease)     LA classification Grade C EE  . PTSD (post-traumatic stress disorder)   . Obesity    AXIS IV:  other psychosocial or environmental problems AXIS V:  61-70 mild symptoms  Plan Of Care/Follow-up recommendations:  Activity:  as tolerated Diet:  regular Follow up [facility] [place] Is patient on multiple antipsychotic therapies at discharge:  No   Has Patient had three or more failed trials of antipsychotic monotherapy by history:  No  Recommended Plan for Multiple Antipsychotic Therapies: NA  Steve Bass A 09/17/2012, 9:45 AM

## 2012-09-17 NOTE — Progress Notes (Signed)
[facility] Adult Case Management Discharge Plan :  Will you be returning to the same living situation after discharge: No. [facility] At discharge, do you have transportation home?:Yes,  pt's mother transporting him to facility Do you have the ability to pay for your medications:Yes,  private insurance  Release of information consent forms completed and in the chart;  Patient's signature needed at discharge.  Patient to Follow up at: Follow-up Information   Follow up with [facility] On 09/17/2012. (Arrive for further inpatient treatment on this date)    Contact information:   [address] [place], [place] [address] Phone: [phone] Fax: [phone]      Patient denies SI/HI:   Yes,  during group/self report    Safety Planning and Suicide Prevention discussed:  Yes,  SPE completed with pt's mother. Pt given SPI pamphlet and encouraged to ask questions, talk about concerns,and share information with support system.  Smart, Abbye Lao 09/17/2012, 9:26 AM

## 2012-09-20 NOTE — Progress Notes (Signed)
Patient Discharge Instructions:  After Visit Summary (AVS):   Faxed to:  09/20/12 Discharge Summary Note:   Faxed to:  09/20/12 Psychiatric Admission Assessment Note:   Faxed to:  09/20/12 Suicide Risk Assessment - Discharge Assessment:   Faxed to:  09/20/12 Faxed/Sent to the Next Level Care provider:  09/20/12 Faxed to [facility] @ [phone]  Jerelene Redden, 09/20/2012, 3:28 PM

## 2012-10-19 ENCOUNTER — Emergency Department (HOSPITAL_COMMUNITY): Payer: BC Managed Care – PPO

## 2012-10-19 ENCOUNTER — Encounter (HOSPITAL_COMMUNITY): Payer: Self-pay | Admitting: Emergency Medicine

## 2012-10-19 ENCOUNTER — Emergency Department (HOSPITAL_COMMUNITY)
Admission: EM | Admit: 2012-10-19 | Discharge: 2012-10-19 | Disposition: A | Discharge status: Home / Self Care | Payer: BC Managed Care – PPO | Attending: Emergency Medicine | Admitting: Emergency Medicine

## 2012-10-19 DIAGNOSIS — K219 Gastro-esophageal reflux disease without esophagitis: Secondary | ICD-10-CM | POA: Insufficient documentation

## 2012-10-19 DIAGNOSIS — Z79899 Other long term (current) drug therapy: Secondary | ICD-10-CM | POA: Insufficient documentation

## 2012-10-19 DIAGNOSIS — Y9389 Activity, other specified: Secondary | ICD-10-CM | POA: Insufficient documentation

## 2012-10-19 DIAGNOSIS — S161XXA Strain of muscle, fascia and tendon at neck level, initial encounter: Secondary | ICD-10-CM

## 2012-10-19 DIAGNOSIS — F329 Major depressive disorder, single episode, unspecified: Secondary | ICD-10-CM | POA: Insufficient documentation

## 2012-10-19 DIAGNOSIS — F8089 Other developmental disorders of speech and language: Secondary | ICD-10-CM | POA: Insufficient documentation

## 2012-10-19 DIAGNOSIS — F17200 Nicotine dependence, unspecified, uncomplicated: Secondary | ICD-10-CM | POA: Insufficient documentation

## 2012-10-19 DIAGNOSIS — Z8659 Personal history of other mental and behavioral disorders: Secondary | ICD-10-CM | POA: Insufficient documentation

## 2012-10-19 DIAGNOSIS — Y92410 Unspecified street and highway as the place of occurrence of the external cause: Secondary | ICD-10-CM | POA: Insufficient documentation

## 2012-10-19 DIAGNOSIS — Z8619 Personal history of other infectious and parasitic diseases: Secondary | ICD-10-CM | POA: Insufficient documentation

## 2012-10-19 DIAGNOSIS — F909 Attention-deficit hyperactivity disorder, unspecified type: Secondary | ICD-10-CM | POA: Insufficient documentation

## 2012-10-19 DIAGNOSIS — F3289 Other specified depressive episodes: Secondary | ICD-10-CM | POA: Insufficient documentation

## 2012-10-19 DIAGNOSIS — F411 Generalized anxiety disorder: Secondary | ICD-10-CM | POA: Insufficient documentation

## 2012-10-19 DIAGNOSIS — S335XXA Sprain of ligaments of lumbar spine, initial encounter: Secondary | ICD-10-CM | POA: Insufficient documentation

## 2012-10-19 DIAGNOSIS — S139XXA Sprain of joints and ligaments of unspecified parts of neck, initial encounter: Secondary | ICD-10-CM | POA: Insufficient documentation

## 2012-10-19 DIAGNOSIS — S39012A Strain of muscle, fascia and tendon of lower back, initial encounter: Secondary | ICD-10-CM

## 2012-10-19 DIAGNOSIS — E669 Obesity, unspecified: Secondary | ICD-10-CM | POA: Insufficient documentation

## 2012-10-19 LAB — URINALYSIS, ROUTINE W REFLEX MICROSCOPIC
Glucose, UA: NEGATIVE mg/dL
Ketones, ur: 15 mg/dL — AB
Leukocytes, UA: NEGATIVE
Nitrite: NEGATIVE
Protein, ur: NEGATIVE mg/dL
Urobilinogen, UA: 0.2 mg/dL (ref 0.0–1.0)

## 2012-10-19 LAB — RAPID URINE DRUG SCREEN, HOSP PERFORMED
Amphetamines: POSITIVE — AB
Opiates: NOT DETECTED

## 2012-10-19 MED ORDER — METHOCARBAMOL 500 MG PO TABS: 500.0000 mg | ORAL_TABLET | Freq: Two times a day (BID) | ORAL | Status: DC
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
## 2012-10-19 NOTE — ED Notes (Signed)
Patient transported to X-ray 

## 2012-10-19 NOTE — ED Notes (Signed)
Patient returned from X-ray 
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
## 2012-10-19 NOTE — ED Notes (Signed)
Patient's father is here; reports son in treatment for opoid problem. Discussed this with PA and myself.

## 2012-10-19 NOTE — ED Notes (Signed)
Reminded patient of orders for urine; states he does not need to go at this time.

## 2012-10-20 ENCOUNTER — Ambulatory Visit (INDEPENDENT_AMBULATORY_CARE_PROVIDER_SITE_OTHER): Payer: BC Managed Care – PPO | Admitting: Internal Medicine

## 2012-10-20 VITALS — BP 110/80 | HR 104 | Temp 97.7°F | Resp 16 | Ht 73.0 in | Wt 288.0 lb

## 2012-10-20 DIAGNOSIS — F17200 Nicotine dependence, unspecified, uncomplicated: Secondary | ICD-10-CM

## 2012-10-20 DIAGNOSIS — F39 Unspecified mood [affective] disorder: Secondary | ICD-10-CM

## 2012-10-20 DIAGNOSIS — F411 Generalized anxiety disorder: Secondary | ICD-10-CM

## 2012-10-20 DIAGNOSIS — F1920 Other psychoactive substance dependence, uncomplicated: Secondary | ICD-10-CM

## 2012-10-20 DIAGNOSIS — F988 Other specified behavioral and emotional disorders with onset usually occurring in childhood and adolescence: Secondary | ICD-10-CM

## 2012-10-20 DIAGNOSIS — F410 Panic disorder [episodic paroxysmal anxiety] without agoraphobia: Secondary | ICD-10-CM

## 2012-10-20 DIAGNOSIS — F4310 Post-traumatic stress disorder, unspecified: Secondary | ICD-10-CM

## 2012-10-20 MED ORDER — METHYLPHENIDATE HCL ER (OSM) 54 MG PO TBCR: 54.0000 mg | EXTENDED_RELEASE_TABLET | ORAL | Status: DC

## 2012-10-20 MED ORDER — METHYLPHENIDATE HCL ER (OSM) 36 MG PO TBCR: 36.0000 mg | EXTENDED_RELEASE_TABLET | ORAL | Status: DC
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
## 2012-10-28 NOTE — ED Provider Notes (Signed)
Medical screening examination/treatment/procedure(s) were performed by non-physician practitioner and as supervising physician I was immediately available for consultation/collaboration.  Cynthia Stainback, MD 10/28/12 0917 

## 2012-11-19 ENCOUNTER — Telehealth: Payer: Self-pay | Admitting: Nurse Practitioner

## 2012-11-19 ENCOUNTER — Telehealth: Payer: Self-pay
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
## 2012-11-19 NOTE — Telephone Encounter (Signed)
Appt given

## 2012-11-19 NOTE — Telephone Encounter (Signed)
He was advised to follow up here or with Dr Dub Mikes, he has not followed up with Dr Dub Mikes, he is advised we will need to see him in the office for renewal, he states he understands.

## 2012-11-20 ENCOUNTER — Telehealth: Payer: Self-pay | Admitting: Nurse Practitioner

## 2012-11-20 NOTE — Telephone Encounter (Signed)
I can not give any information to the father, but I can listen to him. Called father to advise him we have the message, but his secretary will not let me speak to him. I left message for him to call me back.

## 2012-11-20 NOTE — Telephone Encounter (Signed)
Pt father is calling because his son was seen by [facility] on 10-05 and he was given some medications and has been abusing them, he had to go to rehab and he is now back home with his parents. His father is not on the HIPPA and the father understands but he is really concerned for his son so he wanted to send a message to the clinical staff to make aware of what is going on. He knows his son called yesterday and his son told him that he called for medical records but as i can see that he called for a refill on mediations I did not tell the father any different since he was not on the hippa however I just wanted to note what the father had said. The number that the father called from is  [phone] and his name is Bensyn as well.

## 2012-11-20 NOTE — Telephone Encounter (Signed)
Patient called regarding refill on Concerta. Patient notified and voiced understanding he will need to follow up with Dr. Merla Riches or Dr. Dub Mikes.

## 2012-11-20 NOTE — Telephone Encounter (Signed)
I have spoken to patients father. Patient has been in rehab but now is out of the rehab center in [place] and father concerned he has called here for medication. He is trying to get his son back in rehab center, and if he takes any medication he is not going to be able to get back into the program. I have not provided any information to the father, but did advise him Dr Merla Riches will be advised of the situation.

## 2012-11-20 NOTE — Telephone Encounter (Signed)
Absolutely can listen to father and in an instance like this I would invoke to "danger to himself" clause so that we can break HIPPA confidentiality---I won't refill his med without discussing with his father

## 2012-11-21 ENCOUNTER — Other Ambulatory Visit: Payer: Self-pay

## 2012-11-21 ENCOUNTER — Encounter (HOSPITAL_COMMUNITY): Payer: Self-pay | Admitting: Psychology

## 2012-11-21 NOTE — Telephone Encounter (Signed)
Thanks. I have called father left message for him to call me back.

## 2012-11-21 NOTE — Telephone Encounter (Signed)
Thanks. Father advised.

## 2012-11-23 ENCOUNTER — Ambulatory Visit (INDEPENDENT_AMBULATORY_CARE_PROVIDER_SITE_OTHER): Payer: BC Managed Care – PPO | Admitting: Internal Medicine

## 2012-11-23 VITALS — BP 126/96 | HR 100 | Temp 98.0°F | Resp 20 | Ht 70.0 in | Wt 286.8 lb

## 2012-11-23 DIAGNOSIS — E291 Testicular hypofunction: Secondary | ICD-10-CM

## 2012-11-23 DIAGNOSIS — F1910 Other psychoactive substance abuse, uncomplicated: Secondary | ICD-10-CM

## 2012-11-23 DIAGNOSIS — F1920 Other psychoactive substance dependence, uncomplicated: Secondary | ICD-10-CM

## 2012-11-23 LAB — COMPREHENSIVE METABOLIC PANEL
ALT: 80 U/L — ABNORMAL HIGH (ref 0–53)
Alkaline Phosphatase: 66 U/L (ref 39–117)
CO2: 26 mEq/L (ref 19–32)
Creat: 1.04 mg/dL (ref 0.50–1.35)
Glucose, Bld: 101 mg/dL — ABNORMAL HIGH (ref 70–99)
Total Bilirubin: 0.2 mg/dL — ABNORMAL LOW (ref 0.3–1.2)

## 2012-11-23 LAB — TSH: TSH: 1.233 u[IU]/mL (ref 0.350–4.500)

## 2012-11-23 LAB — POCT CBC
Hemoglobin: 13.4 g/dL — AB (ref 14.1–18.1)
Lymph, poc: 2.3 (ref 0.6–3.4)
MCH, POC: 28.5 pg (ref 27–31.2)
MCHC: 30.3 g/dL — AB (ref 31.8–35.4)
MPV: 8 fL (ref 0–99.8)
POC LYMPH PERCENT: 39.8 %L (ref 10–50)
POC MID %: 8.6 %M (ref 0–12)
RDW, POC: 15.3 %
WBC: 5.7 10*3/uL (ref 4.6–10.2)

## 2012-11-23 LAB — T4, FREE: Free T4: 1.14 ng/dL (ref 0.80–1.80)

## 2012-11-23 NOTE — Progress Notes (Signed)
This chart was scribed for [employer]. Merla Riches, MD by Caryn Bee, Medical Scribe. This patient was seen in Room/bed 10 and the patient's care was started at 12:30 PM.  Subjective:    Patient ID: Steve Bass, male    DOB: Sep 10, 1980, 32 y.o.   MRN: [id number]  HPI HPI Comments: Steve Bass is a 32 y.o. male who presents to [facility] requesting medication refill of 54 mg Concerta. He reports that his parents would regulate the Concerta. Pt states that he felt like the medication has helped him. He is also requesting testosterone shots or implants. He attends group therapy sessions for addiction, and reports being stable since last ov.  Pt has not had thyroid tests performed previously and he is requesting thyroid testing today due to fatigue. He has done intravenous drug use but has never been tested for various forms of Hepatitis(since 2010). Pt is declining this as well as HIV testing. He is also wondering about Suboxone. Dr Dub Mikes gave him a mname  Pt has family h/o DM and thyroid issues. Pt's PCP is Dr. Joette Catching.  However I know from outside discussions with other care givers that he abused his last concerta rx 10/4 and ended up committed for detox, then d/ced [facility] [place] where he was booted p 2 wk due to alcohol use after aa mtg. And tht Dr Dub Mikes saw him this am already, prescribed strattera-said no more scheduled meds for him. Advised against suboxone. Gave patch for hypogonadism. He is on last chance status with parents who will boot him if he uses again.  Review of Systems  Constitutional: Positive for fatigue. Negative for fever, activity change, appetite change and unexpected weight change.       Remains obese  Respiratory: Negative for chest tightness and shortness of breath.   Cardiovascular: Negative for chest pain and palpitations.  Gastrointestinal: Negative for abdominal pain.  Genitourinary: Negative for difficulty urinating.  Musculoskeletal: Negative  for back pain.  Neurological: Negative for dizziness and headaches.  Psychiatric/Behavioral:       Denies issues!!!   Past Surgical History  Procedure Laterality Date  . Wisdom tooth extraction     History   Social History  . Marital Status: Single    Spouse Name: N/A    Number of Children: 0  . Years of Education: N/A   Occupational History  . student    Social History Main Topics  . Smoking status: Current Some Day Smoker -- 1.00 packs/day for 15 years    Types: Cigarettes  . Smokeless tobacco: Never Used     Comment: tobacco handout given 02/21/2011  . Alcohol Use: 0.0 oz/week     Comment: 10 beers in each sitting  . Drug Use: Yes    Special: "Crack" cocaine, Marijuana  . Sexual Activity: Yes    Birth Control/ Protection: Condom   Other Topics Concern  . Not on file   Social History Narrative  . No narrative on file   Past Medical History  Diagnosis Date  . ADD (attention deficit disorder with hyperactivity)   . Anxiety and depression   . Shingles   . GERD (gastroesophageal reflux disease)     LA classification Grade C EE  . PTSD (post-traumatic stress disorder)   . Obesity     Family History  Problem Relation Age of Onset  . Adopted: Yes   Allergies  Allergen Reactions  . Aleve [Naproxen Sodium] Other (See Comments)    He  says he gets like a weird feeling   . Cefaclor Rash  . Toradol [Ketorolac Tromethamine] Rash  . Tramadol Rash      Objective:   Physical Exam  Nursing note and vitals reviewed. Constitutional: He is oriented to person, place, and time. He appears well-developed and well-nourished. No distress.  HENT:  Head: Normocephalic.  Eyes: Conjunctivae and EOM are normal. Pupils are equal, round, and reactive to light.  Neck: Normal range of motion. Neck supple. No thyromegaly present.  Cardiovascular: Normal rate, regular rhythm, normal heart sounds and intact distal pulses.  Exam reveals no gallop and no friction rub.   No murmur  heard. Pulmonary/Chest: Effort normal and breath sounds normal. No respiratory distress.  Abdominal: There is no tenderness.  Musculoskeletal: Normal range of motion. He exhibits no edema.  Lymphadenopathy:    He has no cervical adenopathy.  Neurological: He is alert and oriented to person, place, and time. No cranial nerve deficit.  Skin: Skin is warm and dry. He is not diaphoretic.  Psychiatric:  Judgement absent Thoughts are clouded by cravings Behavior is drug seeking Mood is insistent on getting some psychoactive subatance      Assessment & Plan:  Hypogonadism male - Plan: POCT CBC, Comprehensive metabolic panel, TSH, T4, free  Substance abuse - Plan: Comprehensive metabolic panel  Polysubstance dependence  Fatigue prob secondary  Results for orders placed in visit on 11/23/12  COMPREHENSIVE METABOLIC PANEL      Result Value Range   Sodium 137  135 - 145 mEq/L   Potassium 4.4  3.5 - 5.3 mEq/L   Chloride 103  96 - 112 mEq/L   CO2 26  19 - 32 mEq/L   Glucose, Bld 101 (*) 70 - 99 mg/dL   BUN 15  6 - 23 mg/dL   Creat 0.98  1.19 - 1.47 mg/dL   Total Bilirubin 0.2 (*) 0.3 - 1.2 mg/dL   Alkaline Phosphatase 66  39 - 117 U/L   AST 26  0 - 37 U/L   ALT----has been up in past 80 (*) 0 - 53 U/L   Total Protein 6.6  6.0 - 8.3 g/dL   Albumin 4.3  3.5 - 5.2 g/dL   Calcium 9.8  8.4 - 82.9 mg/dL  TSH      Result Value Range   TSH 1.233  0.350 - 4.500 uIU/mL  T4, FREE      Result Value Range   Free T4 1.14  0.80 - 1.80 ng/dL  POCT CBC      Result Value Range   WBC 5.7  4.6 - 10.2 K/uL   Lymph, poc 2.3  0.6 - 3.4   POC LYMPH PERCENT 39.8  10 - 50 %L   MID (cbc) 0.5  0 - 0.9   POC MID % 8.6  0 - 12 %M   POC Granulocyte 2.9  2 - 6.9   Granulocyte percent 51.6  37 - 80 %G   RBC 4.71  4.69 - 6.13 M/uL   Hemoglobin 13.4 (*) 14.1 - 18.1 g/dL   HCT, POC 56.2  13.0 - 53.7 %   MCV 93.8  80 - 97 fL   MCH, POC 28.5  27 - 31.2 pg   MCHC 30.3 (*) 31.8 - 35.4 g/dL   RDW, POC 86.5      Platelet Count, POC 289  142 - 424 K/uL   MPV 8.0  0 - 99.8 fL  Recommend hep c/HIV but he declines  Plan-- will offer no further meds/defer to Dr Dub Mikes for all rx  To restart [facility]   I have completed the patient encounter in its entirety as documented by the scribe, with editing by me where necessary. Orvis Stann P. Merla Riches, M.D.

## 2012-11-25 ENCOUNTER — Ambulatory Visit (INDEPENDENT_AMBULATORY_CARE_PROVIDER_SITE_OTHER): Payer: BC Managed Care – PPO

## 2012-11-25 ENCOUNTER — Telehealth: Payer: Self-pay | Admitting: Nurse Practitioner

## 2012-11-25 ENCOUNTER — Ambulatory Visit (INDEPENDENT_AMBULATORY_CARE_PROVIDER_SITE_OTHER): Payer: BC Managed Care – PPO | Admitting: General Practice

## 2012-11-25 ENCOUNTER — Other Ambulatory Visit (HOSPITAL_COMMUNITY): Payer: BC Managed Care – PPO | Attending: Psychiatry | Admitting: Psychology

## 2012-11-25 VITALS — BP 145/92 | HR 95 | Temp 97.6°F | Ht 73.0 in | Wt 292.0 lb

## 2012-11-25 DIAGNOSIS — M79609 Pain in unspecified limb: Secondary | ICD-10-CM

## 2012-11-25 DIAGNOSIS — M79641 Pain in right hand: Secondary | ICD-10-CM

## 2012-11-25 DIAGNOSIS — F1120 Opioid dependence, uncomplicated: Secondary | ICD-10-CM

## 2012-11-25 DIAGNOSIS — F1420 Cocaine dependence, uncomplicated: Secondary | ICD-10-CM

## 2012-11-25 DIAGNOSIS — F1020 Alcohol dependence, uncomplicated: Secondary | ICD-10-CM | POA: Insufficient documentation

## 2012-11-25 DIAGNOSIS — F1220 Cannabis dependence, uncomplicated: Secondary | ICD-10-CM | POA: Insufficient documentation

## 2012-11-25 NOTE — Patient Instructions (Addendum)
Hand Contusion °A hand contusion is a deep bruise on your hand area. Contusions are the result of an injury that caused bleeding under the skin. The contusion may turn blue, purple, or yellow. Minor injuries will give you a painless contusion, but more severe contusions may stay painful and swollen for a few weeks. °CAUSES  °A contusion is usually caused by a blow, trauma, or direct force to an area of the body. °SYMPTOMS  °· Swelling and redness of the injured area. °· Discoloration of the injured area. °· Tenderness and soreness of the injured area. °· Pain. °DIAGNOSIS  °The diagnosis can be made by taking a history and performing a physical exam. An X-ray, CT scan, or MRI may be needed to determine if there were any associated injuries, such as broken bones (fractures). °TREATMENT  °Often, the best treatment for a hand contusion is resting, elevating, icing, and applying cold compresses to the injured area. Over-the-counter medicines may also be recommended for pain control. °HOME CARE INSTRUCTIONS  °· Put ice on the injured area. °· Put ice in a plastic bag. °· Place a towel between your skin and the bag. °· Leave the ice on for 15-20 minutes, 03-04 times a day. °· Only take over-the-counter or prescription medicines as directed by your caregiver. Your caregiver may recommend avoiding anti-inflammatory medicines (aspirin, ibuprofen, and naproxen) for 48 hours because these medicines may increase bruising. °· If told, use an elastic wrap as directed. This can help reduce swelling. You may remove the wrap for sleeping, showering, and bathing. If your fingers become numb, cold, or blue, take the wrap off and reapply it more loosely. °· Elevate your hand with pillows to reduce swelling. °· Avoid overusing your hand if it is painful. °SEEK IMMEDIATE MEDICAL CARE IF:  °· You have increased redness, swelling, or pain in your hand. °· Your swelling or pain is not relieved with medicines. °· You have loss of feeling in  your hand or are unable to move your fingers. °· Your hand turns cold or blue. °· You have pain when you move your fingers. °· Your hand becomes warm to the touch. °· Your contusion does not improve in 2 days. °MAKE SURE YOU:  °· Understand these instructions. °· Will watch your condition. °· Will get help right away if you are not doing well or get worse. °Document Released: 06/24/2001 Document Revised: 09/27/2011 Document Reviewed: 06/26/2011 °ExitCare® Patient Information ©2014 [employer]. °RICE: Routine Care for Injuries °The routine care of many injuries includes Rest, Ice, Compression, and Elevation (RICE). °HOME CARE INSTRUCTIONS °· Rest is needed to allow your body to heal. Routine activities can usually be resumed when comfortable. Injured tendons and bones can take up to 6 weeks to heal. Tendons are the cord-like structures that attach muscle to bone. °· Ice following an injury helps keep the swelling down and reduces pain. °· Put ice in a plastic bag. °· Place a towel between your skin and the bag. °· Leave the ice on for 15-20 minutes, 03-04 times a day. Do this while awake, for the first 24 to 48 hours. After that, continue as directed by your caregiver. °· Compression helps keep swelling down. It also gives support and helps with discomfort. If an elastic bandage has been applied, it should be removed and reapplied every 3 to 4 hours. It should not be applied tightly, but firmly enough to keep swelling down. Watch fingers or toes for swelling, bluish discoloration, coldness, numbness, or excessive   pain. If any of these problems occur, remove the bandage and reapply loosely. Contact your caregiver if these problems continue. °· Elevation helps reduce swelling and decreases pain. With extremities, such as the arms, hands, legs, and feet, the injured area should be placed near or above the level of the heart, if possible. °SEEK IMMEDIATE MEDICAL CARE IF: °· You have persistent pain and swelling. °· You  develop redness, numbness, or unexpected weakness. °· Your symptoms are getting worse rather than improving after several days. °These symptoms may indicate that further evaluation or further X-rays are needed. Sometimes, X-rays may not show a small broken bone (fracture) until 1 week or 10 days later. Make a follow-up appointment with your caregiver. Ask when your X-ray results will be ready. Make sure you get your X-ray results. °Document Released: 04/16/2000 Document Revised: 03/27/2011 Document Reviewed: 06/03/2010 °ExitCare® Patient Information ©2014 [employer]. ° °

## 2012-11-25 NOTE — Telephone Encounter (Signed)
EARLIER MESSAGE PT ALREADY SEEN

## 2012-11-25 NOTE — Progress Notes (Signed)
  Subjective:    Patient ID: ARNOL MCGIBBON, male    DOB: February 07, 1980, 32 y.o.   MRN: [id number]  Hand Pain  The incident occurred 12 to 24 hours ago. The incident occurred at home. The pain is present in the right hand. The quality of the pain is described as aching. The pain radiates to the right arm. The pain is at a severity of 9/10. The pain is moderate. The pain has been constant since the incident. Associated symptoms include numbness and tingling. Pertinent negatives include no chest pain. The symptoms are aggravated by movement and palpation. He has tried ice, NSAIDs and acetaminophen for the symptoms.      Review of Systems  Respiratory: Negative for chest tightness.   Cardiovascular: Negative for chest pain.  Musculoskeletal:       Right hand pain  Neurological: Positive for tingling and numbness. Negative for dizziness, weakness and headaches.       Objective:   Physical Exam  Constitutional: He is oriented to person, place, and time. He appears well-developed and well-nourished.  Cardiovascular: Normal rate, regular rhythm and normal heart sounds.   Pulmonary/Chest: Effort normal and breath sounds normal. No respiratory distress. He exhibits no tenderness.  Musculoskeletal: He exhibits edema and tenderness.  Right hand edema, dorsal side. Tenderness with palpation. +2 radial pulse, capillary refill, less 3 seconds.   Neurological: He is alert and oriented to person, place, and time.  Skin: Skin is warm and dry.  Psychiatric: He has a normal mood and affect.          Assessment & Plan:  1. Hand pain, right - DG Hand Complete Right; Future -Patient denies being seen for hand pain, but records were received via fax, from [facility], which indicated patient was seen there on 11/24/12 for right hand pain. Negative for fracture or dislocation -Patient is requesting hydrocodone for his pain and discomfort -Provider discussed that narcotics wouldn't be  prescribed at this time, but a referral could be made to ortho if his hand was hurting that much, he should be seen by an ortho specialist -Patient declined referral  -Discussed and provided information sheet on RICE and hand pain -Patient verbalized understanding Coralie Keens, FNP-C

## 2012-11-26 ENCOUNTER — Encounter: Payer: Self-pay | Admitting: Internal Medicine

## 2012-11-27 ENCOUNTER — Other Ambulatory Visit (HOSPITAL_COMMUNITY): Payer: BC Managed Care – PPO | Attending: Psychiatry | Admitting: Psychology

## 2012-11-27 DIAGNOSIS — F1020 Alcohol dependence, uncomplicated: Secondary | ICD-10-CM | POA: Insufficient documentation

## 2012-11-27 DIAGNOSIS — F1420 Cocaine dependence, uncomplicated: Secondary | ICD-10-CM

## 2012-11-27 DIAGNOSIS — F1120 Opioid dependence, uncomplicated: Secondary | ICD-10-CM

## 2012-11-28 ENCOUNTER — Encounter (HOSPITAL_COMMUNITY): Payer: Self-pay | Admitting: Psychology

## 2012-11-28 DIAGNOSIS — F1594 Other stimulant use, unspecified with stimulant-induced mood disorder: Secondary | ICD-10-CM | POA: Insufficient documentation

## 2012-11-28 DIAGNOSIS — F1120 Opioid dependence, uncomplicated: Secondary | ICD-10-CM | POA: Insufficient documentation

## 2012-11-28 NOTE — Progress Notes (Signed)
    Daily Group Progress Note  Program: CD-IOP   Group Time: 1-2:30 pm  Participation Level: Active  Behavioral Response: Sharing  Type of Therapy: Psycho-education Group  Topic: Psycho-Ed: The first part of group as spent in a visit from the Chaplain. Blain Pais appeared for group and introduced the topic of 'Forgiveness'. The chaplain agreed that this is a very difficult issue and one that many struggle with. When members were invited to share their feeling, there were many interpretations about forgiveness and the difficulties they have had with it. The chaplain challenged members to identify what they were feeling 'right now'. The discussion was intense and very revealing. The group responded well to this entire experience.  Group Time: 2:45- 4pm  Participation Level: Active  Behavioral Response: Sharing  Type of Therapy: Process Group  Topic: Group Process/Graduation: The second half of group was spent in process. Members shared about their issues and concerns in early recovery. I challenged one member who had tried to avoid coming to group today. His mother had contacted me and she had brought him. The patient admitted he had been craving and wanted to stay out today and smoke a rock. This disclosure generated more conversation about cravings and relapse prevention strategies. As the session neared the end, a graduation ceremony was held honoring a departing member. There were kind words of hope and encouragement shared and the session proved effective for all present.   Summary: The patient was attentive and shared openly in the session. He pointed to the 12-steps and admitted that if and when he ever had to make amends, he would like himself and the other person to be drunk or high. "It would be much easier". He also shared that he had had 'using dreams' and this had freaked him out. The patient received some good feedback from the chaplain. In process, I  challenged this patient about 'doctor shopping' and what he was doing today trying to get out of group? He admitted he had been wanting to use and was going to try and get out of coming to group today and smoke a 'rock'. The patient was quick to note that he likes everybody in the group and he isn't saying anything about them, but he was wanting to get high. Members assured him that they weren't taking it personally. This disclosure invited others who were struggling to share about themselves. He invited the newest group member to work out with him sometime and agreed that working out would get his endorphins released and make him feel better. The patient is struggling with cravings in early recovery. We will continue to follow closely in the days ahead.    Family Program: Family present? No   Name of family member(s):   UDS collected: No Results:   AA/NA attended?: [place] and Tuesday  Sponsor?: Yes, he has secured a temporary sponsor here in [place]   [place], LCAS

## 2012-11-29 ENCOUNTER — Other Ambulatory Visit (HOSPITAL_COMMUNITY): Payer: BC Managed Care – PPO | Attending: Psychiatry

## 2012-11-29 DIAGNOSIS — F1920 Other psychoactive substance dependence, uncomplicated: Secondary | ICD-10-CM | POA: Insufficient documentation

## 2012-12-01 ENCOUNTER — Encounter (HOSPITAL_COMMUNITY): Payer: Self-pay | Admitting: Psychology

## 2012-12-01 NOTE — Progress Notes (Signed)
    Daily Group Progress Note  Program: CD-IOP   Group Time: 1-2:30 pm  Participation Level: Active  Behavioral Response: Appropriate and Sharing  Type of Therapy: Process Group  Topic: Group Process; the first part of group was spent in process. Members shared about the past weekend while three new group members introduced themselves. No one had relapsed since the last group session, despite struggles and frustrations over the weekend. The new members received good feedback and seemed relaxed and comfortable in their first group session.  Group Time: 2:45-4 pm Participation Level: Minimal  Behavioral Response: Sharing  Type of Therapy: Psycho-education Group  Topic: Psycho-Ed: the second half of group was spent in a psycho-ed session. The 'Recovery Pie" was introduced/reviewed with members identifying the 8 primary elements of early recovery. Later in the session, the goals of group therapy were reviewed and members shared how 'cathartic' sharing a part of themselves have been. There was good disclosure and feedback among members, new and old.   Summary: The patient was new to group and introduced himself in the first session. The patient explained what had happened to him and why he is back in [place] with his parents and no longer in the therapeutic community in [place]. He reported he had attended an AA meeting on Saturday and gone to church on Sunday. The patient made some good comments and responded well to this first intervention. His sobriety date is 11/1.  Family Program: Family present? No   Name of family member(s):   UDS collected: No Results:   AA/NA attended?: [place]  Sponsor?: Yes   Joelys Staubs, LCAS

## 2012-12-02 ENCOUNTER — Other Ambulatory Visit (HOSPITAL_COMMUNITY): Payer: BC Managed Care – PPO | Attending: Psychiatry | Admitting: Psychology

## 2012-12-02 DIAGNOSIS — F1020 Alcohol dependence, uncomplicated: Secondary | ICD-10-CM | POA: Insufficient documentation

## 2012-12-02 DIAGNOSIS — F1120 Opioid dependence, uncomplicated: Secondary | ICD-10-CM

## 2012-12-02 DIAGNOSIS — F1420 Cocaine dependence, uncomplicated: Secondary | ICD-10-CM

## 2012-12-03 ENCOUNTER — Encounter (HOSPITAL_COMMUNITY): Payer: Self-pay | Admitting: Psychology

## 2012-12-03 NOTE — Progress Notes (Unsigned)
Patient ID: Steve Bass, male   DOB: 10/30/1980, 30 y.o.   MRN: [id number] CD-IOP: Treatment Planning Session. I met with the patient and his mother this afternoon. We were meeting in order to identify his goals of treatment. I expressed concerns about his motivation for recovery and questioned whether he had told his parents about his relapse? He had admitted yesterday in group that he had found some pot and a small pipe in a sock on Saturday morning and smoked it. Today, he admitted he had not told them about this use. I asked about the Suboxone and wondered why he had not shared that he had gotten a taper script from Dr. Evelene Croon just last week? He had little to offer and reported that he had last used heroin on November 1rst, after he was kicked out of the [facility] in [place]. I reminded him he had not reported that use previously and had been reporting a sobriety date of 11/1. I was quick to point out the many discrepancies in his many stories. I informed the patient that I had spoken to one of the director's at [facility] and had told them about the Suboxone. They were not pleased at all with this news and the director explained that his return in early December was discussed as a possibility and is certainly not for sure. In addition, the likelihood is not made greater by the information about the Suboxone. The patient got teary and defensive with this disclosure and reported he really wanted to be back there because it was really structured and what he needed. We agreed that we would do everything we can to get him to a place where he will be a desired resident and be allowed to return. As for treatment goals, the patient reported he wanted to remain sober, build support for his recovery here in this area as well as in [place], and work on becoming more honest. The documentation was reviewed, signed, and the treatment plan completed accordingly. The patient agreed that he would be back tomorrow for  group. We will continue to follow closely in the days ahead.

## 2012-12-03 NOTE — Progress Notes (Signed)
Daily Group Progress Note  Program: CD-IOP   Group Time: 1-2:30 pm  Participation Level: Active  Behavioral Response: Sharing, Rationalizing, Evasive and Minimizing  Type of Therapy: Process Group  Topic: Process: The first part of group was spent in process. Members shared about the past weekend and any events, people, or incidents that may have proven challenging or difficult in early recovery. One member described temptations on Friday night and she has finally come to realize that she is not able to be around alcohol or other drugs. Another member pointed out that this was Step One. Another member described a chain of events, including finding some cannabis and a small pipe in some rolled up socks. Later he was also tempted to see Xanax after an emotional meeting with a dying grandparent. The session proved compelling with power depth of disclosure among members.    Group Time: 2:45- 4pm  Participation Level: Active  Behavioral Response: Sharing  Type of Therapy: Psycho-education Group  Topic: Psycho-Ed: The second part of group was spent watching excerpts from a DVD entitled, "Drunks". They consisted of scenes from an AA meeting with a speaker and, subsequently, other AA members sharing their stories. The DVD was paused with members sharing their observations and insights into the characters in the film. As the second half began, one member, who was being discharged, decided to leave instead of staying for the remainder of the session and saying proper farewells to his fellow group members. He was angry and seemed unable to grasp the benefits of remaining for the entire session.   Summary: the patient he had traveled to [facility] to see his grandparents. He reported he hadn't seen his grandmother in quite some time and she is deteriorating with MS. He was shocked at how bad she was and admitted it was very upsetting. The patient admitted that when they returned to his grandfather's  condo, he raced up the stairs hoping to get in before anyone else and find some Xanax. He admitted he was just opening the bottle when his father appeared at the door. He removed the bottle and pills from his hands. The  patient admitted he would have taken them if his father hadn't been so quick behind him. I worked on helping him identify what his feelings were that were driving him to seek the drugs as a way to numb himself? He admitted he was saddened by his grandmother's severe deterioration since he had last seen her. He was also mad at himself or not having been to visit more often than he had in the past. He also admitted that on Saturday morning, he had found some pot and a small pipe in a sock he had rolled them up in. He smoked it and made no effort to call his parents in the house or try to circumvent the relapse. He displayed little insight and had lied when he checked-in with a sobriety date of 11/1. I pointed out he could have been honest and reported a sobriety date of 11/16 instead. It was unclear whether he even considered smoking the pot a relapse? The patient was questioned by another group member who observed that he seemed to be sitting on the fence about his recovery? The patient agreed that he feels like that a lot. He did not mention anything about the Suboxone that his mother had told me he was taking last Friday evening when we talked. This patient appears to be on the verge of relapsing  any moment and not the pillar of strength and insight he has presented himself as. We will continue to challenge him in the days ahead.    Family Program: Family present? No   Name of family member(s):   UDS collected: Yes Results:not returned from lab  AA/NA attended?: No  Sponsor?: Yes, but his sponsor is in [place], LCAS

## 2012-12-04 ENCOUNTER — Other Ambulatory Visit (HOSPITAL_COMMUNITY): Payer: BC Managed Care – PPO | Attending: Psychiatry | Admitting: Psychology

## 2012-12-04 ENCOUNTER — Ambulatory Visit: Payer: BC Managed Care – PPO | Admitting: Nurse Practitioner

## 2012-12-04 DIAGNOSIS — F1420 Cocaine dependence, uncomplicated: Secondary | ICD-10-CM

## 2012-12-04 DIAGNOSIS — F1020 Alcohol dependence, uncomplicated: Secondary | ICD-10-CM | POA: Insufficient documentation

## 2012-12-04 DIAGNOSIS — F1120 Opioid dependence, uncomplicated: Secondary | ICD-10-CM

## 2012-12-05 ENCOUNTER — Encounter (HOSPITAL_COMMUNITY): Payer: Self-pay | Admitting: Psychology

## 2012-12-05 NOTE — Progress Notes (Signed)
    Daily Group Progress Note  Program: CD-IOP   Group Time: 1-2:30 pm  Participation Level: Active  Behavioral Response: Sharing, Resistant and Evasive  Type of Therapy: Process Group  Topic: Group Process; the first part of group was spent in process. A new group member was present today and he shared a little about himself. One member admitted she was anxious because she has just realized how powerful this disease really is. She had been going to meetings and working her daily recovery, but she admitted that it is kind of scary for her. Other group members shared about this issue and others in early recovery. A member was asked to read the 'Thought for the Day' and it addressed this very issue and the importance of reaching out for support. There was good feedback and disclosure among members.   Group Time: 2:45- 4pm  Participation Level: Active  Behavioral Response: Sharing  Type of Therapy: Psycho-education Group  Topic: Psycho-Ed/Graduation: Handout on "I am Your Disease". Group members passed around a handout and then took turns reading it. It proved very powerful and tied in well with the previous session's conversations. At the conclusion of this session, members observed a graduation. One of the members was leaving after today, having completed the program successfully. There were kind words of hope and encouragement and he seemed genuinely touched by the emotion shared by his fellow group members.   Summary: The patient reported he had attended meetings and worked out 30 minutes on cardio. The patient agreed with another member about the seriousness of this addiction and noted that he knew a number of people who had died from addiction. I provided members with copies of their most recent drug tests, including one for this patient. I wondered what he could identify on his test from Monday? The patient identifed Suboxone and Cannabis, which was expected, but had no explanation for  the Klonipin. When questioned further, the patient admitted he had taken some over the weekend. I pointed out that he continues to hold back and no disclose fully, he admitted that this was true. The group provided feedback when I shared that this patient was afraid to share the truth about himself because he thought they would judge him. The group insisted there was no judgement and encouraged him to open up entirely and get this stuff out of him. "It will only make you drunk", a group member reminded him. The patient welcomed the support from his fellow group members. During the graduation this man provided kind words of respect and expressed his hopes for continued sobriety and success for the graduating member. As the session ended, I instructed this patient to disclose to his mother, who was waiting for him in the parking lot, to tell her that he had also taken Klonipin over the weekend. He had not shared that previously with her.  The patient's sobriety date remains 11/16.   Family Program: Family present? No   Name of family member(s):   UDS collected: Yes Results: not back yet  AA/NA attended?: Yes, Monday and Tuesday  Sponsor?: Yes, but he is in [place], LCAS

## 2012-12-05 NOTE — Progress Notes (Unsigned)
Patient ID: Steve Bass, male   DOB: 30-Oct-1980, 30 y.o.   MRN: [id number] Orientation to CD-IOP: The patient is a 32 yo single, Caucasian, male seeking treatment for his chemical dependency. The patient is currently living with his parents in [place], [place]. The patient has a long history of addiction and has had two inpatient treatment experiences. He most recently completed 28 days of residential treatment at [facility] [address] [facility] in [place], [place]. Upon discharge from this facility, he entered the [facility]" program, a long term therapeutic community in [place], [place]. He had only been there 2 weeks when he relapsed on vodka on October 31rst - Halloween. He was given a breathalyzer upon return to the facility that night, blew a .02, and was discharged immediately. He has returned to live with his parents temporarily and explained that he is anticipating returning to [facility] in early December. His parents wanted to insure he was remaining engaged and compliant in recovery and sought out this program. The patient was adopted and reported his biological father was chemically dependent. The patient admitted he was an alcoholic the first time he drank and had a very high tolerance in his teens. He admitted having a pending DUI in [place]. The patient had a BAC of .42. The patient reported he had been sober 45 days prior to the relapse on vodka on October 31rst. His longest period of sobriety was 2 years when he was on probation. The patient admitted his primary drug of addiction is crack cocaine. He is also an IV drug user of cocaine and heroin. He has also used alcohol and cannabis for years.  The patient reported a long history of anxiety and he is currently using an 'Alpha-Stim" every other day for 40 minutes and it proves very helpful in reducing his anxiety. The patient displayed good motivation about his recovery and stated he had really liked living at [facility] and suggested they  were anticipating his return in December. The documentation was reviewed, signed, and the orientation completed accordingly. The patient will return on Monday and begin the CD-IOP.

## 2012-12-06 ENCOUNTER — Other Ambulatory Visit (HOSPITAL_COMMUNITY): Payer: BC Managed Care – PPO | Attending: Psychiatry | Admitting: Psychology

## 2012-12-06 DIAGNOSIS — F411 Generalized anxiety disorder: Secondary | ICD-10-CM | POA: Insufficient documentation

## 2012-12-06 DIAGNOSIS — F909 Attention-deficit hyperactivity disorder, unspecified type: Secondary | ICD-10-CM | POA: Insufficient documentation

## 2012-12-06 DIAGNOSIS — F603 Borderline personality disorder: Secondary | ICD-10-CM | POA: Diagnosis not present

## 2012-12-06 DIAGNOSIS — F3289 Other specified depressive episodes: Secondary | ICD-10-CM | POA: Insufficient documentation

## 2012-12-06 DIAGNOSIS — E669 Obesity, unspecified: Secondary | ICD-10-CM | POA: Insufficient documentation

## 2012-12-06 DIAGNOSIS — K219 Gastro-esophageal reflux disease without esophagitis: Secondary | ICD-10-CM | POA: Insufficient documentation

## 2012-12-06 DIAGNOSIS — Z598 Other problems related to housing and economic circumstances: Secondary | ICD-10-CM | POA: Insufficient documentation

## 2012-12-06 DIAGNOSIS — F1920 Other psychoactive substance dependence, uncomplicated: Secondary | ICD-10-CM | POA: Diagnosis not present

## 2012-12-06 DIAGNOSIS — Z5987 Material hardship due to limited financial resources, not elsewhere classified: Secondary | ICD-10-CM | POA: Insufficient documentation

## 2012-12-06 DIAGNOSIS — F329 Major depressive disorder, single episode, unspecified: Secondary | ICD-10-CM | POA: Diagnosis not present

## 2012-12-06 DIAGNOSIS — F1423 Cocaine dependence with withdrawal: Secondary | ICD-10-CM

## 2012-12-06 DIAGNOSIS — IMO0002 Reserved for concepts with insufficient information to code with codable children: Secondary | ICD-10-CM | POA: Diagnosis not present

## 2012-12-06 DIAGNOSIS — F4310 Post-traumatic stress disorder, unspecified: Secondary | ICD-10-CM | POA: Diagnosis not present

## 2012-12-06 DIAGNOSIS — F1123 Opioid dependence with withdrawal: Secondary | ICD-10-CM
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
## 2012-12-09 ENCOUNTER — Other Ambulatory Visit (HOSPITAL_COMMUNITY): Payer: BC Managed Care – PPO | Attending: Psychiatry | Admitting: Psychology

## 2012-12-09 ENCOUNTER — Encounter (HOSPITAL_COMMUNITY): Payer: Self-pay | Admitting: Psychology

## 2012-12-09 DIAGNOSIS — F1120 Opioid dependence, uncomplicated: Secondary | ICD-10-CM | POA: Insufficient documentation

## 2012-12-09 DIAGNOSIS — F1420 Cocaine dependence, uncomplicated: Secondary | ICD-10-CM

## 2012-12-09 NOTE — Progress Notes (Signed)
° ° °  Daily Group Progress Note  Program: CD-IOP   Group Time: 1-2:30 pm  Participation Level: Active  Behavioral Response: Sharing and Rationalizing  Type of Therapy: Process Group  Topic: Group Process: the first part of group was spent in process. Members shared about current issues and concerns in early recovery. They were also asked to check-in with sobriety date and what they had done since the last session to support their recovery. There as a good discussion and different ideas and suggests made among members. During this session, the PA, CK, was meeting with new patients for the first time.   Group Time:  2:45 4pm  Participation Level: Active  Behavioral Response: Sharing  Type of Therapy: Psycho-education Group  Topic: Relapse Prevention: the second half of group was spent in a psycho-ed on relapse prevention. A segment from the [employer] Addiction DVD was played and it explained the chemical changes that occur in the brain when one is exposed to triggers. the changes that occur, including the release of dopamine, was highlighted and provided the group members with a clearer understanding of how cravings are generated. The ensuing discussion focused on how these members could avoid those situations and circumstances that generate dopamine discharge and lead to cravings. As the session neared the end, each group member shared his/her plans for the weekend and how they intended to remain abstinent and support their recovery. The session proved engaging with every member providing input and sharing strategies.   Summary: The patient reported he was doing well and had gone to the gym twice since the last group session. He noted that he had done over 30 minutes of cardio during the last visit. He has previously shared an inclination to lift weight, but avoid cardio despite the fact that he would feel much better about himself if he lost about 30 lbs. The patient was excited and reported he  was going snowboarding with his father this weekend in [place]. He agreed that it is really a rush to go flying down the mountain and he is really excited about it. During this half of group the patient was invited to meet with the medical director for his first assessment during the program. In the psycho-ed, the patient admitted he had experienced a craving yesterday and could actually taste the drug. He had focused his attention on being present and to try and concentrate on the moment. The patient provided good feedback to his fellow group members and made some good comments during the discussion on the DVD. His stated sobriety date is 11/16.    Family Program: Family present? No   Name of family member(s):   UDS collected: No Results:   AA/NA attended?: YesThursday  Sponsor?: Yes, but the sponsor is in [place], [place]   [place], LCAS

## 2012-12-10 ENCOUNTER — Encounter (HOSPITAL_COMMUNITY): Payer: Self-pay | Admitting: Psychology

## 2012-12-10 NOTE — Progress Notes (Signed)
    Daily Group Progress Note  Program: CD-IOP   Group Time: 1-2:30 pm  Participation Level: Active  Behavioral Response: Appropriate and Sharing  Type of Therapy: Process Group  Topic:Group Process: the first part of group was spent in process. Members shared about the past weekend and things they do to support their recovery. Two new group members were present and introduced themselves over the course of their first group sessions. At least two members seemed to be 'making excuses' for not attending any 12-Step meetings and they were challenged about this. Other members emphasized how much the Fellowship has helped them. Prior to the break, members were given copies of "The Feeling Wheel" and were asked to identify the feelings they were aware of at this moment. Some have a difficult time doing this while others identify up to 10 feelings listened on the wheel. There was good discussion and feedback among the group.    Group Time: 2:45- 4pm  Participation Level: Active  Behavioral Response: Sharing  Type of Therapy: Psycho-education Group  Topic: Triggers and Relapse: a Slide Show was presented in the second half of group. This presentation, from [facility], identified the process of addiction through the reinforcement that comes through continued use. Members shared their experiences and provided examples as the slide show progressed. The importance of understanding how triggers develop was emphasized. Eliminating triggers when possible and then learning to deal more effectively with feelings that are triggered was emphasized as a significant part of recovery. The session invited feedback and discussion among members.   Summary: The patient reported he had had a great time snowboarding in the mountains this weekend. He showed his upper arm under his t-shirt and it had a big bruise where he had 'wiped out' during one of the runs. The patient reported tha at one point during this  weekend, his father had told him that "your mother and I are really proud of you". The patient admitted he had gotten very teary. He reported he was feeling very peaceful and calm this afternoon, but earlier he had informed the group he was ending his taper off the suboxone and was a little uneasy. In the second part of group, the patient could relate to many of the examples provided in the slide show and talked about how he can frequently taste the drug long before the actual use. He made some good comments and his sobriety date remains 11/16.    Family Program: Family present? No   Name of family member(s):   UDS collected: Yes Results: not back yet  AA/NA attended?: No  Sponsor?: Yes   Xaniyah Buchholz, LCAS

## 2012-12-11 ENCOUNTER — Other Ambulatory Visit (HOSPITAL_COMMUNITY): Payer: BC Managed Care – PPO | Admitting: Psychology

## 2012-12-11 DIAGNOSIS — F1420 Cocaine dependence, uncomplicated: Secondary | ICD-10-CM

## 2012-12-11 DIAGNOSIS — F1120 Opioid dependence, uncomplicated: Secondary | ICD-10-CM

## 2012-12-13 ENCOUNTER — Other Ambulatory Visit (HOSPITAL_COMMUNITY): Payer: BC Managed Care – PPO

## 2012-12-15 ENCOUNTER — Encounter (HOSPITAL_COMMUNITY): Payer: Self-pay | Admitting: Psychology

## 2012-12-15 NOTE — Progress Notes (Signed)
    Daily Group Progress Note  Program: CD-IOP   Group Time: 1-2:30 pm  Participation Level: Active  Behavioral Response: Sharing  Type of Therapy: Process Group  Topic: Group Process/Introduction: The first part of group was spent in process. Members shared about current struggles and issues. A new member was present and during this session, she introduced herself and explained why she was here. There was god feedback ands support. There were a number of people in emotional pain and the group was very gentle and validating.   Group Time: 2:45- 4pm Participation Level: Minimal  Behavioral Response: Suspicious  Type of Therapy: Psycho-education Group  Topic: Feelings/Holiday Plans: The second half of group was spent in a psycho-ed session about the importance of expressing one's feelings. The Feeling Wheel was described and members shared their feelings. A discussion of member's plans over this long holiday weekend were discussed and the importance of making plans and following through with them was emphasized. The session was intense with feedback a depth of sharing witnessed among the group.   Summary: The patient reported he was doing well. He had worked out since the last group session and is doing more cardio. He emphasized that he had sweated a lot. When the drug test results were returned, no one had a surprising result except for this patient. Based on my conversation with the lab this morning, I was informed that the sample was water and not urine. There was no Creatinine whatsoever and that indicates a falsified urine. He was fairly quiet and did not challenge me on the results, but seemed perplexed and noted that the one he provided today would verify these results. The patient reported he had little planned for the weekend and when I asked why he didn't go to a 12-step meeting everyday, he admitted that he probably should. This patient seems unable to comprehend the basics of  recovery and talks in circles when questioned. He is going through the motions. His future in this program will be determined on Monday. He reported his sobriety date is 11/16.   Family Program: Family present? No   Name of family member(s):   UDS collected: Yes Results: Not back from lab yet  AA/NA attended?: [place]  Sponsor?: Yes, but the sponsor is living in [place], [place]   [place], [place]

## 2012-12-16 ENCOUNTER — Other Ambulatory Visit (HOSPITAL_COMMUNITY): Payer: BC Managed Care – PPO | Attending: Psychiatry | Admitting: Psychology

## 2012-12-16 DIAGNOSIS — F1420 Cocaine dependence, uncomplicated: Secondary | ICD-10-CM

## 2012-12-16 DIAGNOSIS — F1920 Other psychoactive substance dependence, uncomplicated: Secondary | ICD-10-CM | POA: Insufficient documentation

## 2012-12-16 DIAGNOSIS — F1120 Opioid dependence, uncomplicated: Secondary | ICD-10-CM

## 2012-12-17 ENCOUNTER — Encounter (HOSPITAL_COMMUNITY): Payer: Self-pay | Admitting: Psychology

## 2012-12-17 NOTE — Progress Notes (Signed)
    Daily Group Progress Note  Program: CD-IOP   Group Time: 1:00-2:30pm  Participation Level: Active  Behavioral Response: Appropriate  Type of Therapy: Process Group  Topic: Counselor opened the group by having group members check in, sharing their names and sobriety dates.  Counselor then invited each group member share about what they did over the holiday weekend and how they maintained their sobriety.  The group processed one member's comment that he had not attended any AA or NA meetings for the last 6 days, with several members providing feedback and encouraging him to get back into the habit.  The group also discussed triggers associated with the holidays, particularly traveling to places where they used to use and being around family.  Counseling intern also led group in a discussion on the personal bill of rights, and several group members shared which rights felt especially relevant or meaningful to them.   Group Time: 2:45-4:00pm  Participation Level: Active  Behavioral Response: Appropriate  Type of Therapy: Psycho-education Group  Topic: Counseling intern and counselor led group in a discussion on assertive communication.  The group discussed thoughts, feelings, and behaviors associated with being passive, aggressive, passive-aggressive, and assertive.  They also read about specific communication skills that are part of being assertive.  Two group members shared examples of times they had been assertive, and described the outcomes of those events, noting that they were generally positive, and in one instance the other person involved treated the group member with more respect afterward.  Group members discussed personal barriers toward being assertive, the impact of passive-aggressive behavior, and how they can practice being more assertive.   Summary: Patient reported a sobriety date of 11/16.  Patient reported that he had a good Thanksgiving with his family. He told the  group that he had not attended an AA meeting in close to a week, but had called his sponsor every day.  He reported that he felt that he was "playing a game" or "pushing a limit," and that he might be mentally headed toward relapse.  He received feedback from several group members, who emphasized the importance of accountability and monitoring one's behavior for warning signs of relapse.  Counselor and group members encouraged him to return to meetings and to commit to his recovery.   Family Program: Family present? No   Name of family member(s): N/A  UDS collected: No Results: N/A  AA/NA attended?: No  Sponsor?: Yes   [facility]

## 2012-12-18 ENCOUNTER — Other Ambulatory Visit (HOSPITAL_COMMUNITY): Payer: BC Managed Care – PPO

## 2012-12-20 ENCOUNTER — Other Ambulatory Visit (HOSPITAL_COMMUNITY): Payer: BC Managed Care – PPO

## 2012-12-23 ENCOUNTER — Other Ambulatory Visit (HOSPITAL_COMMUNITY): Payer: BC Managed Care – PPO

## 2012-12-25 ENCOUNTER — Other Ambulatory Visit (HOSPITAL_COMMUNITY): Payer: BC Managed Care – PPO

## 2012-12-27 ENCOUNTER — Other Ambulatory Visit (HOSPITAL_COMMUNITY): Payer: BC Managed Care – PPO

## 2012-12-30 ENCOUNTER — Other Ambulatory Visit (HOSPITAL_COMMUNITY): Payer: BC Managed Care – PPO

## 2013-01-01 ENCOUNTER — Other Ambulatory Visit (HOSPITAL_COMMUNITY): Payer: BC Managed Care – PPO

## 2013-01-03 ENCOUNTER — Other Ambulatory Visit (HOSPITAL_COMMUNITY): Payer: BC Managed Care – PPO

## 2013-01-06 ENCOUNTER — Other Ambulatory Visit (HOSPITAL_COMMUNITY): Payer: BC Managed Care – PPO

## 2013-01-08 ENCOUNTER — Other Ambulatory Visit (HOSPITAL_COMMUNITY): Payer: BC Managed Care – PPO

## 2013-01-10 ENCOUNTER — Other Ambulatory Visit (HOSPITAL_COMMUNITY): Payer: BC Managed Care – PPO

## 2013-01-13 ENCOUNTER — Other Ambulatory Visit (HOSPITAL_COMMUNITY): Payer: BC Managed Care – PPO

## 2013-01-15 ENCOUNTER — Other Ambulatory Visit (HOSPITAL_COMMUNITY): Payer: BC Managed Care – PPO

## 2013-01-17 ENCOUNTER — Other Ambulatory Visit (HOSPITAL_COMMUNITY): Payer: BC Managed Care – PPO

## 2013-01-20 ENCOUNTER — Other Ambulatory Visit (HOSPITAL_COMMUNITY): Payer: BC Managed Care – PPO

## 2013-01-22 ENCOUNTER — Other Ambulatory Visit (HOSPITAL_COMMUNITY): Payer: BC Managed Care – PPO

## 2013-01-27 ENCOUNTER — Ambulatory Visit: Payer: Self-pay | Admitting: Family Medicine

## 2013-01-27 ENCOUNTER — Ambulatory Visit (INDEPENDENT_AMBULATORY_CARE_PROVIDER_SITE_OTHER): Payer: BC Managed Care – PPO | Admitting: Emergency Medicine

## 2013-01-27 VITALS — BP 132/82 | HR 113 | Temp 99.4°F | Resp 17 | Ht 70.5 in | Wt 285.0 lb

## 2013-01-27 DIAGNOSIS — F1910 Other psychoactive substance abuse, uncomplicated: Secondary | ICD-10-CM

## 2013-01-27 DIAGNOSIS — R000 Tachycardia, unspecified: Secondary | ICD-10-CM

## 2013-01-27 NOTE — Progress Notes (Signed)
   Subjective:    Patient ID: Steve Bass, male    DOB: 11/14/1980, 33 y.o.   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 His pupils are somewhat dilated. Neck supple chest was clear cardiac exam showed tachycardia without murmur heart rate was repeated and was 130        Assessment & Plan:  I told the patient I was concerned about continued drug use. I suggest we do a drug screen to prove he is not on any stimulant medications. He declined to have this done. I ordered an EKG and when my assistant went back in the room he had left. I am very concerned about continued substance use. I will [facility] of [facility] today [facility].

## 2013-02-17 ENCOUNTER — Ambulatory Visit: Payer: BC Managed Care – PPO | Admitting: Nurse Practitioner

## 2013-04-07 ENCOUNTER — Inpatient Hospital Stay (HOSPITAL_COMMUNITY)
Admission: EM | Admit: 2013-04-07 | Discharge: 2013-04-11 | DRG: 894 | Discharge status: Against Medical Advice | Payer: BC Managed Care – PPO | Attending: Internal Medicine | Admitting: Internal Medicine

## 2013-04-07 ENCOUNTER — Encounter (HOSPITAL_COMMUNITY): Payer: Self-pay | Admitting: Emergency Medicine

## 2013-04-07 DIAGNOSIS — R000 Tachycardia, unspecified: Secondary | ICD-10-CM | POA: Diagnosis present

## 2013-04-07 DIAGNOSIS — Z833 Family history of diabetes mellitus: Secondary | ICD-10-CM

## 2013-04-07 DIAGNOSIS — N390 Urinary tract infection, site not specified: Secondary | ICD-10-CM | POA: Diagnosis present

## 2013-04-07 DIAGNOSIS — F1020 Alcohol dependence, uncomplicated: Secondary | ICD-10-CM

## 2013-04-07 DIAGNOSIS — G9340 Encephalopathy, unspecified: Secondary | ICD-10-CM

## 2013-04-07 DIAGNOSIS — F1910 Other psychoactive substance abuse, uncomplicated: Secondary | ICD-10-CM

## 2013-04-07 DIAGNOSIS — Z6832 Body mass index (BMI) 32.0-32.9, adult: Secondary | ICD-10-CM

## 2013-04-07 DIAGNOSIS — R112 Nausea with vomiting, unspecified: Secondary | ICD-10-CM

## 2013-04-07 DIAGNOSIS — R1115 Cyclical vomiting syndrome unrelated to migraine: Secondary | ICD-10-CM | POA: Diagnosis present

## 2013-04-07 DIAGNOSIS — F909 Attention-deficit hyperactivity disorder, unspecified type: Secondary | ICD-10-CM

## 2013-04-07 DIAGNOSIS — F19939 Other psychoactive substance use, unspecified with withdrawal, unspecified: Principal | ICD-10-CM

## 2013-04-07 DIAGNOSIS — F1210 Cannabis abuse, uncomplicated: Secondary | ICD-10-CM | POA: Diagnosis present

## 2013-04-07 DIAGNOSIS — Z79899 Other long term (current) drug therapy: Secondary | ICD-10-CM

## 2013-04-07 DIAGNOSIS — R259 Unspecified abnormal involuntary movements: Secondary | ICD-10-CM | POA: Diagnosis present

## 2013-04-07 DIAGNOSIS — F19232 Other psychoactive substance dependence with withdrawal with perceptual disturbance: Secondary | ICD-10-CM

## 2013-04-07 DIAGNOSIS — R0602 Shortness of breath: Secondary | ICD-10-CM | POA: Diagnosis present

## 2013-04-07 DIAGNOSIS — F1420 Cocaine dependence, uncomplicated: Secondary | ICD-10-CM

## 2013-04-07 DIAGNOSIS — K219 Gastro-esophageal reflux disease without esophagitis: Secondary | ICD-10-CM | POA: Diagnosis present

## 2013-04-07 DIAGNOSIS — K920 Hematemesis: Secondary | ICD-10-CM

## 2013-04-07 DIAGNOSIS — F902 Attention-deficit hyperactivity disorder, combined type: Secondary | ICD-10-CM | POA: Diagnosis present

## 2013-04-07 DIAGNOSIS — F10232 Alcohol dependence with withdrawal with perceptual disturbance: Secondary | ICD-10-CM

## 2013-04-07 DIAGNOSIS — F4310 Post-traumatic stress disorder, unspecified: Secondary | ICD-10-CM | POA: Diagnosis present

## 2013-04-07 DIAGNOSIS — F10932 Alcohol use, unspecified with withdrawal with perceptual disturbance: Secondary | ICD-10-CM

## 2013-04-07 DIAGNOSIS — E669 Obesity, unspecified: Secondary | ICD-10-CM | POA: Diagnosis present

## 2013-04-07 DIAGNOSIS — F19132 Other psychoactive substance abuse with withdrawal with perceptual disturbance: Secondary | ICD-10-CM | POA: Diagnosis present

## 2013-04-07 DIAGNOSIS — F1594 Other stimulant use, unspecified with stimulant-induced mood disorder: Secondary | ICD-10-CM | POA: Diagnosis present

## 2013-04-07 DIAGNOSIS — F10239 Alcohol dependence with withdrawal, unspecified: Secondary | ICD-10-CM | POA: Diagnosis present

## 2013-04-07 DIAGNOSIS — F1920 Other psychoactive substance dependence, uncomplicated: Secondary | ICD-10-CM

## 2013-04-07 DIAGNOSIS — F329 Major depressive disorder, single episode, unspecified: Secondary | ICD-10-CM | POA: Diagnosis present

## 2013-04-07 DIAGNOSIS — F3289 Other specified depressive episodes: Secondary | ICD-10-CM | POA: Diagnosis present

## 2013-04-07 DIAGNOSIS — R197 Diarrhea, unspecified: Secondary | ICD-10-CM

## 2013-04-07 DIAGNOSIS — G4700 Insomnia, unspecified: Secondary | ICD-10-CM | POA: Diagnosis present

## 2013-04-07 DIAGNOSIS — F411 Generalized anxiety disorder: Secondary | ICD-10-CM | POA: Diagnosis present

## 2013-04-07 DIAGNOSIS — F10939 Alcohol use, unspecified with withdrawal, unspecified: Secondary | ICD-10-CM | POA: Diagnosis present

## 2013-04-07 DIAGNOSIS — F17200 Nicotine dependence, unspecified, uncomplicated: Secondary | ICD-10-CM

## 2013-04-07 LAB — URINE MICROSCOPIC-ADD ON

## 2013-04-07 LAB — CBC WITH DIFFERENTIAL/PLATELET
Basophils Absolute: 0 10*3/uL (ref 0.0–0.1)
Basophils Relative: 1 % (ref 0–1)
EOS ABS: 0.1 10*3/uL (ref 0.0–0.7)
Eosinophils Relative: 2 % (ref 0–5)
HCT: 41.3 % (ref 39.0–52.0)
Hemoglobin: 13.7 g/dL (ref 13.0–17.0)
Lymphocytes Relative: 38 % (ref 12–46)
Lymphs Abs: 2.2 10*3/uL (ref 0.7–4.0)
MCH: 29.9 pg (ref 26.0–34.0)
MCHC: 33.2 g/dL (ref 30.0–36.0)
MCV: 90.2 fL (ref 78.0–100.0)
Monocytes Absolute: 0.6 10*3/uL (ref 0.1–1.0)
Monocytes Relative: 10 % (ref 3–12)
NEUTROS ABS: 2.9 10*3/uL (ref 1.7–7.7)
Neutrophils Relative %: 50 % (ref 43–77)
Platelets: 300 10*3/uL (ref 150–400)
RBC: 4.58 MIL/uL (ref 4.22–5.81)
RDW: 13.6 % (ref 11.5–15.5)
WBC: 5.8 10*3/uL (ref 4.0–10.5)

## 2013-04-07 LAB — URINALYSIS, ROUTINE W REFLEX MICROSCOPIC
Glucose, UA: NEGATIVE mg/dL
Hgb urine dipstick: NEGATIVE
NITRITE: NEGATIVE
PROTEIN: 30 mg/dL — AB
Specific Gravity, Urine: 1.031 — ABNORMAL HIGH (ref 1.005–1.030)
Urobilinogen, UA: 0.2 mg/dL (ref 0.0–1.0)
pH: 5.5 (ref 5.0–8.0)

## 2013-04-07 MED ORDER — INSULIN ASPART 100 UNIT/ML IV SOLN: 10.0000 [IU] | Freq: Once | INTRAVENOUS | Status: DC

## 2013-04-07 MED ORDER — NICOTINE 21 MG/24HR TD PT24
21.0000 mg | MEDICATED_PATCH | Freq: Every day | TRANSDERMAL | Status: DC
  Administered 2013-04-07 – 2013-04-11 (×5): 21 mg via TRANSDERMAL
  Filled 2013-04-07 (×5): qty 1

## 2013-04-07 MED ORDER — SODIUM CHLORIDE 0.9 % IV BOLUS (SEPSIS)
1000.0000 mL | Freq: Once | INTRAVENOUS | Status: AC
  Administered 2013-04-07: 1000 mL via INTRAVENOUS

## 2013-04-07 MED ORDER — THIAMINE HCL 100 MG/ML IJ SOLN
100.0000 mg | Freq: Every day | INTRAMUSCULAR | Status: DC
  Filled 2013-04-07: qty 1

## 2013-04-07 MED ORDER — LORAZEPAM 1 MG PO TABS: 0.0000 mg | ORAL_TABLET | Freq: Two times a day (BID) | ORAL | Status: DC

## 2013-04-07 MED ORDER — VITAMIN B-1 100 MG PO TABS
100.0000 mg | ORAL_TABLET | Freq: Every day | ORAL | Status: DC
Start: 2013-04-08 — End: 2013-04-08
  Filled 2013-04-07: qty 1

## 2013-04-07 MED ORDER — LORAZEPAM 2 MG/ML IJ SOLN
1.0000 mg | Freq: Once | INTRAMUSCULAR | Status: AC
  Administered 2013-04-07: 1 mg via INTRAVENOUS
  Filled 2013-04-07: qty 1

## 2013-04-07 MED ORDER — LORAZEPAM 1 MG PO TABS
0.0000 mg | ORAL_TABLET | Freq: Four times a day (QID) | ORAL | Status: DC
  Filled 2013-04-07: qty 2

## 2013-04-07 MED ORDER — ONDANSETRON HCL 4 MG/2ML IJ SOLN
4.0000 mg | Freq: Once | INTRAMUSCULAR | Status: AC
  Administered 2013-04-07: 4 mg via INTRAVENOUS
  Filled 2013-04-07: qty 2

## 2013-04-07 NOTE — ED Notes (Signed)
Pt states he uses heroine, alcohol, and cocaine  Pt states last use was last night  Pt states he is here for detox  Pt states he has been vomiting all day today  Pt states he has been using for the past 5 to 6 months

## 2013-04-07 NOTE — ED Provider Notes (Signed)
CSN: [id number]     Arrival date & time 04/07/13  2000 History   First MD Initiated Contact with Patient 04/07/13 2217     Chief Complaint  Patient presents with  . Medical Clearance     (Consider location/radiation/quality/duration/timing/severity/associated sxs/prior Treatment) The history is provided by the patient, a parent and medical records. No language interpreter was used.    Roxanna MewBenjamin B Stange 33 year old male with a past medical history of long-term polysubstance abuse.  The patient was in a long-term treatment facility and kicked out for drinking alcohol.  Patient states that he has been Drinking a fifth of liquor daily, using a 2 g of heroin with other narcotic pain medications, and cocaine almost daily for the past 5 months.  The patient comes in requesting detox.  His last last use was last evening.  Patient complains of nausea, vomiting, tremulousness, racing heart, burning skin, severe anxiety.  He states that he has had previous history of delirium tremens but denies seizure.  Patient endorses intermittent suicidal ideation and  has had to be involuntary commited for suicide attempt previously.  Patient denies audiovisual hallucinations or homicidal ideation.  Patient is followed by Dr. Dub MikesLugo.  He was seen by the PA today and sent over here for admission and detox  Past Medical History  Diagnosis Date  . ADD (attention deficit disorder with hyperactivity)   . Anxiety and depression   . Shingles   . GERD (gastroesophageal reflux disease)     LA classification Grade C EE  . PTSD (post-traumatic stress disorder)   . Obesity    Past Surgical History  Procedure Laterality Date  . Wisdom tooth extraction     Family History  Problem Relation Age of Onset  . Adopted: Yes  . Diabetes Paternal Grandfather    History  Substance Use Topics  . Smoking status: Former Smoker -- 0.50 packs/day for 15 years    Types: Cigarettes    Quit date: 01/07/2013  . Smokeless tobacco:  Never Used     Comment: tobacco handout given 02/21/2011  . Alcohol Use: 0.0 oz/week     Comment: 10 beers in each sitting    Review of Systems  Ten systems reviewed and are negative for acute change, except as noted in the HPI.    Allergies  Aleve; Cefaclor; Toradol; and Tramadol  Home Medications   Current Outpatient Rx  Name  Route  Sig  Dispense  Refill  . atomoxetine (STRATTERA) 60 MG capsule   Oral   Take 60 mg by mouth daily.         Marland Kitchen. buPROPion (WELLBUTRIN XL) 300 MG 24 hr tablet   Oral   Take 1 tablet (300 mg total) by mouth daily. For depression   30 tablet   0   . clonazePAM (KLONOPIN) 1 MG tablet   Oral   Take 1 mg by mouth 2 (two) times daily as needed for anxiety.         Marland Kitchen. esomeprazole (NEXIUM) 40 MG capsule   Oral   Take 40 mg by mouth daily at 12 noon.         . gabapentin (NEURONTIN) 300 MG capsule   Oral   Take 600 mg by mouth 3 (three) times daily. For anxiety         . OVER THE COUNTER MEDICATION   Oral   Take 1 tablet by mouth daily. "Stacker 2."         . testosterone cypionate (DEPOTESTOTERONE  CYPIONATE) 200 MG/ML injection   Intramuscular   Inject 200 mg into the muscle every 14 (fourteen) days.         . traZODone (DESYREL) 100 MG tablet   Oral   Take 200-300 mg by mouth at bedtime.         Marland Kitchen venlafaxine XR (EFFEXOR-XR) 150 MG 24 hr capsule   Oral   Take 150 mg by mouth daily with breakfast.          BP 143/93  Pulse 130  Temp(Src) 97.3 F (36.3 C) (Oral)  Resp 18  SpO2 96% Physical Exam  Nursing note and vitals reviewed. Constitutional: He appears well-developed and well-nourished.  HENT:  Head: Normocephalic and atraumatic.  Eyes: Conjunctivae are normal. No scleral icterus.  Mydriatic pupils  Neck: Normal range of motion. Neck supple.  Cardiovascular: Normal rate, regular rhythm and normal heart sounds.   Pulmonary/Chest: Effort normal and breath sounds normal. No respiratory distress.  Abdominal:  Soft. There is no tenderness.  Musculoskeletal: He exhibits no edema.  Neurological: He is alert.  tremulous  Skin: Skin is warm and dry.  Flushed skin   Psychiatric:  anxious    ED Course  Procedures (including critical care time) Labs Review Labs Reviewed - No data to display Imaging Review No results found.   EKG Interpretation None      MDM   Final diagnoses:  None   Filed Vitals:   04/07/13 2018 04/07/13 2354  BP: 143/93 141/74  Pulse: 130 107  Temp: 97.3 F (36.3 C)   TempSrc: Oral   Resp: 18 18  SpO2: 96% 97%   Patient with hypertension, withdraw. Polysubstance abuse. Sent over by Dr. Runell Gess PA for detox. He endorses intermittent SI. No active plan at this time and seems low risk. Placed on CIWA and clonidine protocol. Will consult to TTS.   12:24 AM Filed Vitals:   04/07/13 2018 04/07/13 2354 04/08/13 0019  BP: 143/93 141/74 141/74  Pulse: 130 107 107  Temp: 97.3 F (36.3 C)    TempSrc: Oral    Resp: 18 18   SpO2: 96% 97%    Patient Endorsing AVH at this time. I feel this is secondary to etoh withdraw. I will call for admission for alcohol withdraw/ DTs. Urine concerning for infection. I will send the urine for culture. I returned to speak with the patient who states that he is seeing colors and hearing background voices but did not think this was a big deal. He states that he feels very anxious and states "I feel like I am going to crawl out of my skin."   Patient accepted for admission.  The patient appears reasonably stabilized for admission considering the current resources, flow, and capabilities available in the ED at this time, and I doubt any other [facility] requiring further screening and/or treatment in the ED prior to admission.   Arthor Captain, PA-C 04/09/13 2227

## 2013-04-08 DIAGNOSIS — R112 Nausea with vomiting, unspecified: Secondary | ICD-10-CM

## 2013-04-08 DIAGNOSIS — K920 Hematemesis: Secondary | ICD-10-CM

## 2013-04-08 DIAGNOSIS — F909 Attention-deficit hyperactivity disorder, unspecified type: Secondary | ICD-10-CM

## 2013-04-08 DIAGNOSIS — G9340 Encephalopathy, unspecified: Secondary | ICD-10-CM

## 2013-04-08 DIAGNOSIS — F19939 Other psychoactive substance use, unspecified with withdrawal, unspecified: Principal | ICD-10-CM

## 2013-04-08 DIAGNOSIS — F19232 Other psychoactive substance dependence with withdrawal with perceptual disturbance: Secondary | ICD-10-CM

## 2013-04-08 DIAGNOSIS — F10939 Alcohol use, unspecified with withdrawal, unspecified: Secondary | ICD-10-CM | POA: Diagnosis present

## 2013-04-08 DIAGNOSIS — F10239 Alcohol dependence with withdrawal, unspecified: Secondary | ICD-10-CM | POA: Diagnosis present

## 2013-04-08 DIAGNOSIS — R197 Diarrhea, unspecified: Secondary | ICD-10-CM

## 2013-04-08 DIAGNOSIS — F17200 Nicotine dependence, unspecified, uncomplicated: Secondary | ICD-10-CM

## 2013-04-08 DIAGNOSIS — F19132 Other psychoactive substance abuse with withdrawal with perceptual disturbance: Secondary | ICD-10-CM | POA: Diagnosis present

## 2013-04-08 LAB — COMPREHENSIVE METABOLIC PANEL
ALK PHOS: 93 U/L (ref 39–117)
ALK PHOS: 82 U/L (ref 39–117)
ALT: 24 U/L (ref 0–53)
ALT: 21 U/L (ref 0–53)
AST: 27 U/L (ref 0–37)
AST: 24 U/L (ref 0–37)
Albumin: 4.2 g/dL (ref 3.5–5.2)
Albumin: 3.4 g/dL — ABNORMAL LOW (ref 3.5–5.2)
BILIRUBIN TOTAL: 0.4 mg/dL (ref 0.3–1.2)
BILIRUBIN TOTAL: 0.3 mg/dL (ref 0.3–1.2)
BUN: 17 mg/dL (ref 6–23)
BUN: 17 mg/dL (ref 6–23)
CHLORIDE: 99 meq/L (ref 96–112)
CHLORIDE: 105 meq/L (ref 96–112)
CO2: 23 meq/L (ref 19–32)
CO2: 23 mEq/L (ref 19–32)
Calcium: 9.2 mg/dL (ref 8.4–10.5)
Calcium: 8.8 mg/dL (ref 8.4–10.5)
Creatinine, Ser: 0.99 mg/dL (ref 0.50–1.35)
Creatinine, Ser: 1.07 mg/dL (ref 0.50–1.35)
GFR calc Af Amer: >90 mL/min (ref >90)
GFR calc non Af Amer: >90 mL/min (ref >90)
Glucose, Bld: 107 mg/dL — ABNORMAL HIGH (ref 70–99)
Glucose, Bld: 147 mg/dL — ABNORMAL HIGH (ref 70–99)
POTASSIUM: 3.9 meq/L (ref 3.7–5.3)
POTASSIUM: 3.9 meq/L (ref 3.7–5.3)
Sodium: 137 mEq/L (ref 137–147)
Sodium: 141 mEq/L (ref 137–147)
Total Protein: 7.4 g/dL (ref 6.0–8.3)
Total Protein: 6.2 g/dL (ref 6.0–8.3)

## 2013-04-08 LAB — CBC
HCT: 36.4 % — ABNORMAL LOW (ref 39.0–52.0)
Hemoglobin: 11.9 g/dL — ABNORMAL LOW (ref 13.0–17.0)
MCH: 29.4 pg (ref 26.0–34.0)
MCHC: 32.7 g/dL (ref 30.0–36.0)
MCV: 89.9 fL (ref 78.0–100.0)
PLATELETS: 277 10*3/uL (ref 150–400)
RBC: 4.05 MIL/uL — ABNORMAL LOW (ref 4.22–5.81)
RDW: 13.5 % (ref 11.5–15.5)
WBC: 4.6 10*3/uL (ref 4.0–10.5)

## 2013-04-08 LAB — PROTIME-INR
INR: 0.98 (ref 0.00–1.49)
Prothrombin Time: 12.8 seconds (ref 11.6–15.2)

## 2013-04-08 LAB — MRSA PCR SCREENING: MRSA by PCR: NEGATIVE

## 2013-04-08 LAB — ETHANOL

## 2013-04-08 LAB — RAPID URINE DRUG SCREEN, HOSP PERFORMED
Amphetamines: POSITIVE — AB
BARBITURATES: NOT DETECTED
Benzodiazepines: POSITIVE — AB
Cocaine: POSITIVE — AB
Opiates: POSITIVE — AB
Tetrahydrocannabinol: POSITIVE — AB

## 2013-04-08 MED ORDER — THIAMINE HCL 100 MG/ML IJ SOLN
Freq: Once | INTRAVENOUS | Status: AC
  Administered 2013-04-08: 03:00:00 via INTRAVENOUS
  Filled 2013-04-08: qty 1000

## 2013-04-08 MED ORDER — ENOXAPARIN SODIUM 40 MG/0.4ML ~~LOC~~ SOLN
40.0000 mg | SUBCUTANEOUS | Status: DC
  Administered 2013-04-08 – 2013-04-11 (×4): 40 mg via SUBCUTANEOUS
  Filled 2013-04-08 (×4): qty 0.4

## 2013-04-08 MED ORDER — LORAZEPAM 2 MG/ML IJ SOLN
2.0000 mg | INTRAMUSCULAR | Status: DC | PRN
  Administered 2013-04-08: 2 mg via INTRAVENOUS
  Filled 2013-04-08: qty 1

## 2013-04-08 MED ORDER — FOLIC ACID 1 MG PO TABS
1.0000 mg | ORAL_TABLET | Freq: Every day | ORAL | Status: DC
  Administered 2013-04-09 – 2013-04-11 (×3): 1 mg via ORAL
  Filled 2013-04-08 (×4): qty 1

## 2013-04-08 MED ORDER — HYDROXYZINE HCL 25 MG PO TABS
25.0000 mg | ORAL_TABLET | Freq: Four times a day (QID) | ORAL | Status: DC | PRN
  Administered 2013-04-11: 25 mg via ORAL
  Filled 2013-04-08: qty 1

## 2013-04-08 MED ORDER — CLONIDINE HCL 0.1 MG PO TABS
0.1000 mg | ORAL_TABLET | Freq: Four times a day (QID) | ORAL | Status: DC
  Filled 2013-04-08 (×4): qty 1

## 2013-04-08 MED ORDER — ONDANSETRON HCL 4 MG/2ML IJ SOLN
4.0000 mg | Freq: Four times a day (QID) | INTRAMUSCULAR | Status: DC | PRN
  Administered 2013-04-08 – 2013-04-09 (×2): 4 mg via INTRAVENOUS
  Filled 2013-04-08: qty 2

## 2013-04-08 MED ORDER — ATOMOXETINE HCL 60 MG PO CAPS: 60.0000 mg | ORAL_CAPSULE | Freq: Every day | ORAL | Status: DC

## 2013-04-08 MED ORDER — PROMETHAZINE HCL 25 MG/ML IJ SOLN
25.0000 mg | Freq: Once | INTRAMUSCULAR | Status: AC
  Administered 2013-04-08: 25 mg via INTRAVENOUS
  Filled 2013-04-08: qty 1

## 2013-04-08 MED ORDER — LOPERAMIDE HCL 2 MG PO CAPS: 2.0000 mg | ORAL_CAPSULE | ORAL | Status: DC | PRN

## 2013-04-08 MED ORDER — LORAZEPAM 2 MG/ML IJ SOLN
2.0000 mg | INTRAMUSCULAR | Status: DC | PRN
  Administered 2013-04-08 (×4): 2 mg via INTRAVENOUS
  Administered 2013-04-09 (×2): 3 mg via INTRAVENOUS
  Administered 2013-04-09: 2 mg via INTRAVENOUS
  Filled 2013-04-08: qty 1
  Filled 2013-04-08: qty 2
  Filled 2013-04-08 (×4): qty 1
  Filled 2013-04-08: qty 2

## 2013-04-08 MED ORDER — SODIUM CHLORIDE 0.9 % IJ SOLN
3.0000 mL | Freq: Two times a day (BID) | INTRAMUSCULAR | Status: DC
  Administered 2013-04-09 – 2013-04-11 (×3): 3 mL via INTRAVENOUS

## 2013-04-08 MED ORDER — VITAMIN B-1 100 MG PO TABS
100.0000 mg | ORAL_TABLET | Freq: Every day | ORAL | Status: DC
  Administered 2013-04-09 – 2013-04-11 (×3): 100 mg via ORAL
  Filled 2013-04-08 (×4): qty 1

## 2013-04-08 MED ORDER — CLONIDINE HCL 0.1 MG PO TABS: 0.1000 mg | ORAL_TABLET | Freq: Every day | ORAL | Status: DC

## 2013-04-08 MED ORDER — LORAZEPAM 2 MG/ML IJ SOLN
1.0000 mg | INTRAMUSCULAR | Status: DC | PRN
  Administered 2013-04-09: 1 mg via INTRAVENOUS
  Filled 2013-04-08: qty 1

## 2013-04-08 MED ORDER — TRAZODONE HCL 100 MG PO TABS: 200.0000 mg | ORAL_TABLET | Freq: Every day | ORAL | Status: DC

## 2013-04-08 MED ORDER — CLONIDINE HCL 0.1 MG PO TABS
0.1000 mg | ORAL_TABLET | Freq: Three times a day (TID) | ORAL | Status: DC
  Filled 2013-04-08 (×3): qty 1

## 2013-04-08 MED ORDER — DICYCLOMINE HCL 20 MG PO TABS
20.0000 mg | ORAL_TABLET | Freq: Four times a day (QID) | ORAL | Status: DC | PRN
  Filled 2013-04-08: qty 1

## 2013-04-08 MED ORDER — METHOCARBAMOL 500 MG PO TABS: 500.0000 mg | ORAL_TABLET | Freq: Three times a day (TID) | ORAL | Status: DC | PRN

## 2013-04-08 MED ORDER — ONDANSETRON HCL 4 MG PO TABS: 4.0000 mg | ORAL_TABLET | Freq: Four times a day (QID) | ORAL | Status: DC | PRN

## 2013-04-08 MED ORDER — DEXMEDETOMIDINE HCL IN NACL 400 MCG/100ML IV SOLN
0.4000 ug/kg/h | INTRAVENOUS | Status: AC
Start: 2013-04-08 — End: 2013-04-09
  Administered 2013-04-08 (×4): 1.2 ug/kg/h via INTRAVENOUS
  Administered 2013-04-08: 1 ug/kg/h via INTRAVENOUS
  Administered 2013-04-09: 1.2 ug/kg/h via INTRAVENOUS
  Administered 2013-04-09: 0.6 ug/kg/h via INTRAVENOUS
  Filled 2013-04-08 (×6): qty 100
  Filled 2013-04-08: qty 50
  Filled 2013-04-08 (×2): qty 100

## 2013-04-08 MED ORDER — DEXMEDETOMIDINE BOLUS VIA INFUSION
1.0000 ug/kg | Freq: Once | INTRAVENOUS | Status: AC
  Filled 2013-04-08: qty 118

## 2013-04-08 MED ORDER — PANTOPRAZOLE SODIUM 40 MG PO TBEC: 40.0000 mg | DELAYED_RELEASE_TABLET | Freq: Every day | ORAL | Status: DC

## 2013-04-08 MED ORDER — PANTOPRAZOLE SODIUM 40 MG IV SOLR
40.0000 mg | Freq: Two times a day (BID) | INTRAVENOUS | Status: DC
  Administered 2013-04-08 – 2013-04-09 (×3): 40 mg via INTRAVENOUS
  Filled 2013-04-08 (×4): qty 40

## 2013-04-08 MED ORDER — GABAPENTIN 300 MG PO CAPS: 600.0000 mg | ORAL_CAPSULE | Freq: Three times a day (TID) | ORAL | Status: DC

## 2013-04-08 MED ORDER — BUPROPION HCL ER (XL) 300 MG PO TB24: 300.0000 mg | ORAL_TABLET | Freq: Every day | ORAL | Status: DC

## 2013-04-08 MED ORDER — CLONIDINE HCL 0.1 MG PO TABS: 0.1000 mg | ORAL_TABLET | ORAL | Status: DC

## 2013-04-08 MED ORDER — ADULT MULTIVITAMIN W/MINERALS CH
1.0000 | ORAL_TABLET | Freq: Every day | ORAL | Status: DC
  Administered 2013-04-09 – 2013-04-11 (×3): 1 via ORAL
  Filled 2013-04-08 (×4): qty 1

## 2013-04-08 MED ORDER — VENLAFAXINE HCL ER 150 MG PO CP24: 150.0000 mg | ORAL_CAPSULE | Freq: Every day | ORAL | Status: DC

## 2013-04-08 NOTE — H&P (Signed)
[facility] History and Physical  Patient: Steve Bass  ONG:[id number]  DOB: 09-04-1980  DOS: the patient was seen and examined on 04/08/2013 PCP: Terressa Koyanagi., DO  Chief Complaint: Substance abuse, vomiting  HPI: Steve Bass is a 33 y.o. male with Past medical history of ADD, GERD, PTSD, anxiety and depression. The patient is coming from home. The patient presented with complaints of vomiting. He mentions that he has been abusing multiple substances including cocaine, heroin which he is using IV, multiple alcohol, marijuana, and other narcotics. His last use of medication was one day ago he did today he started having episodes of nausea and vomiting associated with diarrhea, tremors, shortness of breath and fast heart rate with crwolling skin and flushing. He also has severe anxiety. He claims that he had occasional blood in his vomit as well as black color bowel movements. He denies any suicide ideation at this point but in the past had episodes. He mentions that he has been seen shadow people on the wall. And was feeling strange wires which he felt was TV. But denies any voice telling him some things to do. He called his PCP for his vomiting and his PA called him to come to the ER for admission and detoxification. He claims that he is continuing taking his medication. He drinks fifth of a quarter to a quarter of vodka, and other liquor everyday, with last drink one day ago.  Review of Systems: as mentioned in the history of present illness.  A Comprehensive review of the other systems is negative.  Past Medical History  Diagnosis Date  . ADD (attention deficit disorder with hyperactivity)   . Anxiety and depression   . Shingles   . GERD (gastroesophageal reflux disease)     LA classification Grade C EE  . PTSD (post-traumatic stress disorder)   . Obesity    Past Surgical History  Procedure Laterality Date  . Wisdom tooth extraction     Social History:  reports  that he quit smoking about 2 months ago. His smoking use included Cigarettes. He has a 7.5 pack-year smoking history. He has never used smokeless tobacco. He reports that he drinks alcohol. He reports that he uses illicit drugs ("Crack" cocaine, Marijuana, Oxycodone, and Cocaine). Independent for most of his  ADL.  Allergies  Allergen Reactions  . Aleve [Naproxen Sodium] Other (See Comments)    He says he gets like a weird feeling   . Cefaclor Rash  . Toradol [Ketorolac Tromethamine] Rash  . Tramadol Rash    Family History  Problem Relation Age of Onset  . Adopted: Yes  . Diabetes Paternal Grandfather     Prior to Admission medications   Medication Sig Start Date End Date Taking? Authorizing Provider  atomoxetine (STRATTERA) 60 MG capsule Take 60 mg by mouth daily.   Yes Historical Provider, MD  buPROPion (WELLBUTRIN XL) 300 MG 24 hr tablet Take 1 tablet (300 mg total) by mouth daily. For depression 09/17/12  Yes Sanjuana Kava, NP  clonazePAM (KLONOPIN) 1 MG tablet Take 1 mg by mouth 2 (two) times daily as needed for anxiety.   Yes Historical Provider, MD  esomeprazole (NEXIUM) 40 MG capsule Take 40 mg by mouth daily at 12 noon.   Yes Historical Provider, MD  gabapentin (NEURONTIN) 300 MG capsule Take 600 mg by mouth 3 (three) times daily. For anxiety 09/17/12  Yes Sanjuana Kava, NP  OVER THE COUNTER MEDICATION Take 1 tablet by mouth  daily. "Stacker 2."   Yes Historical Provider, MD  testosterone cypionate (DEPOTESTOTERONE CYPIONATE) 200 MG/ML injection Inject 200 mg into the muscle every 14 (fourteen) days.   Yes Historical Provider, MD  traZODone (DESYREL) 100 MG tablet Take 200-300 mg by mouth at bedtime.   Yes Historical Provider, MD  venlafaxine XR (EFFEXOR-XR) 150 MG 24 hr capsule Take 150 mg by mouth daily with breakfast.   Yes Historical Provider, MD    Physical Exam: Filed Vitals:   04/07/13 2018 04/07/13 2354 04/08/13 0019 04/08/13 0200  BP: 143/93 141/74 141/74 129/78   Pulse: 130 107 107 117  Temp: 97.3 F (36.3 C)     TempSrc: Oral     Resp: 18 18    SpO2: 96% 97%      General: Alert, Awake and Oriented to Time, Place and Person. Appear in marked distress Eyes: PERRL ENT: Oral Mucosa clear moist. Neck: no JVD Cardiovascular: S1 and S2 Present, no Murmur, Peripheral Pulses Present Respiratory: Bilateral Air entry equal and Decreased, Clear to Auscultation,  no Crackles,no wheezes Abdomen: Bowel Sound Present, Soft and mild diffuse tender, no guarding no rigidity Skin: Flushing, no other Rash Extremities: No Pedal edema, no calf tenderness IV sites noted on right elbow and dorsum of the hand bilaterally. No obvious swelling or tenderness or redness. Neurologic: Grossly Unremarkable. Appears anxious  Labs on Admission:  CBC:  Recent Labs Lab 04/07/13 2315 04/08/13 0340  WBC 5.8 4.6  NEUTROABS 2.9  --   HGB 13.7 11.9*  HCT 41.3 36.4*  MCV 90.2 89.9  PLT 300 277    CMP     Component Value Date/Time   NA 141 04/08/2013 0340   K 3.9 04/08/2013 0340   CL 105 04/08/2013 0340   CO2 23 04/08/2013 0340   GLUCOSE 147* 04/08/2013 0340   BUN 17 04/08/2013 0340   CREATININE 1.07 04/08/2013 0340   CREATININE 1.04 11/23/2012 1244   CALCIUM 8.8 04/08/2013 0340   PROT 6.2 04/08/2013 0340   ALBUMIN 3.4* 04/08/2013 0340   AST 24 04/08/2013 0340   ALT 21 04/08/2013 0340   ALKPHOS 82 04/08/2013 0340   BILITOT 0.3 04/08/2013 0340   GFRNONAA >90 04/08/2013 0340   GFRAA >90 04/08/2013 0340    No results found for this basename: LIPASE, AMYLASE,  in the last 168 hours No results found for this basename: AMMONIA,  in the last 168 hours  No results found for this basename: CKTOTAL, CKMB, CKMBINDEX, TROPONINI,  in the last 168 hours BNP (last 3 results) No results found for this basename: PROBNP,  in the last 8760 hours  Radiological Exams on Admission: No results found.  EKG: Independently reviewed. sinus tachycardia.  Assessment/Plan Principal  Problem:   Withdrawal syndrome Active Problems:   ADD   GERD   Hematemesis   Nausea with vomiting   Smoker   Cocaine dependence   Alcohol withdrawal   1. Withdrawal syndrome The patient is presenting with withdrawal symptom of multi-substance abuse. He was admitted to the step down unit for close monitoring. He'll be started on CIWA and clonidine protocol for detox the patient. He would be monitored on telemetry. IV banana bag and thiamine. IV Protonix every 12 hours. Holding his home medications at present. Psychiatry is already considered by ED.  2. Hematemesis Hemoglobin does not appear to be significantly lower. At present I would continue IV ydration, recheck his CBC in the morning, place him on IV Protonix every 12 hours, check Hemoccult.  3. Smoker Nicotine patch  4. Hallucinations Likely due to withdrawal, continue monitoring  Consults: Psychiatric  DVT Prophylaxis: TED Nutrition: Clear liquid diet  Code Status: Full  Disposition: Admitted to inpatient in step-down unit.  Author: Lynden OxfordPranav Roselin Wiemann, MD [facility] Pager: [phone] 04/08/2013, 4:48 AM    If 7PM-7AM, please contact night-coverage [email or web address] Password [email or web address]

## 2013-04-08 NOTE — Progress Notes (Signed)
PATIENT DETAILS Name: Steve MewBenjamin B Marney Age: 33 y.o. Sex: male Date of Birth: 05/24/1980 Admit Date: 04/07/2013 Admitting Physician Lynden OxfordPranav Patel, MD PCP:KIM, Damita LackHANNAH R., DO  Subjective: Confused, Delirious-seems to be hallucinating at times. Is able to answer some questions-Drinks ETOH, Cocaine, Marijuana,Methamphetamine, Heroin and Benzo's  Assessment/Plan: Principal Problem: Acute Encephalopathy -secondary to withdrawal from the above noted agents-getting frequent IV Lorazepam-d/w Critical care-very high risk for life threatening withdrawls-recommended to start Precedex infusion. -c/w Ativan prn per CIWA protocol  Active Problems: Drug Withdrawal-Heroin/Benz'os/Etoh/Cocaine -supportive care -on Precedex infusion -MVI/Thiamine/Folate, IVF  Hemetemesis -resolved-? From retching/vomiting -c/w PPI -Hb stable -if recurs or worsens-will consult GI  Long standing hx of Anxiety -as needed Beno's  Hx of Polysubstance use  -spoke with mother-patient is adopted-has been having issues with anxiety, substance abuse since 33 years of age. Has gone to multiple drug rehab centers and has had recurrent relapses. -psych consult when more stable.Likely will need transfer to inpatient detox on discharge.  Disposition: Remain inpatient  DVT Prophylaxis: Prophylactic Lovenox   Code Status: Full code   Family Communication Mother at bedside  Procedures:  None  CONSULTS:  pulmonary/intensive care  Time spent 40 minutes-which includes 50% of the time with face-to-face with patient/ family and coordinating care related to the above assessment and plan.    MEDICATIONS: Scheduled Meds: . dexmedetomidine  1 mcg/kg Intravenous Once  . enoxaparin (LOVENOX) injection  40 mg Subcutaneous Q24H  . folic acid  1 mg Oral Daily  . multivitamin with minerals  1 tablet Oral Daily  . nicotine  21 mg Transdermal Daily  . pantoprazole (PROTONIX) IV  40 mg Intravenous Q12H  . sodium  chloride  3 mL Intravenous Q12H  . thiamine  100 mg Oral Daily   Continuous Infusions: . dexmedetomidine     PRN Meds:.dicyclomine, hydrOXYzine, loperamide, LORazepam, LORazepam, ondansetron (ZOFRAN) IV, ondansetron  Antibiotics: Anti-infectives   None       PHYSICAL EXAM: Vital signs in last 24 hours: Filed Vitals:   04/08/13 0748 04/08/13 0800 04/08/13 0900 04/08/13 1000  BP:      Pulse:      Temp: 97.6 F (36.4 C)     TempSrc:      Resp:  20 19 17   SpO2:        Weight change:  There were no vitals filed for this visit. There is no weight on file to calculate BMI.   Gen Exam:Confused, with clear speech.   Neck: Supple, No JVD.   Chest: B/L Clear.   CVS: S1 S2 Regular, no murmurs.  Abdomen: soft, BS +, non tender, non distended.  Extremities: no edema, lower extremities warm to touch. Neurologic: Non Focal.  Skin: No Rash.   Wounds: N/A.    Intake/Output from previous day:  Intake/Output Summary (Last 24 hours) at 04/08/13 1139 Last data filed at 04/08/13 1000  Gross per 24 hour  Intake    680 ml  Output      0 ml  Net    680 ml     LAB RESULTS: CBC  Recent Labs Lab 04/07/13 2315 04/08/13 0340  WBC 5.8 4.6  HGB 13.7 11.9*  HCT 41.3 36.4*  PLT 300 277  MCV 90.2 89.9  MCH 29.9 29.4  MCHC 33.2 32.7  RDW 13.6 13.5  LYMPHSABS 2.2  --   MONOABS 0.6  --   EOSABS 0.1  --   BASOSABS 0.0  --  Chemistries   Recent Labs Lab 04/07/13 2315 04/08/13 0340  NA 137 141  K 3.9 3.9  CL 99 105  CO2 23 23  GLUCOSE 107* 147*  BUN 17 17  CREATININE 0.99 1.07  CALCIUM 9.2 8.8    CBG: No results found for this basename: GLUCAP,  in the last 168 hours  GFR The CrCl is unknown because both a height and weight (above a minimum accepted value) are required for this calculation.  Coagulation profile  Recent Labs Lab 04/08/13 0340  INR 0.98    Cardiac Enzymes No results found for this basename: CK, CKMB, TROPONINI, MYOGLOBIN,  in the last  168 hours  No components found with this basename: POCBNP,  No results found for this basename: DDIMER,  in the last 72 hours No results found for this basename: HGBA1C,  in the last 72 hours No results found for this basename: CHOL, HDL, LDLCALC, TRIG, CHOLHDL, LDLDIRECT,  in the last 72 hours No results found for this basename: TSH, T4TOTAL, FREET3, T3FREE, THYROIDAB,  in the last 72 hours No results found for this basename: VITAMINB12, FOLATE, FERRITIN, TIBC, IRON, RETICCTPCT,  in the last 72 hours No results found for this basename: LIPASE, AMYLASE,  in the last 72 hours  Urine Studies No results found for this basename: UACOL, UAPR, USPG, UPH, UTP, UGL, UKET, UBIL, UHGB, UNIT, UROB, ULEU, UEPI, UWBC, URBC, UBAC, CAST, CRYS, UCOM, BILUA,  in the last 72 hours  MICROBIOLOGY: Recent Results (from the past 240 hour(s))  MRSA PCR SCREENING     Status: None   Collection Time    04/08/13  2:33 AM      Result Value Ref Range Status   MRSA by PCR NEGATIVE  NEGATIVE Final   Comment:            The GeneXpert MRSA Assay (FDA     approved for NASAL specimens     only), is one component of a     comprehensive MRSA colonization     surveillance program. It is not     intended to diagnose MRSA     infection nor to guide or     monitor treatment for     MRSA infections.    RADIOLOGY STUDIES/RESULTS: No results found.  Jeoffrey Massed, MD  [facility] Pager:[phone]  If 7PM-7AM, please contact night-coverage [email or web address] Password [facility] 04/08/2013, 11:39 AM   LOS: 1 day

## 2013-04-08 NOTE — Progress Notes (Signed)
Clinical Social Work  CSW received referral in order to complete assessment. CSW spoke with bedside RN who reports that patient has been agitated and now on drip. CSW will follow up when patient is alert and oriented and able to complete assessment. No family at bedside.  [name], Kentucky[place]CSW [phone]

## 2013-04-08 NOTE — Progress Notes (Signed)
UR completed. Tibor Lemmons RN CCM Case Mgmt phone [phone] 

## 2013-04-08 NOTE — Consult Note (Signed)
PULMONARY  / CRITICAL CARE MEDICINE  Name: Steve Bass MRN: [id number] DOB: 07/17/1980 PCP Terressa Koyanagi., DO    ADMISSION DATE:  04/07/2013  LOS 1 days   CONSULTATION DATE:  04/08/13   REFERRING MD :  Dr Lucrezia Starch PRIMARY SERVICE: [facility]  CHIEF COMPLAINT:  Acute encephalopathy  BRIEF PATIENT DESCRIPTION: acute encephalopathy due to substance abuse / abuse withdrawal  SIGNIFICANT EVENTS / STUDIES:  04/07/2013 - admit 04/07/13 - U TOX - PAN POSITIVE EXCEPT BARBITUATES  LINES / TUBES: none  CULTURES: none  ANTIBIOTICS: none  HISTORY OF PRESENT ILLNESS:  33 year old adopted male, hx of substance abuse since age 32, ADD, Anxiety/Depression. PTSd admitted with acute enceophalopathy due to withdrawal. Drinks fifth to a quarter of vodka daily (last 1 day prior to admit). U Tox positive for amphetamine marijuana, narcs, benzo, cocaine. In SDU, continued hallucinations though calm but needing repeated reassurance despite ativan every 1-2h. PCCM therefore consulted. Mom says high risk for decompensation  PAST MEDICAL HISTORY :  Past Medical History  Diagnosis Date  . ADD (attention deficit disorder with hyperactivity)   . Anxiety and depression   . Shingles   . GERD (gastroesophageal reflux disease)     LA classification Grade C EE  . PTSD (post-traumatic stress disorder)   . Obesity      Family History  Problem Relation Age of Onset  . Adopted: Yes  . Diabetes Paternal Grandfather      History   Social History  . Marital Status: Single    Spouse Name: N/A    Number of Children: 0  . Years of Education: N/A   Occupational History  . student    Social History Main Topics  . Smoking status: Former Smoker -- 0.50 packs/day for 15 years    Types: Cigarettes    Quit date: 01/07/2013  . Smokeless tobacco: Never Used     Comment: tobacco handout given 02/21/2011  . Alcohol Use: 0.0 oz/week     Comment: 10 beers in each sitting  . Drug Use: Yes    Special:  "Crack" cocaine, Marijuana, Oxycodone, Cocaine     Comment: heroine, percocets  . Sexual Activity: Yes    Birth Control/ Protection: Condom   Other Topics Concern  . Not on file   Social History Narrative  . No narrative on file     Allergies  Allergen Reactions  . Aleve [Naproxen Sodium] Other (See Comments)    He says he gets like a weird feeling   . Cefaclor Rash  . Toradol [Ketorolac Tromethamine] Rash  . Tramadol Rash      (Not in an outpatient encounter)     REVIEW OF SYSTEMS:  Per HPI  SUBJECTIVE:   VITAL SIGNS: Filed Vitals:   04/08/13 0748 04/08/13 0800 04/08/13 0900 04/08/13 1000  BP:      Pulse:      Temp: 97.6 F (36.4 C)     TempSrc:      Resp:  20 19 17   SpO2:          HEMODYNAMICS:   VENTILATOR SETTINGS: Vent Mode:  [-] PRVC FiO2 (%):  [30 %] 30 % Set Rate:  [28 bmp] 28 bmp Vt Set:  [420 mL] 420 mL PEEP:  [5 cmH20] 5 cmH20 Plateau Pressure:  [4 cmH20-21 cmH20] 20 cmH20 INTAKE / OUTPUT: I/O last 3 completed shifts: In: 380 [I.V.:380] Out: -      PHYSICAL EXAMINATION: General:   Obese Psych: hallucinating,  crying,  Neuro:  Oriented x 1. Moves all 4s. RASS +2 HEENT:  Neck supple. No nodes Cardiovascular:  Normal heart sounds Lungs:  CTA bilaterally No distress Abdomen:  Soft, no mass Musculoskeletal:  No cyanosis, No clubbing,. No edema Skin:  intact  LABS: PULMONARY No results found for this basename: PHART, PCO2, PCO2ART, PO2, PO2ART, HCO3, TCO2, O2SAT,  in the last 168 hours  CBC  Recent Labs Lab 04/07/13 2315 04/08/13 0340  HGB 13.7 11.9*  HCT 41.3 36.4*  WBC 5.8 4.6  PLT 300 277    COAGULATION  Recent Labs Lab 04/08/13 0340  INR 0.98    CARDIAC  No results found for this basename: TROPONINI,  in the last 168 hours No results found for this basename: PROBNP,  in the last 168 hours   CHEMISTRY  Recent Labs Lab 04/07/13 2315 04/08/13 0340  NA 137 141  K 3.9 3.9  CL 99 105  CO2 23 23  GLUCOSE  107* 147*  BUN 17 17  CREATININE 0.99 1.07  CALCIUM 9.2 8.8   The CrCl is unknown because both a height and weight (above a minimum accepted value) are required for this calculation.   LIVER  Recent Labs Lab 04/07/13 2315 04/08/13 0340  AST 27 24  ALT 24 21  ALKPHOS 93 82  BILITOT 0.4 0.3  PROT 7.4 6.2  ALBUMIN 4.2 3.4*  INR  --  0.98     INFECTIOUS No results found for this basename: LATICACIDVEN, PROCALCITON,  in the last 168 hours   ENDOCRINE CBG (last 3)  No results found for this basename: GLUCAP,  in the last 72 hours       IMAGING x48h  No results found.     ASSESSMENT / PLAN:  PULMONARY A:at risk for intubation due to encephalopathy P:   Monitor in ICU  CARDIOVASCULAR A: at [facility] for SVT P:  Monitor  RENAL A:  Normal P:   monitor  GASTROINTESTINAL A:  Normal P:   Keep npo except clears, soft solids, meds  HEMATOLOGIC A:  Normal P:  Per [facility]  INFECTIOUS A:  No evidence of infection P:   monitor  ENDOCRINE A:  Nil acute   P:   monitor  NEUROLOGIC A:  Acute encephalopathy P:   Start precdex Intubate if worse  TODAY'S SUMMARY: mom updated/ d/w dr Lucrezia StarchGhimre. PCCM consult  The patient is critically ill with multiple organ systems failure and requires high complexity decision making for assessment and support, frequent evaluation and titration of therapies, application of advanced monitoring technologies and extensive interpretation of multiple databases.   Critical Care Time devoted to patient care services described in this note is  30  Minutes.  Dr. Kalman ShanMurali Nyellie Yetter, M.D., [facility].C.C.P Pulmonary and Critical Care Medicine Staff Physician [facility] Pager: [phone], If no answer or between  15:00h - 7:00h: call [phone]  04/08/2013 11:35 AM

## 2013-04-09 DIAGNOSIS — F1910 Other psychoactive substance abuse, uncomplicated: Secondary | ICD-10-CM

## 2013-04-09 DIAGNOSIS — F1920 Other psychoactive substance dependence, uncomplicated: Secondary | ICD-10-CM

## 2013-04-09 LAB — COMPREHENSIVE METABOLIC PANEL
ALK PHOS: 73 U/L (ref 39–117)
ALT: 21 U/L (ref 0–53)
AST: 23 U/L (ref 0–37)
Albumin: 3.4 g/dL — ABNORMAL LOW (ref 3.5–5.2)
BUN: 11 mg/dL (ref 6–23)
CO2: 25 meq/L (ref 19–32)
Calcium: 9.1 mg/dL (ref 8.4–10.5)
Chloride: 103 mEq/L (ref 96–112)
Creatinine, Ser: 0.95 mg/dL (ref 0.50–1.35)
GFR calc non Af Amer: >90 mL/min (ref >90)
GLUCOSE: 124 mg/dL — AB (ref 70–99)
POTASSIUM: 4.4 meq/L (ref 3.7–5.3)
SODIUM: 140 meq/L (ref 137–147)
Total Bilirubin: 0.3 mg/dL (ref 0.3–1.2)
Total Protein: 6.2 g/dL (ref 6.0–8.3)

## 2013-04-09 LAB — CBC
HEMATOCRIT: 39.6 % (ref 39.0–52.0)
HEMOGLOBIN: 12.6 g/dL — AB (ref 13.0–17.0)
MCH: 29.4 pg (ref 26.0–34.0)
MCHC: 31.8 g/dL (ref 30.0–36.0)
MCV: 92.5 fL (ref 78.0–100.0)
Platelets: 248 10*3/uL (ref 150–400)
RBC: 4.28 MIL/uL (ref 4.22–5.81)
RDW: 13.9 % (ref 11.5–15.5)
WBC: 5.6 10*3/uL (ref 4.0–10.5)

## 2013-04-09 LAB — URINE CULTURE: Colony Count: >=100000

## 2013-04-09 LAB — MAGNESIUM: Magnesium: 1.8 mg/dL (ref 1.5–2.5)

## 2013-04-09 MED ORDER — CHLORDIAZEPOXIDE HCL 25 MG PO CAPS
25.0000 mg | ORAL_CAPSULE | Freq: Three times a day (TID) | ORAL | Status: DC
Start: 2013-04-09 — End: 2013-04-11
  Administered 2013-04-09 – 2013-04-11 (×6): 25 mg via ORAL
  Filled 2013-04-09 (×6): qty 1

## 2013-04-09 MED ORDER — DEXMEDETOMIDINE HCL IN NACL 400 MCG/100ML IV SOLN
0.4000 ug/kg/h | INTRAVENOUS | Status: AC
  Administered 2013-04-09 – 2013-04-10 (×2): 0.4 ug/kg/h via INTRAVENOUS
  Administered 2013-04-10: 0.5 ug/kg/h via INTRAVENOUS
  Administered 2013-04-10: 0.4 ug/kg/h via INTRAVENOUS
  Administered 2013-04-10: 0.6 ug/kg/h via INTRAVENOUS
  Filled 2013-04-09 (×5): qty 100

## 2013-04-09 MED ORDER — CLONAZEPAM 0.5 MG PO TABS
0.5000 mg | ORAL_TABLET | Freq: Two times a day (BID) | ORAL | Status: DC
  Administered 2013-04-09: 0.5 mg via ORAL
  Filled 2013-04-09: qty 1

## 2013-04-09 MED ORDER — PANTOPRAZOLE SODIUM 40 MG PO TBEC
40.0000 mg | DELAYED_RELEASE_TABLET | Freq: Two times a day (BID) | ORAL | Status: DC
  Administered 2013-04-09 – 2013-04-11 (×4): 40 mg via ORAL
  Filled 2013-04-09 (×6): qty 1

## 2013-04-09 NOTE — Progress Notes (Signed)
PATIENT DETAILS Name: Steve Bass Age: 33 y.o. Sex: male Date of Birth: 1980/09/03 Admit Date: 04/07/2013 Admitting Physician Lynden Oxford, MD PCP:KIM, Damita Lack., DO  Subjective: Confused, Delirious-seems to be hallucinating at times. Is able to answer some questions-Drinks ETOH, Cocaine, Marijuana,Methamphetamine, Heroin and Benzo's   Assessment/Plan:      Acute Encephalopathy -secondary to withdrawal from  ETOH, Cocaine, Marijuana,Methamphetamine, Heroin and Benzo's  -getting scheduled klonopin, frequent IV Ativan per CIWA-  Critical care- following as very high risk for life threatening withdrawls- continue on Precedex infusion. -MVI/Thiamine/Folate, IVF for ETOH.  - Counseled to quit all. Psych called as well.        Hemetemesis -resolved-? From retching/vomiting -c/w PPI -Hb stable -if recurs or worsens-will consult GI      Long standing hx of Anxiety -as needed Beno's      Hx of Polysubstance use  -spoke with mother-patient is adopted-has been having issues with anxiety, substance abuse since 33 years of age. Has gone to multiple drug rehab centers and has had recurrent relapses. -psych consulted.Likely will need transfer to inpatient detox on discharge.      Disposition: Remain inpatient, once stable per Psych.    DVT Prophylaxis: Prophylactic Lovenox     Code Status: Full code     Family Communication Mother at bedside by Dr Jerral Ralph on 04-08-13    Procedures:  None    CONSULTS:  pulmonary/intensive care, Psych    Time spent 40 minutes-which includes 50% of the time with face-to-face with patient/ family and coordinating care related to the above assessment and plan.    MEDICATIONS: Scheduled Meds: . enoxaparin (LOVENOX) injection  40 mg Subcutaneous Q24H  . folic acid  1 mg Oral Daily  . multivitamin with minerals  1 tablet Oral Daily  . nicotine  21 mg Transdermal Daily  . pantoprazole (PROTONIX) IV   40 mg Intravenous Q12H  . sodium chloride  3 mL Intravenous Q12H  . thiamine  100 mg Oral Daily   Continuous Infusions: . dexmedetomidine 1.201 mcg/kg/hr (04/09/13 0800)   PRN Meds:.dicyclomine, hydrOXYzine, loperamide, LORazepam, LORazepam, ondansetron (ZOFRAN) IV, ondansetron  Antibiotics: Anti-infectives   None       PHYSICAL EXAM: Vital signs in last 24 hours: Filed Vitals:   04/09/13 0405 04/09/13 0425 04/09/13 0600 04/09/13 0800  BP:   118/79 110/68  Pulse: 99  66 74  Temp: 98.2 F (36.8 C)   98.4 F (36.9 C)  TempSrc: Oral   Oral  Resp:   20 14  Weight:  119.1 kg (262 lb 9.1 oz)    SpO2:   92% 95%    Weight change:  Filed Weights   04/08/13 1100 04/09/13 0425  Weight: 117.6 kg (259 lb 4.2 oz) 119.1 kg (262 lb 9.1 oz)   Body mass index is 37.13 kg/(m^2).   Gen Exam: NAD, with clear speech.   Neck: Supple, No JVD.   Chest: B/L Clear.   CVS: S1 S2 Regular, no murmurs.  Abdomen: soft, BS +, non tender, non distended.  Extremities: no edema, lower extremities warm to touch. Neurologic: Non Focal.  Skin: No Rash.   Wounds: N/A.    Intake/Output from previous day:  Intake/Output Summary (Last 24 hours) at 04/09/13 0912 Last data filed at 04/09/13 0800  Gross per 24 hour  Intake 1567.93 ml  Output    600 ml  Net 967.93 ml     LAB RESULTS: CBC  Recent  Labs Lab 04/07/13 2315 04/08/13 0340 04/09/13 0315  WBC 5.8 4.6 5.6  HGB 13.7 11.9* 12.6*  HCT 41.3 36.4* 39.6  PLT 300 277 248  MCV 90.2 89.9 92.5  MCH 29.9 29.4 29.4  MCHC 33.2 32.7 31.8  RDW 13.6 13.5 13.9  LYMPHSABS 2.2  --   --   MONOABS 0.6  --   --   EOSABS 0.1  --   --   BASOSABS 0.0  --   --     Chemistries   Recent Labs Lab 04/07/13 2315 04/08/13 0340 04/09/13 0315  NA 137 141 140  K 3.9 3.9 4.4  CL 99 105 103  CO2 23 23 25   GLUCOSE 107* 147* 124*  BUN 17 17 11   CREATININE 0.99 1.07 0.95  CALCIUM 9.2 8.8 9.1  MG  --   --  1.8    CBG: No results found for this  basename: GLUCAP,  in the last 168 hours  GFR The CrCl is unknown because both a height and weight (above a minimum accepted value) are required for this calculation.  Coagulation profile  Recent Labs Lab 04/08/13 0340  INR 0.98    Cardiac Enzymes No results found for this basename: CK, CKMB, TROPONINI, MYOGLOBIN,  in the last 168 hours  No components found with this basename: POCBNP,  No results found for this basename: DDIMER,  in the last 72 hours No results found for this basename: HGBA1C,  in the last 72 hours No results found for this basename: CHOL, HDL, LDLCALC, TRIG, CHOLHDL, LDLDIRECT,  in the last 72 hours No results found for this basename: TSH, T4TOTAL, FREET3, T3FREE, THYROIDAB,  in the last 72 hours No results found for this basename: VITAMINB12, FOLATE, FERRITIN, TIBC, IRON, RETICCTPCT,  in the last 72 hours No results found for this basename: LIPASE, AMYLASE,  in the last 72 hours  Urine Studies No results found for this basename: UACOL, UAPR, USPG, UPH, UTP, UGL, UKET, UBIL, UHGB, UNIT, UROB, ULEU, UEPI, UWBC, URBC, UBAC, CAST, CRYS, UCOM, BILUA,  in the last 72 hours  MICROBIOLOGY: Recent Results (from the past 240 hour(s))  MRSA PCR SCREENING     Status: None   Collection Time    04/08/13  2:33 AM      Result Value Ref Range Status   MRSA by PCR NEGATIVE  NEGATIVE Final   Comment:            The GeneXpert MRSA Assay (FDA     approved for NASAL specimens     only), is one component of a     comprehensive MRSA colonization     surveillance program. It is not     intended to diagnose MRSA     infection nor to guide or     monitor treatment for     MRSA infections.    RADIOLOGY STUDIES/RESULTS: No results found.  Leroy SeaSINGH,Diezel Mazur K, MD  [facility] Pager:[phone]  If 7PM-7AM, please contact night-coverage [email or web address] Password [email or web address] 04/09/2013, 9:12 AM   LOS: 2 days

## 2013-04-09 NOTE — Progress Notes (Addendum)
PHARMACY - BRIEF NOTE  This patient is receiving pantoprazole. Based on criteria approved by the Pharmacy and Therapeutics Committee, this medication is being converted to the equivalent oral dose form. These criteria include:   . The patient is eating (either orally or per tube) and/or has been taking other orally administered medications for at least 24 hours.  . This patient has no evidence of ACTIVE gastrointestinal bleeding or impaired GI absorption (gastrectomy, short bowel, patient on TNA or NPO).   Discussed with Dr. Thedore MinsSingh  If you have questions about this conversion, please contact the pharmacy department.  Juliette Alcideustin Zeigler, PharmD, BCPS.   Pager: [phone]  04/09/2013 11:54 AM

## 2013-04-09 NOTE — ED Provider Notes (Signed)
Medical screening examination/treatment/procedure(s) were performed by non-physician practitioner and as supervising physician I was immediately available for consultation/collaboration.   EKG Interpretation None        Charles B. Sheldon, MD 04/09/13 2321 

## 2013-04-09 NOTE — Progress Notes (Signed)
Paged Dr.Singh and notified of CIWA score.  He instructed me to page critical care.  Called and spoke with critical care RN Magda Paganiniudrey, she will discuss with MD on call and get back to me.  Ardyth GalAnderson, Jayven Naill Ann, RN 04/09/2013

## 2013-04-09 NOTE — Progress Notes (Signed)
eLink Physician-Brief Progress Note Patient Name: Steve MewBenjamin B Dolores DOB: 06/08/1980 MRN: [id number]  Date of Service  04/09/2013   HPI/Events of Note   Pt with ongoing agitation  eICU Interventions  Resume precedex   Intervention Category Major Interventions: Delirium, psychosis, severe agitation - evaluation and management  Shan Levansatrick Sandy Blouch 04/09/2013, 8:10 PM

## 2013-04-09 NOTE — Consult Note (Signed)
[facility] Consult   Reason for Consult:  Psychosis, anxiety, polysubstance dependence Referring Physician:  Dr Susann Givens is an 32 y.o. male. Total Time spent with patient: 30 minutes  Assessment: AXIS I:  Substance Abuse and Polysubstance dependence AXIS II:  Deferred AXIS III:   Past Medical History  Diagnosis Date  . ADD (attention deficit disorder with hyperactivity)   . Anxiety and depression   . Shingles   . GERD (gastroesophageal reflux disease)     LA classification Grade C EE  . PTSD (post-traumatic stress disorder)   . Obesity    AXIS IV:  other psychosocial or environmental problems and problems with primary support group AXIS V:  41-50 serious symptoms  Plan:  Recommend psychiatric Inpatient admission when medically cleared.  Subjective:   Steve Bass is a 33 y.o. male patient admitted with vomiting and using drugs.  HPI:  The patient is a 33 year old Caucasian man who admitted to the medical floor because of persistent vomiting and abdominal pain.  The patient is using multiple substances including IV heroine, cocaine, marijuana and alcohol.  The patient told he relapsed into drugs for 5 months ago.  Patient lives with her mother who is very supportive.  He admitted his drug of choice is opiates and he is been using 4-6 Roxicodone every day.  The patient told that sometime if he does not get opiates and he drinks alcohol use marijuana and also used intravenous heroin.  Patient appears very tearful withdrawn and depressed.  When patient came in he was having visual hallucination seeing shadows and feeling paranoid but these symptoms are getting better.  He has not hearing any voices in past 24 hours.  Patient was to get some help.  He continues to have symptoms of palpitations, crawling under his skin and restlessness.  He also endorses anxiety and insomnia.  He is getting medication for his detox.  Patient denies any suicidal thoughts  or homicidal thoughts however he does not feel safe by himself.  Patient and his mother and trusted to detox treatment.  The patient has history of psychiatric inpatient treatment and rehabilitation treatment in the past.  He was admitted to behavioral [place] [facility] for alcohol-related disorder.  He was seeing Dr. Sabra Heck until recently Dr. Sabra Heck close his practice and he is unable to see him in 2 months.  He admitted sometimes not taking his medication on time.  Patient has done [facility] IOP program in the past with good response.  Patient denies any history of suicidal attempt in the past.  He admitted using drugs in his teens.  The patient admitted history of paranoia, psychosis and hallucinations when he uses the drugs.  Patient admitted his current use is causing significant distress in his life.  He is unable to function and needed some help.   Past Psychiatric History: Past Medical History  Diagnosis Date  . ADD (attention deficit disorder with hyperactivity)   . Anxiety and depression   . Shingles   . GERD (gastroesophageal reflux disease)     LA classification Grade C EE  . PTSD (post-traumatic stress disorder)   . Obesity     reports that he quit smoking about 3 months ago. His smoking use included Cigarettes. He has a 7.5 pack-year smoking history. He has never used smokeless tobacco. He reports that he drinks alcohol. He reports that he uses illicit drugs ("Crack" cocaine, Marijuana, Oxycodone, and Cocaine). Family History  Problem Relation  Age of Onset  . Adopted: Yes  . Diabetes Paternal Grandfather          Abuse/Neglect [facility]) Physical Abuse: Denies Verbal Abuse: Denies Sexual Abuse: Denies Allergies:   Allergies  Allergen Reactions  . Aleve [Naproxen Sodium] Other (See Comments)    He says he gets like a weird feeling   . Cefaclor Rash  . Toradol [Ketorolac Tromethamine] Rash  . Tramadol Rash    ACT Assessment Complete:  Yes:    Educational Status    Risk to  Self: Risk to self Is patient at risk for suicide?: No Substance abuse history and/or treatment for substance abuse?: Yes  Risk to Others:    Abuse: Abuse/Neglect Assessment (Assessment to be complete while patient is alone) Physical Abuse: Denies Verbal Abuse: Denies Sexual Abuse: Denies Exploitation of patient/patient's resources: Denies Self-Neglect: Denies  Prior Inpatient Therapy:    Prior Outpatient Therapy:    Additional Information:                    Objective: Blood pressure 129/92, pulse 65, temperature 98.6 F (37 C), temperature source Oral, resp. rate 16, height 6' 3" (1.905 m), weight 259 lb 4.2 oz (117.6 kg), SpO2 96.00%.Body mass index is 32.41 kg/(m^2). Results for orders placed during the hospital encounter of 04/07/13 (from the past 72 hour(s))  CBC WITH DIFFERENTIAL     Status: None   Collection Time    04/07/13 11:15 PM      Result Value Ref Range   WBC 5.8  4.0 - 10.5 K/uL   RBC 4.58  4.22 - 5.81 MIL/uL   Hemoglobin 13.7  13.0 - 17.0 g/dL   HCT 41.3  39.0 - 52.0 %   MCV 90.2  78.0 - 100.0 fL   MCH 29.9  26.0 - 34.0 pg   MCHC 33.2  30.0 - 36.0 g/dL   RDW 13.6  11.5 - 15.5 %   Platelets 300  150 - 400 K/uL   Neutrophils Relative % 50  43 - 77 %   Neutro Abs 2.9  1.7 - 7.7 K/uL   Lymphocytes Relative 38  12 - 46 %   Lymphs Abs 2.2  0.7 - 4.0 K/uL   Monocytes Relative 10  3 - 12 %   Monocytes Absolute 0.6  0.1 - 1.0 K/uL   Eosinophils Relative 2  0 - 5 %   Eosinophils Absolute 0.1  0.0 - 0.7 K/uL   Basophils Relative 1  0 - 1 %   Basophils Absolute 0.0  0.0 - 0.1 K/uL  COMPREHENSIVE METABOLIC PANEL     Status: Abnormal   Collection Time    04/07/13 11:15 PM      Result Value Ref Range   Sodium 137  137 - 147 mEq/L   Potassium 3.9  3.7 - 5.3 mEq/L   Chloride 99  96 - 112 mEq/L   CO2 23  19 - 32 mEq/L   Glucose, Bld 107 (*) 70 - 99 mg/dL   BUN 17  6 - 23 mg/dL   Creatinine, Ser 0.99  0.50 - 1.35 mg/dL   Calcium 9.2  8.4 - 10.5 mg/dL    Total Protein 7.4  6.0 - 8.3 g/dL   Albumin 4.2  3.5 - 5.2 g/dL   AST 27  0 - 37 U/L   ALT 24  0 - 53 U/L   Alkaline Phosphatase 93  39 - 117 U/L   Total Bilirubin 0.4  0.3 -  1.2 mg/dL   GFR calc non Af Amer >90  >90 mL/min   GFR calc Af Amer >90  >90 mL/min   Comment: (NOTE)     The eGFR has been calculated using the CKD EPI equation.     This calculation has not been validated in all clinical situations.     eGFR's persistently <90 mL/min signify possible Chronic Kidney     Disease.  ETHANOL     Status: None   Collection Time    04/07/13 11:15 PM      Result Value Ref Range   Alcohol, Ethyl (B) <11  0 - 11 mg/dL   Comment:            LOWEST DETECTABLE LIMIT FOR     SERUM ALCOHOL IS 11 mg/dL     FOR MEDICAL PURPOSES ONLY  URINE RAPID DRUG SCREEN (HOSP PERFORMED)     Status: Abnormal   Collection Time    04/07/13 11:28 PM      Result Value Ref Range   Opiates POSITIVE (*) NONE DETECTED   Cocaine POSITIVE (*) NONE DETECTED   Benzodiazepines POSITIVE (*) NONE DETECTED   Amphetamines POSITIVE (*) NONE DETECTED   Tetrahydrocannabinol POSITIVE (*) NONE DETECTED   Barbiturates NONE DETECTED  NONE DETECTED   Comment:            DRUG SCREEN FOR MEDICAL PURPOSES     ONLY.  IF CONFIRMATION IS NEEDED     FOR ANY PURPOSE, NOTIFY LAB     WITHIN 5 DAYS.                LOWEST DETECTABLE LIMITS     FOR URINE DRUG SCREEN     Drug Class       Cutoff (ng/mL)     Amphetamine      1000     Barbiturate      200     Benzodiazepine   161     Tricyclics       096     Opiates          300     Cocaine          300     THC              50  URINE CULTURE     Status: None   Collection Time    04/07/13 11:28 PM      Result Value Ref Range   Specimen Description URINE, CATHETERIZED     Special Requests NONE     Culture  Setup Time       Value: 04/08/2013 09:51     Performed at [facility]       Value: >=100,000 COLONIES/ML     Performed at [facility]    Culture       Value: VIRIDANS STREPTOCOCCUS     Performed at [facility]   Report Status 04/09/2013 FINAL    URINALYSIS, ROUTINE W REFLEX MICROSCOPIC     Status: Abnormal   Collection Time    04/07/13 11:29 PM      Result Value Ref Range   Color, Urine YELLOW  YELLOW   APPearance CLOUDY (*) CLEAR   Specific Gravity, Urine 1.031 (*) 1.005 - 1.030   pH 5.5  5.0 - 8.0   Glucose, UA NEGATIVE  NEGATIVE mg/dL   Hgb urine dipstick NEGATIVE  NEGATIVE   Bilirubin Urine SMALL (*) NEGATIVE  Ketones, ur >80 (*) NEGATIVE mg/dL   Protein, ur 30 (*) NEGATIVE mg/dL   Urobilinogen, UA 0.2  0.0 - 1.0 mg/dL   Nitrite NEGATIVE  NEGATIVE   Leukocytes, UA SMALL (*) NEGATIVE  URINE MICROSCOPIC-ADD ON     Status: Abnormal   Collection Time    04/07/13 11:29 PM      Result Value Ref Range   Squamous Epithelial / LPF FEW (*) RARE   WBC, UA 21-50  <3 WBC/hpf   Bacteria, UA RARE  RARE   Urine-Other MUCOUS PRESENT    MRSA PCR SCREENING     Status: None   Collection Time    04/08/13  2:33 AM      Result Value Ref Range   MRSA by PCR NEGATIVE  NEGATIVE   Comment:            The GeneXpert MRSA Assay (FDA     approved for NASAL specimens     only), is one component of a     comprehensive MRSA colonization     surveillance program. It is not     intended to diagnose MRSA     infection nor to guide or     monitor treatment for     MRSA infections.  COMPREHENSIVE METABOLIC PANEL     Status: Abnormal   Collection Time    04/08/13  3:40 AM      Result Value Ref Range   Sodium 141  137 - 147 mEq/L   Potassium 3.9  3.7 - 5.3 mEq/L   Chloride 105  96 - 112 mEq/L   CO2 23  19 - 32 mEq/L   Glucose, Bld 147 (*) 70 - 99 mg/dL   BUN 17  6 - 23 mg/dL   Creatinine, Ser 1.07  0.50 - 1.35 mg/dL   Calcium 8.8  8.4 - 10.5 mg/dL   Total Protein 6.2  6.0 - 8.3 g/dL   Albumin 3.4 (*) 3.5 - 5.2 g/dL   AST 24  0 - 37 U/L   ALT 21  0 - 53 U/L   Alkaline Phosphatase 82  39 - 117 U/L   Total Bilirubin 0.3   0.3 - 1.2 mg/dL   GFR calc non Af Amer >90  >90 mL/min   GFR calc Af Amer >90  >90 mL/min   Comment: (NOTE)     The eGFR has been calculated using the CKD EPI equation.     This calculation has not been validated in all clinical situations.     eGFR's persistently <90 mL/min signify possible Chronic Kidney     Disease.  CBC     Status: Abnormal   Collection Time    04/08/13  3:40 AM      Result Value Ref Range   WBC 4.6  4.0 - 10.5 K/uL   RBC 4.05 (*) 4.22 - 5.81 MIL/uL   Hemoglobin 11.9 (*) 13.0 - 17.0 g/dL   HCT 36.4 (*) 39.0 - 52.0 %   MCV 89.9  78.0 - 100.0 fL   MCH 29.4  26.0 - 34.0 pg   MCHC 32.7  30.0 - 36.0 g/dL   RDW 13.5  11.5 - 15.5 %   Platelets 277  150 - 400 K/uL  PROTIME-INR     Status: None   Collection Time    04/08/13  3:40 AM      Result Value Ref Range   Prothrombin Time 12.8  11.6 - 15.2 seconds   INR  0.98  0.00 - 1.49  CBC     Status: Abnormal   Collection Time    04/09/13  3:15 AM      Result Value Ref Range   WBC 5.6  4.0 - 10.5 K/uL   RBC 4.28  4.22 - 5.81 MIL/uL   Hemoglobin 12.6 (*) 13.0 - 17.0 g/dL   HCT 39.6  39.0 - 52.0 %   MCV 92.5  78.0 - 100.0 fL   MCH 29.4  26.0 - 34.0 pg   MCHC 31.8  30.0 - 36.0 g/dL   RDW 13.9  11.5 - 15.5 %   Platelets 248  150 - 400 K/uL  MAGNESIUM     Status: None   Collection Time    04/09/13  3:15 AM      Result Value Ref Range   Magnesium 1.8  1.5 - 2.5 mg/dL  COMPREHENSIVE METABOLIC PANEL     Status: Abnormal   Collection Time    04/09/13  3:15 AM      Result Value Ref Range   Sodium 140  137 - 147 mEq/L   Potassium 4.4  3.7 - 5.3 mEq/L   Chloride 103  96 - 112 mEq/L   CO2 25  19 - 32 mEq/L   Glucose, Bld 124 (*) 70 - 99 mg/dL   BUN 11  6 - 23 mg/dL   Creatinine, Ser 0.95  0.50 - 1.35 mg/dL   Calcium 9.1  8.4 - 10.5 mg/dL   Total Protein 6.2  6.0 - 8.3 g/dL   Albumin 3.4 (*) 3.5 - 5.2 g/dL   AST 23  0 - 37 U/L   Comment: SLIGHT HEMOLYSIS     HEMOLYSIS AT THIS LEVEL MAY AFFECT RESULT   ALT 21  0 -  53 U/L   Alkaline Phosphatase 73  39 - 117 U/L   Total Bilirubin 0.3  0.3 - 1.2 mg/dL   GFR calc non Af Amer >90  >90 mL/min   GFR calc Af Amer >90  >90 mL/min   Comment: (NOTE)     The eGFR has been calculated using the CKD EPI equation.     This calculation has not been validated in all clinical situations.     eGFR's persistently <90 mL/min signify possible Chronic Kidney     Disease.   Labs are reviewed.  Current Facility-Administered Medications  Medication Dose Route Frequency Provider Last Rate Last Dose  . clonazePAM (KLONOPIN) tablet 0.5 mg  0.5 mg Oral BID Thurnell Lose, MD   0.5 mg at 04/09/13 0953  . dicyclomine (BENTYL) tablet 20 mg  20 mg Oral Q6H PRN Berle Mull, MD      . enoxaparin (LOVENOX) injection 40 mg  40 mg Subcutaneous Q24H Berle Mull, MD   40 mg at 04/09/13 0950  . folic acid (FOLVITE) tablet 1 mg  1 mg Oral Daily Brand Males, MD   1 mg at 04/09/13 0953  . hydrOXYzine (ATARAX/VISTARIL) tablet 25 mg  25 mg Oral Q6H PRN Berle Mull, MD      . loperamide (IMODIUM) capsule 2-4 mg  2-4 mg Oral PRN Berle Mull, MD      . LORazepam (ATIVAN) injection 1-2 mg  1-2 mg Intravenous Q1H PRN Brand Males, MD   1 mg at 04/09/13 1404  . LORazepam (ATIVAN) injection 2-3 mg  2-3 mg Intravenous Q1H PRN Berle Mull, MD   2 mg at 04/08/13 0707  . multivitamin with minerals tablet 1 tablet  1 tablet Oral Daily Brand Males, MD   1 tablet at 04/09/13 [phone]  . nicotine (NICODERM CQ - dosed in mg/24 hours) patch 21 mg  21 mg Transdermal Daily Margarita Mail, PA-C   21 mg at 04/09/13 0949  . ondansetron (ZOFRAN) tablet 4 mg  4 mg Oral Q6H PRN Berle Mull, MD       Or  . ondansetron [facility]) injection 4 mg  4 mg Intravenous Q6H PRN Berle Mull, MD   4 mg at 04/08/13 5003  . pantoprazole (PROTONIX) EC tablet 40 mg  40 mg Oral BID Clovis Riley, RPH      . sodium chloride 0.9 % injection 3 mL  3 mL Intravenous Q12H Berle Mull, MD   3 mL at 04/09/13 1000  .  thiamine (VITAMIN B-1) tablet 100 mg  100 mg Oral Daily Brand Males, MD   100 mg at 04/09/13 1142    Psychiatric Specialty Exam:     Blood pressure 129/92, pulse 65, temperature 98.6 F (37 C), temperature source Oral, resp. rate 16, height 6' 3" (1.905 m), weight 259 lb 4.2 oz (117.6 kg), SpO2 96.00%.Body mass index is 32.41 kg/(m^2).  General Appearance: Casual and Guarded  Eye Contact::  Fair  Speech:  Slow  Volume:  Decreased  Mood:  Anxious, Depressed and Dysphoric  Affect:  Blunt, Congruent, Constricted and Depressed  Thought Process:  Goal Directed  Orientation:  Full (Time, Place, and Person)  Thought Content:  Hallucinations: Visual  Suicidal Thoughts:  No  Homicidal Thoughts:  No  Memory:  Recent;   Fair Remote;   Fair  Judgement:  Intact  Insight:  Lacking  Psychomotor Activity:  Increased  Concentration:  Fair  Recall:  Poor  Fund of Knowledge:Fair  Language: Fair  Akathisia:  No  Handed:  Right  AIMS (if indicated):     Assets:  Communication Skills Desire for Improvement Housing  Sleep:      Musculoskeletal: Strength & Muscle Tone: within normal limits Gait & Station: normal Patient leans: N/A  Treatment Plan Summary: Medication management, patient is on Seroquel protocol and getting treatment for his drug use.  Once he is medically cleared transfer to behavioral [place] for further stabilization. The patient can be a good candidate for CDIOP program upon discharge from behavioral [place].  Please call [phone] if you have any further questions.    ARFEEN,SYED T. 04/09/2013 5:42 PM

## 2013-04-09 NOTE — Progress Notes (Signed)
Clinical Social Work Department CLINICAL SOCIAL WORK PSYCHIATRY SERVICE LINE ASSESSMENT 04/09/2013  Patient:  Steve Bass  Account:  [id number]  Admit Date:  04/07/2013  Clinical Social Worker:  Sindy Messing, LCSW  Date/Time:  04/09/2013 02:20 PM Referred by:  Physician  Date referred:  04/09/2013 Reason for Referral  Substance Abuse   Presenting Symptoms/Problems (In the person's/family's own words):   Psych consulted due to substance use.   Abuse/Neglect/Trauma History (check all that apply)  Denies history   Abuse/Neglect/Trauma Comments:   Psychiatric History (check all that apply)  Outpatient treatment  Inpatient/hospitilization   Psychiatric medications:  Klonopin 0.5 mg  Wellbutrin 300 mg  Effexor 150 mg  Trazodone 100 mg   Current Mental Health Hospitalizations/Previous Mental Health History:   Patient reports he has been diagnosed with anxiety, depression and ADD. Patient reports he follows up on outpatient basis for medication management and reports that he has been to inpatient facilities for substance abuse treatment as well.   Current provider:   Dr. Karilyn Cota and Date:   [place], [place]   Current Medications:   Scheduled Meds:      . clonazePAM  0.5 mg Oral BID  . enoxaparin (LOVENOX) injection  40 mg Subcutaneous Q24H  . folic acid  1 mg Oral Daily  . multivitamin with minerals  1 tablet Oral Daily  . nicotine  21 mg Transdermal Daily  . pantoprazole  40 mg Oral BID  . sodium chloride  3 mL Intravenous Q12H  . thiamine  100 mg Oral Daily        Continuous Infusions:      PRN Meds:.dicyclomine, hydrOXYzine, loperamide, LORazepam, LORazepam, ondansetron (ZOFRAN) IV, ondansetron       Previous Impatient Admission/Date/Reason:   Patient reports he went to [facility] a few months ago but was only sober for about 2 days after DC. Patient has been to [facility] several times in the past as well.   Emotional Health / Current Symptoms     Suicide/Self Harm  None reported   Suicide attempt in the past:   Patient denies any SI or HI.   Other harmful behavior:   Patient is chronic substance user.   Psychotic/Dissociative Symptoms  Auditory Hallucinations  Visual Hallucinations   Other Psychotic/Dissociative Symptoms:   Patient reports hallucinations when detoxing from drugs.    Attention/Behavioral Symptoms  Within Normal Limits   Other Attention / Behavioral Symptoms:   Patient drowsy at times but engaged in assessment.    Cognitive Impairment  Within Normal Limits   Other Cognitive Impairment:   Patient alert and oriented.    Mood and Adjustment  Flat    Stress, Anxiety, Trauma, Any Recent Loss/Stressor  Anxiety   Anxiety (frequency):   Patient reports he feels anxious and takes medication for anxiety. Patient reports that using substances helps decrease his anxiety levels.   Phobia (specify):   N/A   Compulsive behavior (specify):   N/A   Obsessive behavior (specify):   N/A   Other:   N/A   Substance Abuse/Use  Current substance use   SBIRT completed (please refer for detailed history):  Y  Self-reported substance use:   Patient reports he has been using heroin and cocaine for the past 5.5 years and drinking alcohol. Patient reports he first started using socially but then became addicted. Patient reports he came to the hospital because he has the desire to be sober. Patient states he is getting older and  wants to be able to live a healthier life.   Urinary Drug Screen Completed:  Y Alcohol level:   <11    Environmental/Housing/Living Arrangement  Stable housing   Who is in the home:   Mom   Emergency contact:  Location manager   Patient's Strengths and Goals (patient's own words):   Patient reports that parents are supportive and that he has motivation to remain sober. Patient is seeking treatment.   Clinical Social Worker's Interpretive  Summary:   CSW received referral in order to complete psychosocial assessment. CSW reviewed chart and met with patient at bedside. CSW introduced myself and explained role. Patient agreeable to assessment at this time.    Patient reports he has been using drugs and alcohol for the last 5 years but states that he came to the hospital voluntarily because he wants to be sober. Patient reports that he is living with family and in a good relationship and feels that he needs to get sober in order to be healthy. Patient reports that his consumption varies but that he consumes alcohol at least every other day and uses cocaine, heroin, and marijuana. Patient reports he has been to rehab in the past but was never motivated to remain sober so did not feel treatment was beneficial.    Patient agreeable to complete SBIRT and after high score, CSW spoke with patient re: treatment options. CSW explained inpatient, intensive outpatient, outpatient, and NA/AA meetings. Patient reports he has been for inpatient and does not feel it is beneficial. Patient reports that he wants to try intensive outpatient and spoke with psych MD re: [facility] IOP program at [facility]. Patient reports he follows up with Dr. Sabra Heck for medication management and feels it would be a good fit at [facility]. Patient reports although friends and family are supportive that he feels like he needs formal supports that can be a neutral third party. CSW and patient spoke about patient continuing [facility] treatment along with CD IOP and patient is agreeable.    Patient drowsy during assessment but engaged. Patient open to discussing MH and SA and feels that he is in a good place in his life to remain sober. Patient has good informal supports and could benefit from continued outpatient follow up. Patient is not working and has transportation to get to [facility]. CSW will continue to follow and will assist with and further recommendations provided by psych MD.   Disposition:  Recommend  Psych CSW continuing to support while in hospital   [place], [place] [phone]

## 2013-04-09 NOTE — Progress Notes (Signed)
eLink Physician-Brief Progress Note Patient Name: Steve MewBenjamin B Bass DOB: 11/28/1980 MRN: [id number]  Date of Service  04/09/2013   HPI/Events of Note  Pt with agitation d/t etoh WD  eICU Interventions  Plan to increase ativan   Intervention Category Major Interventions: Change in mental status - evaluation and management;Delirium, psychosis, severe agitation - evaluation and management  Shan Levansatrick Wright 04/09/2013, 7:13 PM

## 2013-04-09 NOTE — Consult Note (Signed)
PULMONARY  / CRITICAL CARE MEDICINE  Name: Steve MewBenjamin B Byas MRN: [id number] DOB: 03/18/1980 PCP Terressa KoyanagiKIM, HANNAH R., DO    ADMISSION DATE:  04/07/2013  LOS 2 days   CONSULTATION DATE:  04/08/13   REFERRING MD :  Dr Lucrezia StarchGhimre PRIMARY SERVICE: TRH  CHIEF COMPLAINT:  Acute encephalopathy  BRIEF PATIENT DESCRIPTION: acute encephalopathy due to substance abuse / abuse withdrawal  SIGNIFICANT EVENTS / STUDIES:  04/07/2013 - admit 04/07/13 - U TOX - PAN POSITIVE EXCEPT BARBITUATES  LINES / TUBES: none  CULTURES: none  ANTIBIOTICS: none  HISTORY OF PRESENT ILLNESS:  33 year old adopted male, hx of substance abuse since age 33, ADD, Anxiety/Depression. PTSd admitted with acute enceophalopathy due to withdrawal. Drinks fifth to a quarter of vodka daily (last 1 day prior to admit). U Tox positive for amphetamine marijuana, narcs, benzo, cocaine. In SDU, continued hallucinations though calm but needing repeated reassurance despite ativan every 1-2h. PCCM therefore consulted. Mom says high risk for decompensation    SUBJECTIVE:  NAD Sleeping   VITAL SIGNS: Filed Vitals:   04/09/13 0405 04/09/13 0425 04/09/13 0600 04/09/13 0800  BP:   118/79 110/68  Pulse: 99  66 74  Temp: 98.2 F (36.8 C)   98.4 F (36.9 C)  TempSrc: Oral   Oral  Resp:   20 14  Weight:  119.1 kg (262 lb 9.1 oz)    SpO2:   92% 95%      HEMODYNAMICS:   VENTILATOR SETTINGS:  INTAKE / OUTPUT: I/O last 3 completed shifts: In: 1627.9 [I.V.:1242.9; Other:385] Out: 600 [Urine:600]     PHYSICAL EXAMINATION: General:   Obese, sitting up eating Psych: No acute issue Neuro:  Oriented x 3. Moves all 4s.  HEENT:  Neck supple. No nodes Cardiovascular:  Normal heart sounds Lungs:  CTA bilaterally No distress Abdomen:  Soft, no mass Musculoskeletal:  No cyanosis, No clubbing,. No edema Skin:  intact  LABS: PULMONARY No results found for this basename: PHART, PCO2, PCO2ART, PO2, PO2ART, HCO3, TCO2, O2SAT,   in the last 168 hours  CBC  Recent Labs Lab 04/07/13 2315 04/08/13 0340 04/09/13 0315  HGB 13.7 11.9* 12.6*  HCT 41.3 36.4* 39.6  WBC 5.8 4.6 5.6  PLT 300 277 248    COAGULATION  Recent Labs Lab 04/08/13 0340  INR 0.98    CARDIAC  No results found for this basename: TROPONINI,  in the last 168 hours No results found for this basename: PROBNP,  in the last 168 hours   CHEMISTRY  Recent Labs Lab 04/07/13 2315 04/08/13 0340 04/09/13 0315  NA 137 141 140  K 3.9 3.9 4.4  CL 99 105 103  CO2 23 23 25   GLUCOSE 107* 147* 124*  BUN 17 17 11   CREATININE 0.99 1.07 0.95  CALCIUM 9.2 8.8 9.1  MG  --   --  1.8   The CrCl is unknown because both a height and weight (above a minimum accepted value) are required for this calculation.   LIVER  Recent Labs Lab 04/07/13 2315 04/08/13 0340 04/09/13 0315  AST 27 24 23   ALT 24 21 21   ALKPHOS 93 82 73  BILITOT 0.4 0.3 0.3  PROT 7.4 6.2 6.2  ALBUMIN 4.2 3.4* 3.4*  INR  --  0.98  --      INFECTIOUS No results found for this basename: LATICACIDVEN, PROCALCITON,  in the last 168 hours   ENDOCRINE CBG (last 3)  No results found for this basename: GLUCAP,  in the last 72 hours       IMAGING x48h  No results found.    ASSESSMENT / PLAN:  PULMONARY A:at risk for intubation due to encephalopathy P:   Monitor in ICU Wean precedex  CARDIOVASCULAR A: at risk for SVT P:  Monitor  RENAL A:  Normal P:   monitor  GASTROINTESTINAL A:  Normal P:   Advance diet  HEMATOLOGIC A:  Normal P:  Per TRH  INFECTIOUS A:  No evidence of infection P:   monitor  ENDOCRINE A:  Nil acute   P:   monitor  NEUROLOGIC A:  Acute encephalopathy P:   Continue precdex Intubate if unable to balance sedation with adequate airway protection  TODAY'S SUMMARY:  Remains on precedex, eating.  Brett Canales Minor ACNP [facility] PCCM Pager [phone] till 3 pm If no answer page [phone] 04/09/2013, 9:34 AM  Levy Pupa, MD, PhD 04/09/2013, 11:52 AM [facility] [phone] or if no answer [phone]

## 2013-04-09 NOTE — Progress Notes (Signed)
Paged Dr. Thedore MinsSingh to notify of CIWA score of 23.  Orders received.  Ardyth GalAnderson, Veda Arrellano Ann, RN 04/09/2013

## 2013-04-09 NOTE — Progress Notes (Signed)
Pt slept thru the night without agitation nor withdrawal symptom. Continues to be on Precedex drip, drip titrated from 1.552mcghr to 0.806mcg/hr. Pt currently in bed now resting and looking thru diet menu, deciding what to order for breakfast.

## 2013-04-10 DIAGNOSIS — F1420 Cocaine dependence, uncomplicated: Secondary | ICD-10-CM

## 2013-04-10 LAB — CBC
HEMATOCRIT: 40 % (ref 39.0–52.0)
Hemoglobin: 12.9 g/dL — ABNORMAL LOW (ref 13.0–17.0)
MCH: 29.2 pg (ref 26.0–34.0)
MCHC: 32.3 g/dL (ref 30.0–36.0)
MCV: 90.5 fL (ref 78.0–100.0)
PLATELETS: 238 10*3/uL (ref 150–400)
RBC: 4.42 MIL/uL (ref 4.22–5.81)
RDW: 13.6 % (ref 11.5–15.5)
WBC: 6.3 10*3/uL (ref 4.0–10.5)

## 2013-04-10 LAB — BASIC METABOLIC PANEL
BUN: 11 mg/dL (ref 6–23)
CO2: 23 meq/L (ref 19–32)
CREATININE: 1.02 mg/dL (ref 0.50–1.35)
Calcium: 9.4 mg/dL (ref 8.4–10.5)
Chloride: 102 mEq/L (ref 96–112)
GFR calc Af Amer: >90 mL/min (ref >90)
Glucose, Bld: 108 mg/dL — ABNORMAL HIGH (ref 70–99)
Potassium: 3.7 mEq/L (ref 3.7–5.3)
Sodium: 137 mEq/L (ref 137–147)

## 2013-04-10 LAB — MAGNESIUM: MAGNESIUM: 1.8 mg/dL (ref 1.5–2.5)

## 2013-04-10 MED ORDER — LEVOFLOXACIN 500 MG PO TABS
500.0000 mg | ORAL_TABLET | Freq: Every day | ORAL | Status: DC
  Administered 2013-04-10 – 2013-04-11 (×2): 500 mg via ORAL
  Filled 2013-04-10 (×2): qty 1

## 2013-04-10 MED ORDER — SODIUM CHLORIDE 0.9 % IV SOLN
INTRAVENOUS | Status: DC
  Administered 2013-04-09: 19:00:00 via INTRAVENOUS

## 2013-04-10 MED ORDER — POTASSIUM CHLORIDE CRYS ER 20 MEQ PO TBCR
20.0000 meq | EXTENDED_RELEASE_TABLET | Freq: Once | ORAL | Status: AC
  Administered 2013-04-10: 20 meq via ORAL
  Filled 2013-04-10: qty 1

## 2013-04-10 MED ORDER — MAGNESIUM SULFATE IN D5W 10-5 MG/ML-% IV SOLN
1.0000 g | Freq: Once | INTRAVENOUS | Status: AC
  Administered 2013-04-10: 1 g via INTRAVENOUS
  Filled 2013-04-10: qty 100

## 2013-04-10 NOTE — Progress Notes (Signed)
PULMONARY  / CRITICAL CARE MEDICINE  Name: Steve Bass MRN: [id number] DOB: 03/08/1980 PCP Terressa KoyanagiKIM, HANNAH R., DO    ADMISSION DATE:  04/07/2013  LOS 3 days   CONSULTATION DATE:  04/08/13   REFERRING MD :  Dr Lucrezia StarchGhimre PRIMARY SERVICE: TRH  CHIEF COMPLAINT:  Acute encephalopathy  BRIEF PATIENT DESCRIPTION: acute encephalopathy due to substance abuse / abuse withdrawal  SIGNIFICANT EVENTS / STUDIES:  04/07/2013 - admit 04/07/13 - U TOX - PAN POSITIVE EXCEPT BARBITUATES 3/26 remains on precedex with good control  LINES / TUBES: none  CULTURES: none  ANTIBIOTICS: none  HISTORY OF PRESENT ILLNESS:  33 year old adopted male, hx of substance abuse since age 33, ADD, Anxiety/Depression. PTSd admitted with acute enceophalopathy due to withdrawal. Drinks fifth to a quarter of vodka daily (last 1 day prior to admit). U Tox positive for amphetamine marijuana, narcs, benzo, cocaine. In SDU, continued hallucinations though calm but needing repeated reassurance despite ativan every 1-2h. PCCM therefore consulted. Mom says high risk for decompensation    SUBJECTIVE:  NAD  VITAL SIGNS: Filed Vitals:   04/10/13 0600 04/10/13 0700 04/10/13 0800 04/10/13 0900  BP:   125/70   Pulse: 67 55 67 68  Temp:   97.5 F (36.4 C)   TempSrc:   Oral   Resp: 20  17 21   Height:      Weight:      SpO2: 91% 99% 99% 97%      HEMODYNAMICS:   VENTILATOR SETTINGS:  INTAKE / OUTPUT: I/O last 3 completed shifts: In: 2589.8 [P.O.:1480; I.V.:679.8; Other:430] Out: 1600 [Urine:1600]     PHYSICAL EXAMINATION: General:   Obese, sleeping but easy to arouse Psych: No acute issue Neuro:  Oriented x 3. Moves all 4s.  HEENT:  Neck supple. No nodes Cardiovascular:  Normal heart sounds Lungs:  CTA bilaterally No distress Abdomen:  Soft, no mass Musculoskeletal:  No cyanosis, No clubbing,. No edema Skin:  intact  LABS: PULMONARY No results found for this basename: PHART, PCO2, PCO2ART,  PO2, PO2ART, HCO3, TCO2, O2SAT,  in the last 168 hours  CBC  Recent Labs Lab 04/08/13 0340 04/09/13 0315 04/10/13 0339  HGB 11.9* 12.6* 12.9*  HCT 36.4* 39.6 40.0  WBC 4.6 5.6 6.3  PLT 277 248 238    COAGULATION  Recent Labs Lab 04/08/13 0340  INR 0.98    CARDIAC  No results found for this basename: TROPONINI,  in the last 168 hours No results found for this basename: PROBNP,  in the last 168 hours   CHEMISTRY  Recent Labs Lab 04/07/13 2315 04/08/13 0340 04/09/13 0315 04/10/13 0339  NA 137 141 140 137  K 3.9 3.9 4.4 3.7  CL 99 105 103 102  CO2 23 23 25 23   GLUCOSE 107* 147* 124* 108*  BUN 17 17 11 11   CREATININE 0.99 1.07 0.95 1.02  CALCIUM 9.2 8.8 9.1 9.4  MG  --   --  1.8 1.8   Estimated Creatinine Clearance: 146.5 ml/min (by C-G formula based on Cr of 1.02).   LIVER  Recent Labs Lab 04/07/13 2315 04/08/13 0340 04/09/13 0315  AST 27 24 23   ALT 24 21 21   ALKPHOS 93 82 73  BILITOT 0.4 0.3 0.3  PROT 7.4 6.2 6.2  ALBUMIN 4.2 3.4* 3.4*  INR  --  0.98  --      INFECTIOUS No results found for this basename: LATICACIDVEN, PROCALCITON,  in the last 168 hours   ENDOCRINE CBG (  last 3)  No results found for this basename: GLUCAP,  in the last 72 hours       IMAGING x48h  No results found.    ASSESSMENT / PLAN:  PULMONARY A:at risk for intubation due to encephalopathy P:   Monitor in ICU Wean precedex over next 24-48 hours  CARDIOVASCULAR A: at risk for SVT P:  Monitor  RENAL A:  Normal P:   monitor  GASTROINTESTINAL A:  Normal P:   Advance diet  HEMATOLOGIC A:  Normal P:  Per TRH  INFECTIOUS A:  No evidence of infection P:   monitor  ENDOCRINE A:  Nil acute   P:   monitor  NEUROLOGIC A:  Acute encephalopathy P:   Continue precdex Intubate if unable to balance sedation with adequate airway protection  TODAY'S SUMMARY:  Remains on precedex, will continue for 1-2 days as needed Staff MD Note: Might  need longer  Brett Canales Minor ACNP [facility] PCCM Pager [phone] till 3 pm If no answer page [phone] 04/10/2013, 9:42 AM    STAFF NOTE: I, Dr Lavinia Sharps have personally reviewed patient's available data, including medical history, events of note, physical examination and test results as part of my evaluation. I have discussed with resident/NP and other care providers such as pharmacist, RN and RRT.  In addition,  I personally evaluated patient and elicited key findings of unable to wean off precedex due to encephalopathy. Atleast it is working. Might need more than few days on precedex to "detox".  Rest per NP/medical resident whose note is outlined above and that I agree with     Dr. Kalman Shan, M.D., [facility].C.P Pulmonary and Critical Care Medicine Staff Physician [facility] Pager: [phone], If no answer or between  15:00h - 7:00h: call [phone]  04/10/2013 11:08 AM

## 2013-04-10 NOTE — Progress Notes (Signed)
Patient c/o significant withdrawal symptoms at beginning of shift. Day shift RN had medicated patient with lorazepam IV at shift change and patient reported feeling no relief after receiving dose. Despite high CIWA scores at the time, pt was pleasant and able to communicate needs in a polite manner. MD was notified of scores and precedex gtt was initiated. Titrations made per MD order until pt's CIWA scores were within an acceptable range. As pt began experiencing alleviation of symptoms, a slow downward titration of precedex gtt was made. Pt continues to report significant relief from symptoms and has been sleeping for several hours. Will continue to titrate gtt down as patient tolerates.   VSS stable, BP improving now that pt is comfortable. Will continue to closely monitor.

## 2013-04-10 NOTE — Plan of Care (Signed)
Problem: Phase I Progression Outcomes Goal: OOB as tolerated unless otherwise ordered Outcome: Progressing Pt unsteady on his feet, requires assistance when getting out of bed.

## 2013-04-10 NOTE — Progress Notes (Signed)
PATIENT DETAILS Name: Steve MewBenjamin B Yokley Age: 33 y.o. Sex: male Date of Birth: 03/25/1980 Admit Date: 04/07/2013 Admitting Physician Lynden OxfordPranav Patel, MD PCP:KIM, Damita LackHANNAH R., DO   Subjective: In bed awake, alert x2. Is able to answer some questions- denies any headache, no chest abdominal pain or focal weakness.   Assessment/Plan:   Acute Encephalopathy -secondary to withdrawal from  ETOH, Cocaine, Marijuana,Methamphetamine, Heroin and Benzo's , stopping Precedex on 04/09/2013 resulted in extreme rebound and withdrawal, hence Precedex was restarted again with good results, continue scheduled Librium along with IV Ativan per CIWA protocol. -MVI/Thiamine/Folate, IVF for ETOH.  - Counseled to quit all. Psych on board as well.        Hemetemesis -resolved-? From retching/vomiting -c/w PPI -Hb stable -if recurs or worsens-will consult GI      Long standing hx of Anxiety -Benzos as in #1 above      Hx of Polysubstance use  -spoke with mother-patient is adopted-has been having issues with anxiety, substance abuse since 33 years of age. Has gone to multiple drug rehab centers and has had recurrent relapses. -psych consulted.Likely will need transfer to inpatient detox on discharge.      Disposition: Remain inpatient, once stable per Psych.     UTI  -placed 5 days of Levaquin on 04-10-13        DVT Prophylaxis: Prophylactic Lovenox     Code Status: Full code     Family Communication Mother at bedside by Dr Jerral RalphGhimire on 04-08-13    Procedures:  None    CONSULTS:  pulmonary/intensive care, Psych    Time spent 40 minutes-which includes 50% of the time with face-to-face with patient/ family and coordinating care related to the above assessment and plan.    MEDICATIONS: Scheduled Meds: . chlordiazePOXIDE  25 mg Oral TID  . enoxaparin (LOVENOX) injection  40 mg Subcutaneous Q24H  . folic acid  1 mg Oral Daily  . levofloxacin  500  mg Oral Daily  . magnesium sulfate 1 - 4 g bolus IVPB  1 g Intravenous Once  . multivitamin with minerals  1 tablet Oral Daily  . nicotine  21 mg Transdermal Daily  . pantoprazole  40 mg Oral BID  . potassium chloride  20 mEq Oral Once  . sodium chloride  3 mL Intravenous Q12H  . thiamine  100 mg Oral Daily   Continuous Infusions: . sodium chloride 20 mL/hr at 04/10/13 0600  . dexmedetomidine 0.6 mcg/kg/hr (04/10/13 0855)   PRN Meds:.dicyclomine, hydrOXYzine, loperamide, ondansetron (ZOFRAN) IV, ondansetron  Antibiotics: Anti-infectives   Start     Dose/Rate Route Frequency Ordered Stop   04/10/13 0800  levofloxacin (LEVAQUIN) tablet 500 mg     500 mg Oral Daily 04/10/13 0700 04/15/13 0959       PHYSICAL EXAM: Vital signs in last 24 hours: Filed Vitals:   04/10/13 0600 04/10/13 0700 04/10/13 0800 04/10/13 0900  BP:   125/70   Pulse: 67 55 67 68  Temp:   97.5 F (36.4 C)   TempSrc:   Oral   Resp: 20  17 21   Height:      Weight:      SpO2: 91% 99% 99% 97%    Weight change: 0 kg (0 lb) Filed Weights   04/09/13 0425 04/09/13 1129 04/10/13 0335  Weight: 119.1 kg (262 lb 9.1 oz) 117.6 kg (259 lb 4.2 oz) 119.9 kg (264 lb 5.3 oz)   Body mass index is  33.04 kg/(m^2).   Gen Exam: NAD, with clear speech.   Neck: Supple, No JVD.   Chest: B/L Clear.   CVS: S1 S2 Regular, no murmurs.  Abdomen: soft, BS +, non tender, non distended.  Extremities: no edema, lower extremities warm to touch. Neurologic: Non Focal.  Skin: No Rash.   Wounds: N/A.    Intake/Output from previous day:  Intake/Output Summary (Last 24 hours) at 04/10/13 0930 Last data filed at 04/10/13 0900  Gross per 24 hour  Intake 1836.45 ml  Output   1000 ml  Net 836.45 ml     LAB RESULTS: CBC  Recent Labs Lab 04/07/13 2315 04/08/13 0340 04/09/13 0315 04/10/13 0339  WBC 5.8 4.6 5.6 6.3  HGB 13.7 11.9* 12.6* 12.9*  HCT 41.3 36.4* 39.6 40.0  PLT 300 277 248 238  MCV 90.2 89.9 92.5 90.5  MCH  29.9 29.4 29.4 29.2  MCHC 33.2 32.7 31.8 32.3  RDW 13.6 13.5 13.9 13.6  LYMPHSABS 2.2  --   --   --   MONOABS 0.6  --   --   --   EOSABS 0.1  --   --   --   BASOSABS 0.0  --   --   --     Chemistries   Recent Labs Lab 04/07/13 2315 04/08/13 0340 04/09/13 0315 04/10/13 0339  NA 137 141 140 137  K 3.9 3.9 4.4 3.7  CL 99 105 103 102  CO2 23 23 25 23   GLUCOSE 107* 147* 124* 108*  BUN 17 17 11 11   CREATININE 0.99 1.07 0.95 1.02  CALCIUM 9.2 8.8 9.1 9.4  MG  --   --  1.8 1.8    CBG: No results found for this basename: GLUCAP,  in the last 168 hours  GFR Estimated Creatinine Clearance: 146.5 ml/min (by C-G formula based on Cr of 1.02).  Coagulation profile  Recent Labs Lab 04/08/13 0340  INR 0.98    Cardiac Enzymes No results found for this basename: CK, CKMB, TROPONINI, MYOGLOBIN,  in the last 168 hours  No components found with this basename: POCBNP,  No results found for this basename: DDIMER,  in the last 72 hours No results found for this basename: HGBA1C,  in the last 72 hours No results found for this basename: CHOL, HDL, LDLCALC, TRIG, CHOLHDL, LDLDIRECT,  in the last 72 hours No results found for this basename: TSH, T4TOTAL, FREET3, T3FREE, THYROIDAB,  in the last 72 hours No results found for this basename: VITAMINB12, FOLATE, FERRITIN, TIBC, IRON, RETICCTPCT,  in the last 72 hours No results found for this basename: LIPASE, AMYLASE,  in the last 72 hours  Urine Studies No results found for this basename: UACOL, UAPR, USPG, UPH, UTP, UGL, UKET, UBIL, UHGB, UNIT, UROB, ULEU, UEPI, UWBC, URBC, UBAC, CAST, CRYS, UCOM, BILUA,  in the last 72 hours  MICROBIOLOGY: Recent Results (from the past 240 hour(s))  URINE CULTURE     Status: None   Collection Time    04/07/13 11:28 PM      Result Value Ref Range Status   Specimen Description URINE, CATHETERIZED   Final   Special Requests NONE   Final   Culture  Setup Time     Final   Value: 04/08/2013 09:51      Performed at [facility] Count     Final   Value: >=100,000 COLONIES/ML     Performed at [facility]  Final   Value: VIRIDANS STREPTOCOCCUS     Performed at [facility]   Report Status 04/09/2013 FINAL   Final  MRSA PCR SCREENING     Status: None   Collection Time    04/08/13  2:33 AM      Result Value Ref Range Status   MRSA by PCR NEGATIVE  NEGATIVE Final   Comment:            The GeneXpert MRSA Assay (FDA     approved for NASAL specimens     only), is one component of a     comprehensive MRSA colonization     surveillance program. It is not     intended to diagnose MRSA     infection nor to guide or     monitor treatment for     MRSA infections.    RADIOLOGY STUDIES/RESULTS: No results found.  Leroy Sea, MD  [facility] Pager:[phone]  If 7PM-7AM, please contact night-coverage [email or web address] Password [email or web address] 04/10/2013, 9:30 AM   LOS: 3 days

## 2013-04-10 NOTE — Progress Notes (Signed)
Clinical Social Work  CSW met with patient and mom at bedside. Patient reports he is feeling somewhat better but has been thinking about treatment plans. Patient reports he does not want to go to [facility] because he will complete detox here. Patient reports that he plans to DC home and wants to go to [facility] and complete a Suboxone clinic. Patient was on Suboxone through Dr. Starleen Arms office back in November and wants to continue treatment. Patient inquired if he could do [facility] while on Suboxone. CSW spoke with Lelon Frohlich through [facility] who reports that patient has been to [facility] in the past and would recommend [facility]. CSW will gather resources and provide all options for patient.  [place], [place] [phone]

## 2013-04-11 LAB — BASIC METABOLIC PANEL
BUN: 13 mg/dL (ref 6–23)
CALCIUM: 9.2 mg/dL (ref 8.4–10.5)
CO2: 25 mEq/L (ref 19–32)
Chloride: 104 mEq/L (ref 96–112)
Creatinine, Ser: 1.04 mg/dL (ref 0.50–1.35)
GFR calc non Af Amer: >90 mL/min (ref >90)
Glucose, Bld: 99 mg/dL (ref 70–99)
POTASSIUM: 3.7 meq/L (ref 3.7–5.3)
Sodium: 141 mEq/L (ref 137–147)

## 2013-04-11 LAB — MAGNESIUM: Magnesium: 1.8 mg/dL (ref 1.5–2.5)

## 2013-04-11 MED ORDER — LORAZEPAM 2 MG/ML IJ SOLN: 2.0000 mg | INTRAMUSCULAR | Status: DC

## 2013-04-11 MED ORDER — DEXMEDETOMIDINE HCL IN NACL 400 MCG/100ML IV SOLN
0.4000 ug/kg/h | INTRAVENOUS | Status: DC
  Administered 2013-04-11: 0.5 ug/kg/h via INTRAVENOUS
  Filled 2013-04-11: qty 100

## 2013-04-11 MED ORDER — LORAZEPAM 2 MG/ML IJ SOLN: 1.0000 mg | Freq: Four times a day (QID) | INTRAMUSCULAR | Status: DC | PRN

## 2013-04-11 MED ORDER — LORAZEPAM 1 MG PO TABS: 1.0000 mg | ORAL_TABLET | Freq: Four times a day (QID) | ORAL | Status: DC | PRN

## 2013-04-11 MED ORDER — LORAZEPAM 1 MG PO TABS: 1.0000 mg | ORAL_TABLET | ORAL | Status: DC

## 2013-04-11 NOTE — Progress Notes (Signed)
Clinical Social Work  CSW met with patient alone at bedside. Patient reports he is feeling anxious and wants to leave AMA. CSW spoke about the benefits of staying in the hospital such as getting medical attention and ensuring he is well enough to DC. CSW and patient spoke about coping skills and CSW encouraged patient to try to redirect his thoughts. Patient reports he will stay and wanted to discuss treatment options. Patient reports he does not want to go to [facility] [place] spoke with psych MD (Dr. Adele Schilder) who reports it was offered as a voluntary service so if patient is not agreeable he does not have to go to [facility]. CSW explained this information to patient and he wants to follow up on outpatient basis. CSW provided resources including [facility], [facility] [place], [facility] and [facility]. Patient reports he is leaning towards [facility] because he is interested in Suboxone treatment as well. Patient's mom wanted to discuss plans as well so when she arrived CSW met with patient and mom at bedside. Mom trying to encourage patient to go to [facility] but is agreeable to [facility] and to assist patient with scheduling an appointment. CSW explained that CSW will continue to follow but patient and mom have CSW contact information if needed.  [place], [place] [phone]

## 2013-04-11 NOTE — Progress Notes (Signed)
PATIENT DETAILS Name: Steve Bass Age: 33 y.o. Sex: male Date of Birth: 06/21/1980 Admit Date: 04/07/2013 Admitting Physician Lynden Oxford, MD PCP:KIM, Damita Lack., DO   Subjective: In bed awake, alert x 3. Is able to answer some questions- denies any headache, no chest abdominal pain or focal weakness.   Assessment/Plan:   Acute Encephalopathy  - secondary to withdrawal from  ETOH, Cocaine, Marijuana,Methamphetamine, Heroin and Benzo's , stopped Precedex on 04/11/2013 remains stable, continue scheduled Librium along with IV Ativan,MVI/Thiamine/Folate per CIWA protocol.  - Counseled to quit all. Psych on board as well.        Hemetemesis -resolved-? From retching/vomiting -c/w PPI -Hb stable -if recurs or worsens-will consult GI      Long standing hx of Anxiety -Benzos as in #1 above      Hx of Polysubstance use  -spoke with mother-patient is adopted-has been having issues with anxiety, substance abuse since 33 years of age. Has gone to multiple drug rehab centers and has had recurrent relapses. Psych consulted. Recommendation was behavioral health however patient chooses to go home with outpatient detox and psych followup, mother informed in detail on 04/11/2013. She is going to talk to her son one more time and let the social worker know about their plans.     UTI  -placed 5 days of Levaquin on 04-10-13     Disposition: Remain inpatient, likely discharge tomorrow            DVT Prophylaxis: Prophylactic Lovenox     Code Status: Full code     Family Communication Mother at bedside by Dr Jerral Ralph on 04-08-13    Procedures:  None    CONSULTS:  pulmonary/intensive care, Psych    Time spent 40 minutes-which includes 50% of the time with face-to-face with patient/ family and coordinating care related to the above assessment and plan.    MEDICATIONS: Scheduled Meds: . chlordiazePOXIDE  25 mg Oral TID  .  enoxaparin (LOVENOX) injection  40 mg Subcutaneous Q24H  . folic acid  1 mg Oral Daily  . levofloxacin  500 mg Oral Daily  . multivitamin with minerals  1 tablet Oral Daily  . nicotine  21 mg Transdermal Daily  . pantoprazole  40 mg Oral BID  . sodium chloride  3 mL Intravenous Q12H  . thiamine  100 mg Oral Daily   Continuous Infusions: . sodium chloride 20 mL/hr at 04/10/13 0600   PRN Meds:.dicyclomine, hydrOXYzine, loperamide, ondansetron (ZOFRAN) IV, ondansetron  Antibiotics: Anti-infectives   Start     Dose/Rate Route Frequency Ordered Stop   04/10/13 0800  levofloxacin (LEVAQUIN) tablet 500 mg     500 mg Oral Daily 04/10/13 0700 04/15/13 0959       PHYSICAL EXAM: Vital signs in last 24 hours: Filed Vitals:   04/11/13 0000 04/11/13 0200 04/11/13 0400 04/11/13 0800  BP: 128/83 122/78 104/62 145/98  Pulse: 51 52 60 63  Temp: 98 F (36.7 C)  98.3 F (36.8 C) 97.4 F (36.3 C)  TempSrc: Oral  Oral Oral  Resp: 10 18 17 12   Height:      Weight:      SpO2: 94% 97% 96% 100%    Weight change:  Filed Weights   04/09/13 0425 04/09/13 1129 04/10/13 0335  Weight: 119.1 kg (262 lb 9.1 oz) 117.6 kg (259 lb 4.2 oz) 119.9 kg (264 lb 5.3 oz)   Body mass index is 33.04 kg/(m^2).   Gen  Exam: NAD, with clear speech.   Neck: Supple, No JVD.   Chest: B/L Clear.   CVS: S1 S2 Regular, no murmurs.  Abdomen: soft, BS +, non tender, non distended.  Extremities: no edema, lower extremities warm to touch. Neurologic: Non Focal.  Skin: No Rash.   Wounds: N/A.    Intake/Output from previous day:  Intake/Output Summary (Last 24 hours) at 04/11/13 1027 Last data filed at 04/11/13 0800  Gross per 24 hour  Intake 1150.82 ml  Output   1550 ml  Net -399.18 ml     LAB RESULTS: CBC  Recent Labs Lab 04/07/13 2315 04/08/13 0340 04/09/13 0315 04/10/13 0339  WBC 5.8 4.6 5.6 6.3  HGB 13.7 11.9* 12.6* 12.9*  HCT 41.3 36.4* 39.6 40.0  PLT 300 277 248 238  MCV 90.2 89.9 92.5 90.5    MCH 29.9 29.4 29.4 29.2  MCHC 33.2 32.7 31.8 32.3  RDW 13.6 13.5 13.9 13.6  LYMPHSABS 2.2  --   --   --   MONOABS 0.6  --   --   --   EOSABS 0.1  --   --   --   BASOSABS 0.0  --   --   --     Chemistries   Recent Labs Lab 04/07/13 2315 04/08/13 0340 04/09/13 0315 04/10/13 0339 04/11/13 0313  NA 137 141 140 137 141  K 3.9 3.9 4.4 3.7 3.7  CL 99 105 103 102 104  CO2 23 23 25 23 25   GLUCOSE 107* 147* 124* 108* 99  BUN 17 17 11 11 13   CREATININE 0.99 1.07 0.95 1.02 1.04  CALCIUM 9.2 8.8 9.1 9.4 9.2  MG  --   --  1.8 1.8 1.8    CBG: No results found for this basename: GLUCAP,  in the last 168 hours  GFR Estimated Creatinine Clearance: 143.7 ml/min (by C-G formula based on Cr of 1.04).  Coagulation profile  Recent Labs Lab 04/08/13 0340  INR 0.98    Cardiac Enzymes No results found for this basename: CK, CKMB, TROPONINI, MYOGLOBIN,  in the last 168 hours  No components found with this basename: POCBNP,  No results found for this basename: DDIMER,  in the last 72 hours No results found for this basename: HGBA1C,  in the last 72 hours No results found for this basename: CHOL, HDL, LDLCALC, TRIG, CHOLHDL, LDLDIRECT,  in the last 72 hours No results found for this basename: TSH, T4TOTAL, FREET3, T3FREE, THYROIDAB,  in the last 72 hours No results found for this basename: VITAMINB12, FOLATE, FERRITIN, TIBC, IRON, RETICCTPCT,  in the last 72 hours No results found for this basename: LIPASE, AMYLASE,  in the last 72 hours  Urine Studies No results found for this basename: UACOL, UAPR, USPG, UPH, UTP, UGL, UKET, UBIL, UHGB, UNIT, UROB, ULEU, UEPI, UWBC, URBC, UBAC, CAST, CRYS, UCOM, BILUA,  in the last 72 hours  MICROBIOLOGY: Recent Results (from the past 240 hour(s))  URINE CULTURE     Status: None   Collection Time    04/07/13 11:28 PM      Result Value Ref Range Status   Specimen Description URINE, CATHETERIZED   Final   Special Requests NONE   Final   Culture   Setup Time     Final   Value: 04/08/2013 09:51     Performed at [facility]     Final   Value: >=100,000 COLONIES/ML     Performed at  [facility]   Culture     Final   Value: VIRIDANS STREPTOCOCCUS     Performed at [facility]   Report Status 04/09/2013 FINAL   Final  MRSA PCR SCREENING     Status: None   Collection Time    04/08/13  2:33 AM      Result Value Ref Range Status   MRSA by PCR NEGATIVE  NEGATIVE Final   Comment:            The GeneXpert MRSA Assay (FDA     approved for NASAL specimens     only), is one component of a     comprehensive MRSA colonization     surveillance program. It is not     intended to diagnose MRSA     infection nor to guide or     monitor treatment for     MRSA infections.    RADIOLOGY STUDIES/RESULTS: No results found.  Leroy SeaSINGH,Lynel Forester K, MD  [facility] Pager:[phone]  If 7PM-7AM, please contact night-coverage [email or web address] Password [email or web address] 04/11/2013, 10:27 AM   LOS: 4 days

## 2013-04-11 NOTE — Progress Notes (Signed)
Patient at this time expresses desire to leave the Hospital immidiately, patient has been warned that this is not Medically advisable at this time, and can result in Medical complications like Death and Disability, patient understands and accepts the risks involved and assumes full responsibilty of this decision.  Patient and mother both explained the risks in detail, mother bedside agrees with patient's decision. Says he does it from time to time and she now wants him to be responsible for his actions. He is not suicidal or homicidal at this time. He promises is going to get outpatient Deaddiction helped.

## 2013-04-11 NOTE — Discharge Summary (Addendum)
Patient at this time expresses desire to leave the Hospital immidiately, patient has been warned that this is not Medically advisable at this time, and can result in Medical complications like Death and Disability, patient understands and accepts the risks involved and assumes full responsibilty of this decision.   Patient and Steve Bass both explained the risks in detail, Steve Bass bedside agrees with patient's decision. Says he does it from time to time and she now wants him to be responsible for his actions. He is not suicidal or homicidal at this time. He promises is going to get outpatient Deaddiction help.    Last Note    PATIENT DETAILS  Name: Steve Bass  Age: 33 y.o.  Sex: male  Date of Birth: 02/23/1980  Admit Date: 04/07/2013  Admitting Physician Lynden OxfordPranav Patel, MD  PCP:KIM, Damita LackHANNAH R., DO  Subjective:  In bed awake, alert x 3. Is able to answer some questions- denies any headache, no chest abdominal pain or focal weakness.  Assessment/Plan:  Acute Encephalopathy  - secondary to withdrawal from ETOH, Cocaine, Marijuana,Methamphetamine, Heroin and Benzo's , stopped Precedex on 04/11/2013 remains stable, continue scheduled Librium along with IV Ativan,MVI/Thiamine/Folate per CIWA protocol.  - Counseled to quit all. Psych on board as well.  Hemetemesis  -resolved-? From retching/vomiting  -c/w PPI  -Hb stable  -if recurs or worsens-will consult GI  Long standing hx of Anxiety  -Benzos as in #1 above  Hx of Polysubstance use  -spoke with Steve Bass-patient is adopted-has been having issues with anxiety, substance abuse since 33 years of age. Has gone to multiple drug rehab centers and has had recurrent relapses. Psych consulted. Recommendation was behavioral health however patient chooses to go home with outpatient detox and psych followup, Steve Bass informed in detail on 04/11/2013. She is going to talk to her son one more time and let the social worker know about their plans.  UTI   -placed 5 days of Levaquin on 04-10-13  Disposition:  Remain inpatient, likely discharge tomorrow  DVT Prophylaxis:  Prophylactic Lovenox  Code Status:  Full code  Family Communication  Steve Bass at bedside by Dr Jerral RalphGhimire on 04-08-13  Procedures:  None CONSULTS:  pulmonary/intensive care, Psych  Time spent  40 minutes-which includes 50% of the time with face-to-face with patient/ family and coordinating care related to the above assessment and plan.  MEDICATIONS:  Scheduled Meds:  .  chlordiazePOXIDE  25 mg  Oral  TID   .  enoxaparin (LOVENOX) injection  40 mg  Subcutaneous  Q24H   .  folic acid  1 mg  Oral  Daily   .  levofloxacin  500 mg  Oral  Daily   .  multivitamin with minerals  1 tablet  Oral  Daily   .  nicotine  21 mg  Transdermal  Daily   .  pantoprazole  40 mg  Oral  BID   .  sodium chloride  3 mL  Intravenous  Q12H   .  thiamine  100 mg  Oral  Daily    Continuous Infusions:  .  sodium chloride  20 mL/hr at 04/10/13 0600    PRN Meds:.dicyclomine, hydrOXYzine, loperamide, ondansetron (ZOFRAN) IV, ondansetron  Antibiotics:  Anti-infectives    Start    Dose/Rate  Route  Frequency  Ordered  Stop    04/10/13 0800   levofloxacin (LEVAQUIN) tablet 500 mg  500 mg  Oral  Daily  04/10/13 0700  04/15/13 0959      PHYSICAL EXAM:  Vital signs in last 24 hours:  Filed Vitals:    04/11/13 0000  04/11/13 0200  04/11/13 0400  04/11/13 0800   BP:  128/83  122/78  104/62  145/98   Pulse:  51  52  60  63   Temp:  98 F (36.7 C)   98.3 F (36.8 C)  97.4 F (36.3 C)   TempSrc:  Oral   Oral  Oral   Resp:  10  18  17  12    Height:       Weight:       SpO2:  94%  97%  96%  100%    Weight change:  Filed Weights    04/09/13 0425  04/09/13 1129  04/10/13 0335   Weight:  119.1 kg (262 lb 9.1 oz)  117.6 kg (259 lb 4.2 oz)  119.9 kg (264 lb 5.3 oz)    Body mass index is 33.04 kg/(m^2).  Gen Exam: NAD, with clear speech.  Neck: Supple, No JVD.  Chest: B/L Clear.  CVS: S1 S2  Regular, no murmurs.  Abdomen: soft, BS +, non tender, non distended.  Extremities: no edema, lower extremities warm to touch.  Neurologic: Non Focal.  Skin: No Rash.  Wounds: N/A.  Intake/Output from previous day:   Intake/Output Summary (Last 24 hours) at 04/11/13 1027 Last data filed at 04/11/13 0800   Gross per 24 hour   Intake  1150.82 ml   Output  1550 ml   Net  -399.18 ml    LAB RESULTS:  CBC   Recent Labs  Lab  04/07/13 2315  04/08/13 0340  04/09/13 0315  04/10/13 0339   WBC  5.8  4.6  5.6  6.3   HGB  13.7  11.9*  12.6*  12.9*   HCT  41.3  36.4*  39.6  40.0   PLT  300  277  248  238   MCV  90.2  89.9  92.5  90.5   MCH  29.9  29.4  29.4  29.2   MCHC  33.2  32.7  31.8  32.3   RDW  13.6  13.5  13.9  13.6   LYMPHSABS  2.2  --  --  --   MONOABS  0.6  --  --  --   EOSABS  0.1  --  --  --   BASOSABS  0.0  --  --  --    Chemistries   Recent Labs  Lab  04/07/13 2315  04/08/13 0340  04/09/13 0315  04/10/13 0339  04/11/13 0313   NA  137  141  140  137  141   K  3.9  3.9  4.4  3.7  3.7   CL  99  105  103  102  104   CO2  23  23  25  23  25    GLUCOSE  107*  147*  124*  108*  99   BUN  17  17  11  11  13    CREATININE  0.99  1.07  0.95  1.02  1.04   CALCIUM  9.2  8.8  9.1  9.4  9.2   MG  --  --  1.8  1.8  1.8    CBG:  No results found for this basename: GLUCAP, in the last 168 hours  GFR  Estimated Creatinine Clearance: 143.7 ml/min (by C-G formula based on Cr of 1.04).  Coagulation profile   Recent Labs  Lab  04/08/13 0340   INR  0.98    Cardiac Enzymes  No results found for this basename: CK, CKMB, TROPONINI, MYOGLOBIN, in the last 168 hours  No components found with this basename: POCBNP,  No results found for this basename: DDIMER, in the last 72 hours  No results found for this basename: HGBA1C, in the last 72 hours  No results found for this basename: CHOL, HDL, LDLCALC, TRIG, CHOLHDL, LDLDIRECT, in the last 72 hours  No results found for this  basename: TSH, T4TOTAL, FREET3, T3FREE, THYROIDAB, in the last 72 hours  No results found for this basename: VITAMINB12, FOLATE, FERRITIN, TIBC, IRON, RETICCTPCT, in the last 72 hours  No results found for this basename: LIPASE, AMYLASE, in the last 72 hours  Urine Studies  No results found for this basename: UACOL, UAPR, USPG, UPH, UTP, UGL, UKET, UBIL, UHGB, UNIT, UROB, ULEU, UEPI, UWBC, URBC, UBAC, CAST, CRYS, UCOM, BILUA, in the last 72 hours  MICROBIOLOGY:  Recent Results (from the past 240 hour(s))   URINE CULTURE Status: None    Collection Time    04/07/13 11:28 PM   Result  Value  Ref Range  Status    Specimen Description  URINE, CATHETERIZED   Final    Special Requests  NONE   Final    Culture Setup Time    Final    Value:  04/08/2013 09:51     Performed at [facility]    Final    Value:  >=100,000 COLONIES/ML     Performed at [facility]    Culture    Final    Value:  VIRIDANS STREPTOCOCCUS     Performed at [facility]    Report Status  04/09/2013 FINAL   Final   MRSA PCR SCREENING Status: None    Collection Time    04/08/13 2:33 AM   Result  Value  Ref Range  Status    MRSA by PCR  NEGATIVE  NEGATIVE  Final    Comment:      The GeneXpert MRSA Assay (FDA     approved for NASAL specimens     only), is one component of a     comprehensive MRSA colonization     surveillance program. It is not     intended to diagnose MRSA     infection nor to guide or     monitor treatment for     MRSA infections.    RADIOLOGY STUDIES/RESULTS:  No results found.  Leroy Sea, MD  [facility]  Pager:[phone]  If 7PM-7AM, please contact night-coverage  [email or web address]  Password [facility]  04/11/2013, 10:27 AM  LOS: 4 days

## 2013-04-11 NOTE — Progress Notes (Signed)
PULMONARY  / CRITICAL CARE MEDICINE  Name: Steve MewBenjamin B Roes MRN: [id number] DOB: 06/20/1980 PCP Terressa KoyanagiKIM, HANNAH R., DO    ADMISSION DATE:  04/07/2013  LOS 4 days   CONSULTATION DATE:  04/08/13   REFERRING MD :  Dr Lucrezia StarchGhimre PRIMARY SERVICE: [facility]  CHIEF COMPLAINT:  Acute encephalopathy  BRIEF PATIENT DESCRIPTION: acute encephalopathy due to substance abuse / abuse withdrawal  SIGNIFICANT EVENTS / STUDIES:  04/07/2013 - admit 04/07/13 - U TOX - PAN POSITIVE EXCEPT BARBITUATES 3/26 remains on precedex with good control 3/27 off precedex and NAD LINES / TUBES: none  CULTURES: none  ANTIBIOTICS: none  HISTORY OF PRESENT ILLNESS:  33 year old adopted male, hx of substance abuse since age 33, ADD, Anxiety/Depression. PTSd admitted with acute enceophalopathy due to withdrawal. Drinks fifth to a quarter of vodka daily (last 1 day prior to admit). U Tox positive for amphetamine marijuana, narcs, benzo, cocaine. In SDU, continued hallucinations though calm but needing repeated reassurance despite ativan every 1-2h. PCCM therefore consulted. Mom says high risk for decompensation    SUBJECTIVE:  NAD, off precedex  VITAL SIGNS: Filed Vitals:   04/11/13 0000 04/11/13 0200 04/11/13 0400 04/11/13 0800  BP: 128/83 122/78 104/62 145/98  Pulse: 51 52 60 63  Temp: 98 F (36.7 C)  98.3 F (36.8 C) 97.4 F (36.3 C)  TempSrc: Oral  Oral Oral  Resp: 10 18 17 12   Height:      Weight:      SpO2: 94% 97% 96% 100%      HEMODYNAMICS:   VENTILATOR SETTINGS:  INTAKE / OUTPUT: I/O last 3 completed shifts: In: 2177.3 [P.O.:980; I.V.:1097.3; IV Piggyback:100] Out: 2550 [Urine:2550]     PHYSICAL EXAMINATION: General:  Awake and alert Psych: No acute issue Neuro:  Oriented x 3. Moves all 4s.  HEENT:  Neck supple. No nodes Cardiovascular:  Normal heart sounds Lungs:  CTA bilaterally No distress Abdomen:  Soft, no mass Musculoskeletal:  No cyanosis, No clubbing,. No edema Skin:   intact  LABS: PULMONARY No results found for this basename: PHART, PCO2, PCO2ART, PO2, PO2ART, HCO3, TCO2, O2SAT,  in the last 168 hours  CBC  Recent Labs Lab 04/08/13 0340 04/09/13 0315 04/10/13 0339  HGB 11.9* 12.6* 12.9*  HCT 36.4* 39.6 40.0  WBC 4.6 5.6 6.3  PLT 277 248 238    COAGULATION  Recent Labs Lab 04/08/13 0340  INR 0.98    CARDIAC  No results found for this basename: TROPONINI,  in the last 168 hours No results found for this basename: PROBNP,  in the last 168 hours   CHEMISTRY  Recent Labs Lab 04/07/13 2315 04/08/13 0340 04/09/13 0315 04/10/13 0339 04/11/13 0313  NA 137 141 140 137 141  K 3.9 3.9 4.4 3.7 3.7  CL 99 105 103 102 104  CO2 23 23 25 23 25   GLUCOSE 107* 147* 124* 108* 99  BUN 17 17 11 11 13   CREATININE 0.99 1.07 0.95 1.02 1.04  CALCIUM 9.2 8.8 9.1 9.4 9.2  MG  --   --  1.8 1.8 1.8   Estimated Creatinine Clearance: 143.7 ml/min (by C-G formula based on Cr of 1.04).   LIVER  Recent Labs Lab 04/07/13 2315 04/08/13 0340 04/09/13 0315  AST 27 24 23   ALT 24 21 21   ALKPHOS 93 82 73  BILITOT 0.4 0.3 0.3  PROT 7.4 6.2 6.2  ALBUMIN 4.2 3.4* 3.4*  INR  --  0.98  --  INFECTIOUS No results found for this basename: LATICACIDVEN, PROCALCITON,  in the last 168 hours   ENDOCRINE CBG (last 3)  No results found for this basename: GLUCAP,  in the last 72 hours       IMAGING x48h  No results found.    ASSESSMENT / PLAN:  PULMONARY A:at risk for intubation due to encephalopathy(resolved) P:   Monitor in ICU precedex off  CARDIOVASCULAR A: at risk for SVT P:  Monitor  RENAL A:  Normal P:   monitor  GASTROINTESTINAL A:  Normal P:   Advance diet  HEMATOLOGIC A:  Normal P:  Per [facility]  INFECTIOUS A:  No evidence of infection P:   monitor  ENDOCRINE A:  Nil acute   P:   monitor  NEUROLOGIC A:  Acute encephalopathy(resolved) P:   Monitor off  precdex Intubate if unable to balance sedation  with adequate airway protection  TODAY'S SUMMARY:  Off precedex, ok to transfer out of unit PCCM will sign off.  Brett Canales Minor ACNP Adolph Pollack PCCM Pager [phone] till 3 pm If no answer page [phone] 04/11/2013, 9:54 AM   STAFF NOTE: I, Dr Lavinia Sharps have personally reviewed patient's available data, including medical history, events of note, physical examination and test results as part of my evaluation. I have discussed with resident/NP and other care providers such as pharmacist, RN and RRT.  In addition,  I personally evaluated patient and elicited key findings of acute encephalopahty due to substance abuse/detx. He is off precedex and is walking around in room, ? Threatening AMA. Will sign off  Rest per NP/medical resident whose note is outlined above and that I agree with     Dr. Kalman Shan, M.D., [facility].C.P Pulmonary and Critical Care Medicine Staff Physician [facility] Pager: [phone], If no answer or between  15:00h - 7:00h: call [phone]  04/11/2013 10:34 AM

## 2013-05-28 ENCOUNTER — Telehealth: Payer: Self-pay

## 2013-05-28 DIAGNOSIS — E291 Testicular hypofunction: Secondary | ICD-10-CM | POA: Insufficient documentation

## 2013-05-28 NOTE — Telephone Encounter (Signed)
Message copied by Annett FabianJONES, Jannat Rosemeyer L on Wed May 28, 2013  9:54 AM ------      Message from: Claudette HeadSTARK, MALCOLM T      Created: Wed May 28, 2013  9:40 AM       Since his LFTs were normal in March we can discontinue following his LFTs.                         ----- Message -----         From: Rossie MuskratSheri Lynn Kadince Boxley, RN         Sent: 05/28/2013   9:36 AM           To: Meryl DareMalcolm T Stark, MD            Dr. Russella DarStark patient had labs recently in March and LFTs were normal.  Looks like he was also admitted recently for substance abuse.  Do you want me to contact him about coming for repeat labs?  You wanted a one year lab follow up for LFTs.        ----- Message -----         From: Rossie MuskratSheri Lynn Seanmichael Salmons, RN         Sent: 05/28/2013           To: Rossie MuskratSheri Lynn Marteze Vecchio, RN            Needs lft- see labs from 05/27/12.  Orders are in             ------

## 2013-08-12 ENCOUNTER — Other Ambulatory Visit: Payer: Self-pay | Admitting: Gastroenterology

## 2013-08-12 NOTE — Telephone Encounter (Signed)
NEEDS OFFICE VISIT.

## 2013-09-09 ENCOUNTER — Other Ambulatory Visit: Payer: Self-pay | Admitting: Gastroenterology

## 2013-09-10 ENCOUNTER — Telehealth: Payer: Self-pay | Admitting: Gastroenterology

## 2013-09-10 MED ORDER — ESOMEPRAZOLE MAGNESIUM 40 MG PO CPDR: DELAYED_RELEASE_CAPSULE | ORAL | Status: DC

## 2013-09-10 NOTE — Telephone Encounter (Signed)
Sent one refill to patient's pharmacy until scheduled appt on 09/24/13.

## 2013-09-24 ENCOUNTER — Ambulatory Visit (INDEPENDENT_AMBULATORY_CARE_PROVIDER_SITE_OTHER): Payer: BC Managed Care – PPO | Admitting: Gastroenterology

## 2013-09-24 ENCOUNTER — Encounter: Payer: Self-pay | Admitting: Gastroenterology

## 2013-09-24 VITALS — BP 130/78 | HR 88 | Ht 69.75 in | Wt 263.0 lb

## 2013-09-24 DIAGNOSIS — K2100 Gastro-esophageal reflux disease with esophagitis, without bleeding: Secondary | ICD-10-CM

## 2013-09-24 MED ORDER — ESOMEPRAZOLE MAGNESIUM 40 MG PO CPDR: DELAYED_RELEASE_CAPSULE | ORAL | Status: DC

## 2013-09-24 NOTE — Patient Instructions (Signed)
We have sent the following medications to your pharmacy for you to pick up at your convenience:Nexium.   Patient advised to avoid spicy, acidic, citrus, chocolate, mints, fruit and fruit juices.  Limit the intake of caffeine, alcohol and Soda.  Don't exercise too soon after eating.  Don't lie down within 3-4 hours of eating.  Elevate the head of your bed.  Thank you for choosing me and [facility].  Venita Lick. Pleas Koch., MD., Clementeen Graham  cc: Joette Catching, MD

## 2013-09-24 NOTE — Progress Notes (Signed)
    History of Present Illness: This is a 33 year old male returning for followup of GERD with a history of erosive esophagitis. He states since discontinuing alcohol usage and avoiding other dietary stressors his reflux symptoms come under excellent control. He states he has intentionally lost about 60 pounds.  Current Medications, Allergies, Past Medical History, Past Surgical History, Family History and Social History were reviewed in [employer] record.  Physical Exam: General: Well developed , well nourished, no acute distress Head: Normocephalic and atraumatic Eyes:  sclerae anicteric, EOMI Ears: Normal auditory acuity Mouth: No deformity or lesions Lungs: Clear throughout to auscultation Heart: Regular rate and rhythm; no murmurs, rubs or bruits Abdomen: Soft, non tender and non distended. No masses, hepatosplenomegaly or hernias noted. Normal Bowel sounds Musculoskeletal: Symmetrical with no gross deformities  Pulses:  Normal pulses noted Extremities: No clubbing, cyanosis, edema or deformities noted Neurological: Alert oriented x 4, grossly nonfocal Psychological:  Alert and cooperative. Normal mood and affect  Assessment and Recommendations:  1. GERD with a history of erosive esophagitis. Continue standard antireflux measures and Nexium 40 mg daily. Avoid alcohol and other stressor foods.  2. Obesity. Substantial intentional weight loss. Encouraged him to continue with his ongoing weight loss program supervised by his primary physician.   3. Elevated LFTs. LFTs had normalized as of May 2014.

## 2014-06-17 ENCOUNTER — Encounter: Payer: Self-pay | Admitting: Gastroenterology

## 2014-08-26 IMAGING — CR DG FINGER RING 2+V*R*
3 series · 3 of 3 positions shown · non-contrast
Comparison: None.

CLINICAL DATA: Finger injury. Swelling.

RIGHT RING FINGER 2+V

[view not recorded (1 of 3)]
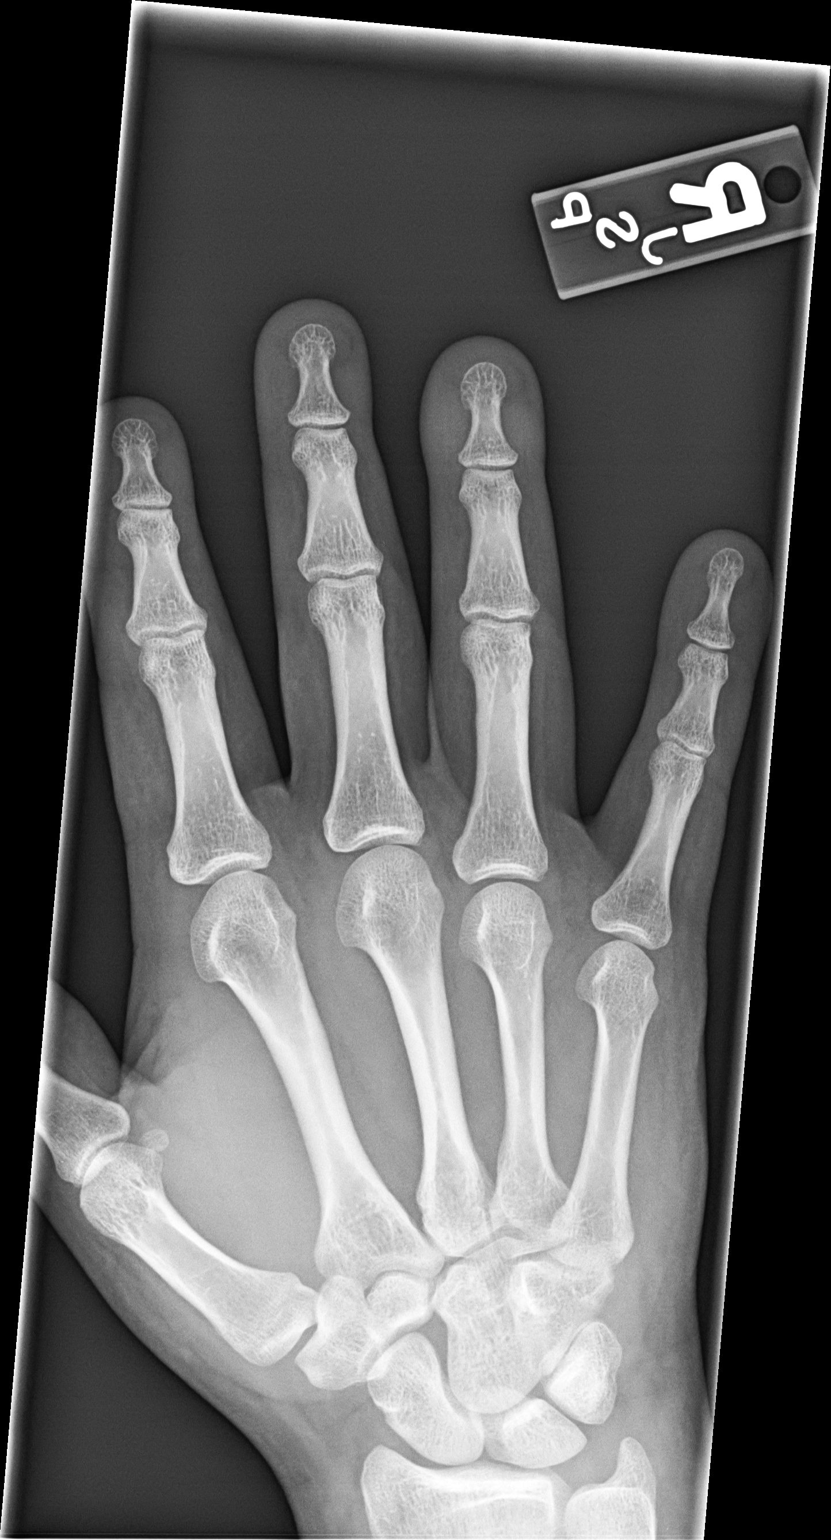

[view not recorded (2 of 3)]
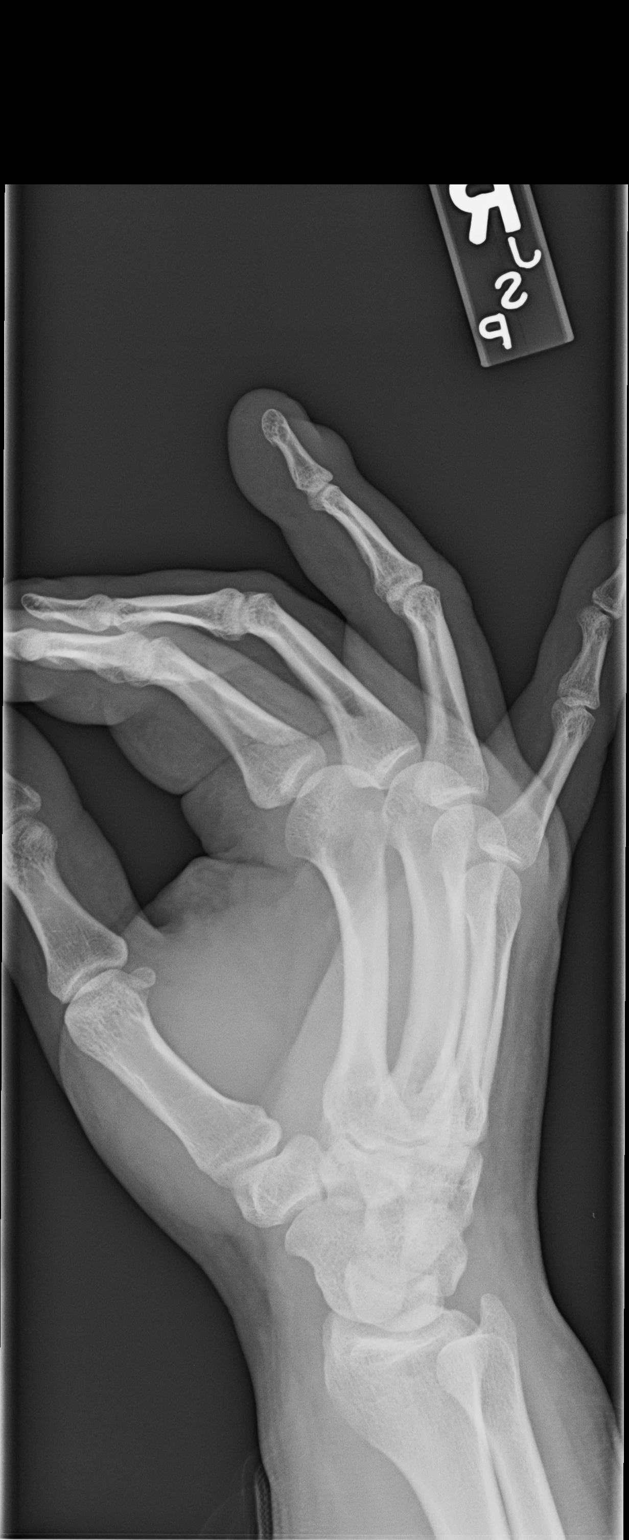

[view not recorded (3 of 3)]
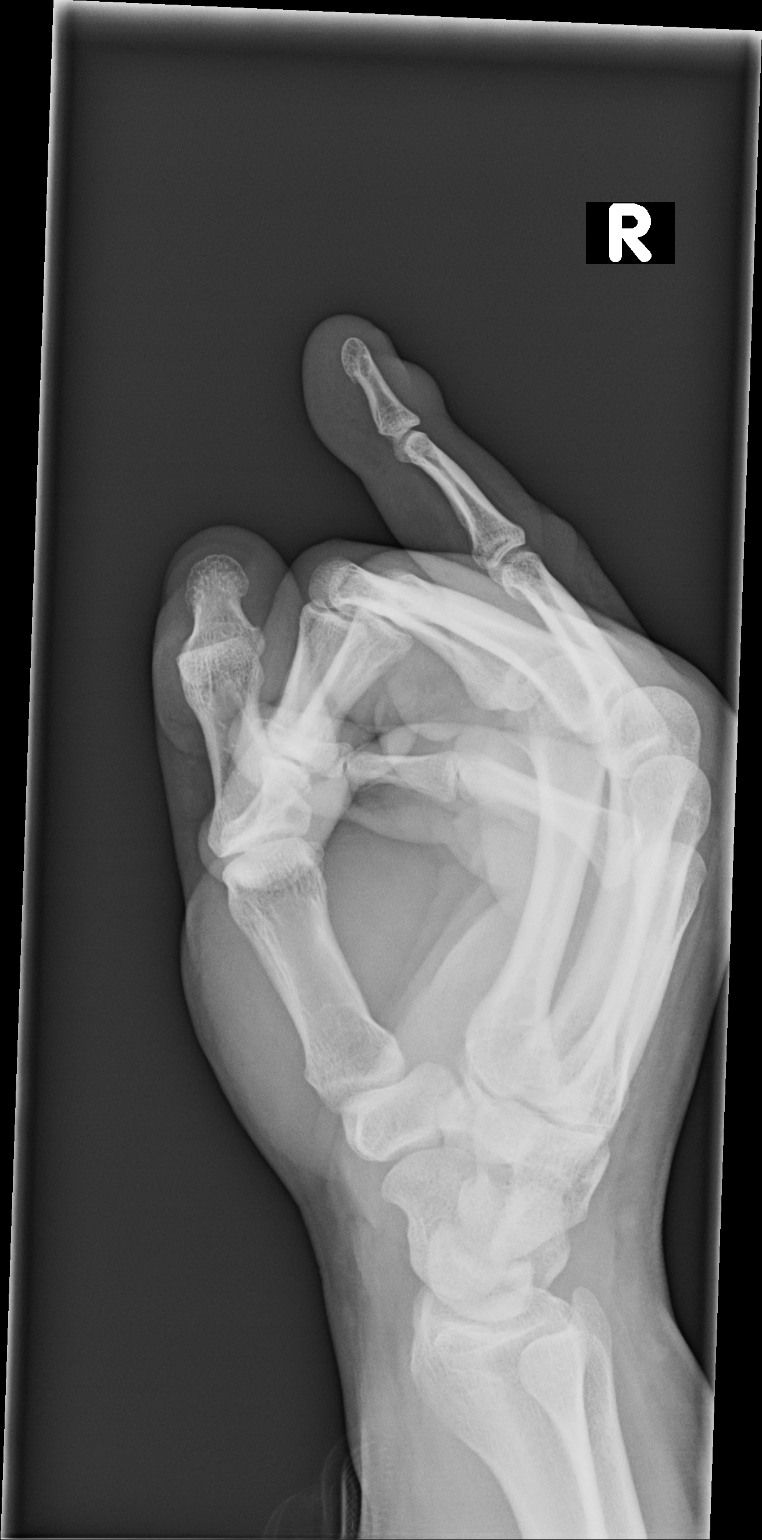

[3 of 3 positions shown; findings below may reference images not displayed]

FINDINGS: Soft tissue swelling noted dorsally at the level of the
distal phalanx, without underlying bony abnormality or foreign body
identified.
IMPRESSION: 1.  No acute bony findings or foreign body.
2.  Distal soft tissue swelling in the finger.

## 2014-09-22 ENCOUNTER — Other Ambulatory Visit: Payer: Self-pay | Admitting: Gastroenterology

## 2014-10-26 ENCOUNTER — Other Ambulatory Visit: Payer: Self-pay | Admitting: Gastroenterology

## 2014-11-28 ENCOUNTER — Other Ambulatory Visit: Payer: Self-pay | Admitting: Gastroenterology

## 2014-12-28 ENCOUNTER — Other Ambulatory Visit: Payer: Self-pay | Admitting: Gastroenterology

## 2015-01-25 ENCOUNTER — Encounter (HOSPITAL_COMMUNITY): Payer: Self-pay | Admitting: Emergency Medicine

## 2015-01-25 ENCOUNTER — Emergency Department (HOSPITAL_COMMUNITY)
Admission: EM | Admit: 2015-01-25 | Discharge: 2015-01-26 | Payer: BLUE CROSS/BLUE SHIELD | Attending: Emergency Medicine | Admitting: Emergency Medicine

## 2015-01-25 DIAGNOSIS — Z79899 Other long term (current) drug therapy: Secondary | ICD-10-CM | POA: Diagnosis not present

## 2015-01-25 DIAGNOSIS — Z046 Encounter for general psychiatric examination, requested by authority: Secondary | ICD-10-CM

## 2015-01-25 DIAGNOSIS — Z008 Encounter for other general examination: Secondary | ICD-10-CM | POA: Insufficient documentation

## 2015-01-25 DIAGNOSIS — F909 Attention-deficit hyperactivity disorder, unspecified type: Secondary | ICD-10-CM | POA: Insufficient documentation

## 2015-01-25 DIAGNOSIS — R451 Restlessness and agitation: Secondary | ICD-10-CM | POA: Diagnosis not present

## 2015-01-25 DIAGNOSIS — F1410 Cocaine abuse, uncomplicated: Secondary | ICD-10-CM | POA: Insufficient documentation

## 2015-01-25 DIAGNOSIS — G4700 Insomnia, unspecified: Secondary | ICD-10-CM | POA: Insufficient documentation

## 2015-01-25 DIAGNOSIS — F419 Anxiety disorder, unspecified: Secondary | ICD-10-CM | POA: Diagnosis not present

## 2015-01-25 DIAGNOSIS — K219 Gastro-esophageal reflux disease without esophagitis: Secondary | ICD-10-CM | POA: Diagnosis not present

## 2015-01-25 DIAGNOSIS — F22 Delusional disorders: Secondary | ICD-10-CM | POA: Diagnosis not present

## 2015-01-25 DIAGNOSIS — E669 Obesity, unspecified: Secondary | ICD-10-CM | POA: Diagnosis not present

## 2015-01-25 DIAGNOSIS — F329 Major depressive disorder, single episode, unspecified: Secondary | ICD-10-CM | POA: Insufficient documentation

## 2015-01-25 DIAGNOSIS — F29 Unspecified psychosis not due to a substance or known physiological condition: Secondary | ICD-10-CM | POA: Diagnosis not present

## 2015-01-25 DIAGNOSIS — Z8619 Personal history of other infectious and parasitic diseases: Secondary | ICD-10-CM | POA: Diagnosis not present

## 2015-01-25 DIAGNOSIS — F4310 Post-traumatic stress disorder, unspecified: Secondary | ICD-10-CM | POA: Insufficient documentation

## 2015-01-25 DIAGNOSIS — Z87891 Personal history of nicotine dependence: Secondary | ICD-10-CM | POA: Insufficient documentation

## 2015-01-25 DIAGNOSIS — H5704 Mydriasis: Secondary | ICD-10-CM | POA: Insufficient documentation

## 2015-01-25 LAB — ACETAMINOPHEN LEVEL: Acetaminophen (Tylenol), Serum: <10 ug/mL — ABNORMAL LOW (ref 10–30)

## 2015-01-25 LAB — CBC
HEMATOCRIT: 40.9 % (ref 39.0–52.0)
HEMOGLOBIN: 13.2 g/dL (ref 13.0–17.0)
MCH: 28.7 pg (ref 26.0–34.0)
MCHC: 32.3 g/dL (ref 30.0–36.0)
MCV: 88.9 fL (ref 78.0–100.0)
Platelets: 265 10*3/uL (ref 150–400)
RBC: 4.6 MIL/uL (ref 4.22–5.81)
RDW: 14.6 % (ref 11.5–15.5)
WBC: 6.3 10*3/uL (ref 4.0–10.5)

## 2015-01-25 LAB — COMPREHENSIVE METABOLIC PANEL
ALBUMIN: 4.4 g/dL (ref 3.5–5.0)
ALT: 48 U/L (ref 17–63)
AST: 33 U/L (ref 15–41)
Alkaline Phosphatase: 75 U/L (ref 38–126)
Anion gap: 7 (ref 5–15)
BUN: 17 mg/dL (ref 6–20)
CHLORIDE: 106 mmol/L (ref 101–111)
CO2: 27 mmol/L (ref 22–32)
Calcium: 9.4 mg/dL (ref 8.9–10.3)
Creatinine, Ser: 1.03 mg/dL (ref 0.61–1.24)
GFR calc Af Amer: >60 mL/min (ref >60)
GFR calc non Af Amer: >60 mL/min (ref >60)
GLUCOSE: 92 mg/dL (ref 65–99)
Potassium: 4.2 mmol/L (ref 3.5–5.1)
Sodium: 140 mmol/L (ref 135–145)
Total Bilirubin: 0.6 mg/dL (ref 0.3–1.2)
Total Protein: 7.4 g/dL (ref 6.5–8.1)

## 2015-01-25 LAB — RAPID URINE DRUG SCREEN, HOSP PERFORMED
AMPHETAMINES: NOT DETECTED
Barbiturates: NOT DETECTED
Benzodiazepines: NOT DETECTED
Cocaine: POSITIVE — AB
OPIATES: NOT DETECTED
TETRAHYDROCANNABINOL: NOT DETECTED

## 2015-01-25 LAB — SALICYLATE LEVEL

## 2015-01-25 LAB — ETHANOL: Alcohol, Ethyl (B): <5 mg/dL (ref <5)

## 2015-01-25 MED ORDER — LORAZEPAM 1 MG PO TABS: 1.0000 mg | ORAL_TABLET | Freq: Three times a day (TID) | ORAL | Status: DC | PRN

## 2015-01-25 MED ORDER — METHYLPHENIDATE HCL ER (OSM) 36 MG PO TBCR: 54.0000 mg | EXTENDED_RELEASE_TABLET | Freq: Every morning | ORAL | Status: DC

## 2015-01-25 MED ORDER — NICOTINE 21 MG/24HR TD PT24
21.0000 mg | MEDICATED_PATCH | Freq: Every day | TRANSDERMAL | Status: DC
  Administered 2015-01-26: 21 mg via TRANSDERMAL
  Filled 2015-01-25: qty 1

## 2015-01-25 MED ORDER — TRAZODONE HCL 100 MG PO TABS: 300.0000 mg | ORAL_TABLET | Freq: Every day | ORAL | Status: DC

## 2015-01-25 MED ORDER — GABAPENTIN 300 MG PO CAPS
600.0000 mg | ORAL_CAPSULE | Freq: Four times a day (QID) | ORAL | Status: DC
  Administered 2015-01-26 (×2): 600 mg via ORAL
  Filled 2015-01-25 (×2): qty 2

## 2015-01-25 MED ORDER — LORAZEPAM 1 MG PO TABS
1.0000 mg | ORAL_TABLET | Freq: Once | ORAL | Status: AC
  Administered 2015-01-25: 1 mg via ORAL
  Filled 2015-01-25: qty 1

## 2015-01-25 MED ORDER — ACETAMINOPHEN 325 MG PO TABS
650.0000 mg | ORAL_TABLET | ORAL | Status: DC | PRN
  Administered 2015-01-26: 650 mg via ORAL
  Filled 2015-01-25: qty 2

## 2015-01-25 MED ORDER — PANTOPRAZOLE SODIUM 40 MG PO TBEC
40.0000 mg | DELAYED_RELEASE_TABLET | Freq: Every day | ORAL | Status: DC
  Administered 2015-01-26: 40 mg via ORAL
  Filled 2015-01-25: qty 1

## 2015-01-25 MED ORDER — DISULFIRAM 250 MG PO TABS
250.0000 mg | ORAL_TABLET | Freq: Every day | ORAL | Status: DC
  Administered 2015-01-26: 250 mg via ORAL
  Filled 2015-01-25: qty 1

## 2015-01-25 MED ORDER — VENLAFAXINE HCL ER 150 MG PO CP24
225.0000 mg | ORAL_CAPSULE | Freq: Every day | ORAL | Status: DC
  Administered 2015-01-26: 225 mg via ORAL
  Filled 2015-01-25 (×2): qty 1

## 2015-01-25 MED ORDER — ZOLPIDEM TARTRATE 5 MG PO TABS: 5.0000 mg | ORAL_TABLET | Freq: Every evening | ORAL | Status: DC | PRN

## 2015-01-25 MED ORDER — ONDANSETRON HCL 4 MG PO TABS: 4.0000 mg | ORAL_TABLET | Freq: Three times a day (TID) | ORAL | Status: DC | PRN

## 2015-01-25 MED ORDER — DIPHENHYDRAMINE HCL 25 MG PO CAPS: 25.0000 mg | ORAL_CAPSULE | Freq: Every evening | ORAL | Status: DC | PRN

## 2015-01-25 MED ORDER — FINASTERIDE 1 MG PO TABS: 1.0000 mg | ORAL_TABLET | Freq: Every day | ORAL | Status: DC

## 2015-01-25 MED ORDER — IBUPROFEN 200 MG PO TABS: 600.0000 mg | ORAL_TABLET | Freq: Three times a day (TID) | ORAL | Status: DC | PRN

## 2015-01-25 MED ORDER — ALUM & MAG HYDROXIDE-SIMETH 200-200-20 MG/5ML PO SUSP: 30.0000 mL | ORAL | Status: DC | PRN

## 2015-01-25 MED ORDER — BUPRENORPHINE HCL 8 MG SL SUBL
8.0000 mg | SUBLINGUAL_TABLET | Freq: Three times a day (TID) | SUBLINGUAL | Status: DC
  Administered 2015-01-26 (×2): 8 mg via SUBLINGUAL
  Filled 2015-01-25 (×2): qty 1

## 2015-01-25 MED ORDER — ZIPRASIDONE MESYLATE 20 MG IM SOLR
20.0000 mg | Freq: Once | INTRAMUSCULAR | Status: AC
  Administered 2015-01-26: 20 mg via INTRAMUSCULAR
  Filled 2015-01-25: qty 20

## 2015-01-25 NOTE — ED Notes (Signed)
Pt out to desk asking staff to look at the "bugs" on him.  Pt pointing to shirt. Nothing visible on his shirt. Pt stating, "I think they followed me. And I just want to make sure I'm not going crazy."

## 2015-01-25 NOTE — ED Notes (Signed)
Parents state that pt has been complaining of these hallucinations for 2 months.  Pt has been taking 45 min showers and screaming when "he sees the bugs fall off".  Pt denies SI/HI.  Pt also googled his symptoms yesterday and saw that he could have "internal parasites" according to web MD.  This made him worse.  When he saw that some of the other symptoms were pain, pt began complaining to his mom of pain all over.  Mom thinks this is psychosomatic from reading the symptoms.  Pt requires his parents to sleep with him at night to "keep the bugs away" and has been buying copious amounts of bug killing sprays.  Pt has a hx of opiate addiction but has been following up and has been clean.

## 2015-01-25 NOTE — ED Notes (Signed)
UNABLE TO COLLECT LABS PATIENT IS TALKING WITH PA
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
## 2015-01-25 NOTE — ED Notes (Signed)
Pt states that he is here to be seen because there are bugs that have been biting him for 2 months.  C/o pain that goes all over his body.  Describes red almond shaped bugs.  Pt is tearful in triage.  Points out bugs on his skin.  There is nothing there.

## 2015-01-25 NOTE — Progress Notes (Signed)
Patient has been referred for inpatient treatment at: [facility] - per Mickeal SkinnerPhoebe, fax referral for review. [facility] - per Despina HiddenKassy, accepting referrals today. [facility] - per intake, fax referral[facility] per Arlys JohnBrian, fax referral. [facility] - per [facility], open beds. [facility] - per intake, fax referral for the waitlist. [facility] - per Christiane HaJonathan, fax referral for review, 1 geriatric male bed available.  Melbourne Abtsatia Adesuwa Osgood, LCSWA Disposition staff 01/25/2015 11:32 PM

## 2015-01-25 NOTE — ED Notes (Signed)
Abigail and myself went to bedside to speak with patient and family member about the decision to stay for inpatient treatment or seek outpatient treatment.
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
## 2015-01-25 NOTE — ED Notes (Signed)
Pt attempted to urinate but was unable to. Provided a cup of water to try again in a few minutes.

## 2015-01-26 MED ORDER — GABAPENTIN 300 MG PO CAPS: 600.0000 mg | ORAL_CAPSULE | Freq: Three times a day (TID) | ORAL | Status: DC

## 2015-01-26 MED ORDER — RISPERIDONE 0.5 MG PO TABS
0.5000 mg | ORAL_TABLET | Freq: Two times a day (BID) | ORAL | Status: DC
  Administered 2015-01-26: 0.5 mg via ORAL
  Filled 2015-01-26: qty 1

## 2015-01-26 MED ORDER — HYDROXYZINE HCL 25 MG PO TABS
25.0000 mg | ORAL_TABLET | Freq: Four times a day (QID) | ORAL | Status: DC | PRN
  Administered 2015-01-26: 25 mg via ORAL
  Filled 2015-01-26: qty 1

## 2015-01-26 MED ORDER — STERILE WATER FOR INJECTION IJ SOLN
INTRAMUSCULAR | Status: AC
  Administered 2015-01-26: 2.1 mL
  Filled 2015-01-26: qty 10

## 2015-01-26 NOTE — ED Notes (Signed)
[facility].  Left the unit ambulatory with [employer].  All belongings given to the driver.

## 2015-01-26 NOTE — ED Notes (Signed)
Patient drowsy. Medicated in ED. Reports mild pain. Patient fell asleep. Environment adjusted.  Q 15 safety checks in place.

## 2015-01-26 NOTE — BH Assessment (Signed)
Patient accepted to [facility]. The accepting provider is Dr. Wendall StadeKohl. Nursing report [phone]. Patient is under IVC and [employer] will provide transport to the facility.

## 2015-01-26 NOTE — ED Notes (Signed)
[facility].  [facility]  Report called to Vernona RiegerLaura.

## 2015-01-26 NOTE — ED Notes (Signed)
Patient was very agitated, tearful, and anxious. Pt was refusing medication. Medication was given in an emergency situation due to patient needed medication to calm him down. Security was notified for assistance.

## 2015-01-26 NOTE — ED Notes (Signed)
PATIENTS PARENTS HAVE TOOK ALL BELONGINGS.

## 2015-02-25 ENCOUNTER — Other Ambulatory Visit: Payer: Self-pay | Admitting: Gastroenterology

## 2015-03-24 ENCOUNTER — Ambulatory Visit: Payer: Self-pay | Admitting: Gastroenterology

## 2015-04-14 ENCOUNTER — Other Ambulatory Visit: Payer: Self-pay | Admitting: Gastroenterology

## 2015-04-16 ENCOUNTER — Telehealth: Payer: Self-pay | Admitting: Gastroenterology

## 2015-04-16 NOTE — Telephone Encounter (Signed)
Left message for patient to return my call.

## 2015-04-19 NOTE — Telephone Encounter (Signed)
Left a message for patient to return my call. 

## 2015-04-20 MED ORDER — ESOMEPRAZOLE MAGNESIUM 40 MG PO CPDR: DELAYED_RELEASE_CAPSULE | ORAL | Status: DC

## 2015-04-20 NOTE — Addendum Note (Signed)
Addended by: Jessee AversLEWELLYN, Fritz Cauthon L on: 04/20/2015 08:56 AM   Modules accepted: Orders

## 2015-04-20 NOTE — Telephone Encounter (Signed)
Patient returned my call and states he has missed his last few appts because he was in rehab. Patient states he understands that he is over due for a visit and will come to the visit scheduled for 05/11/15 but states he is out of Nexium and would like one refill until then. Informed patient I will give him one refill but must come for his follow up visit. Pt verbalized understanding.

## 2015-05-11 ENCOUNTER — Encounter: Payer: Self-pay | Admitting: Gastroenterology

## 2015-05-11 ENCOUNTER — Ambulatory Visit: Payer: Self-pay | Admitting: Gastroenterology

## 2015-05-11 ENCOUNTER — Ambulatory Visit (INDEPENDENT_AMBULATORY_CARE_PROVIDER_SITE_OTHER): Payer: BLUE CROSS/BLUE SHIELD | Admitting: Gastroenterology

## 2015-05-11 VITALS — BP 110/68 | HR 76 | Ht 69.75 in | Wt 252.2 lb

## 2015-05-11 DIAGNOSIS — K2100 Gastro-esophageal reflux disease with esophagitis, without bleeding: Secondary | ICD-10-CM

## 2015-05-11 MED ORDER — OMEPRAZOLE 40 MG PO CPDR: 40.0000 mg | DELAYED_RELEASE_CAPSULE | Freq: Every day | ORAL | Status: DC

## 2015-05-11 NOTE — Patient Instructions (Signed)
We have sent the following medications to your pharmacy for you to pick up at your convenience:omeprazole.  Thank you for choosing me and [facility].  Malcolm T. Stark, Jr., MD., FACG   

## 2015-05-11 NOTE — Progress Notes (Signed)
    History of Present Illness: This is a 35 year old male with a history of LA Class C erosive esophagitis. His GERD symptoms are well controlled on Nexium. No GI complaints today. He would like something less strong than Nexium for GERD.  Current Medications, Allergies, Past Medical History, Past Surgical History, Family History and Social History were reviewed in [facility] electronic medical record.  Physical Exam: General: Well developed, well nourished, no acute distress Head: Normocephalic and atraumatic Eyes:  sclerae anicteric, EOMI Ears: Normal auditory acuity Mouth: No deformity or lesions Lungs: Clear throughout to auscultation Heart: Regular rate and rhythm; no murmurs, rubs or bruits Abdomen: Soft, non tender and non distended. No masses, hepatosplenomegaly or hernias noted. Normal Bowel sounds Musculoskeletal: Symmetrical with no gross deformities  Pulses:  Normal pulses noted Extremities: No clubbing, cyanosis, edema or deformities noted Neurological: Alert oriented x 4, grossly nonfocal Psychological:  Alert and cooperative. Normal mood and affect  Assessment and Recommendations:  1. GERD with a history of LA Class C erosive esophagitis. Continue standard antireflux measures and change to omperazole 40 mg daily. Avoid alcohol and other stressor foods.  2. Obesity. Substantial intentional weight loss. Encouraged him to continue with his ongoing weight loss program supervised by his primary physician.   I spent 15 minutes of face-to-face time with the patient. Greater than 50% of the time was spent counseling and coordinating care.
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
## 2016-04-22 ENCOUNTER — Other Ambulatory Visit: Payer: Self-pay | Admitting: Gastroenterology

## 2016-07-27 ENCOUNTER — Other Ambulatory Visit: Payer: Self-pay | Admitting: Gastroenterology

## 2016-08-01 ENCOUNTER — Telehealth: Payer: Self-pay | Admitting: Gastroenterology

## 2016-08-01 MED ORDER — OMEPRAZOLE 40 MG PO CPDR: 40.0000 mg | DELAYED_RELEASE_CAPSULE | Freq: Every day | ORAL | 1 refills | Status: DC

## 2016-08-01 NOTE — Telephone Encounter (Signed)
Prescription sent to patient's pharmacy and patient informed to keep appt for further refills.

## 2016-09-26 ENCOUNTER — Encounter: Payer: Self-pay | Admitting: Gastroenterology

## 2016-09-26 ENCOUNTER — Ambulatory Visit (INDEPENDENT_AMBULATORY_CARE_PROVIDER_SITE_OTHER): Payer: BLUE CROSS/BLUE SHIELD | Admitting: Gastroenterology

## 2016-09-26 VITALS — BP 110/80 | HR 72 | Ht 69.75 in | Wt 294.6 lb

## 2016-09-26 DIAGNOSIS — K2100 Gastro-esophageal reflux disease with esophagitis, without bleeding: Secondary | ICD-10-CM

## 2016-09-26 MED ORDER — OMEPRAZOLE 40 MG PO CPDR: 40.0000 mg | DELAYED_RELEASE_CAPSULE | Freq: Every day | ORAL | 11 refills | Status: DC

## 2016-09-26 NOTE — Patient Instructions (Signed)
We have sent the following medications to your pharmacy for you to pick up at your convenience: Omeprazole.   Normal BMI (Body Mass Index- based on height and weight) is between 19 and 25. Your BMI today is Body mass index is 42.57 kg/m. Marland Kitchen. Please consider follow up  regarding your BMI with your Primary Care Provider.  Thank you for choosing me and [facility].  Venita LickMalcolm T. Pleas KochStark, Jr., MD., Clementeen GrahamFACG

## 2016-09-26 NOTE — Progress Notes (Signed)
    History of Present Illness: This is a 36 year old male with GERD and LA class C esophagitis. His reflux symptoms are under good control on daily omeprazole. He states he discontinued all alcohol use, lost weight and his reflux symptoms have improved substantially.  Current Medications, Allergies, Past Medical History, Past Surgical History, Family History and Social History were reviewed in [facility] electronic medical record.  Physical Exam: General: Well developed, well nourished, no acute distress Head: Normocephalic and atraumatic Eyes:  sclerae anicteric, EOMI Ears: Normal auditory acuity Mouth: No deformity or lesions Lungs: Clear throughout to auscultation Heart: Regular rate and rhythm; no murmurs, rubs or bruits Abdomen: Soft, non tender and non distended. No masses, hepatosplenomegaly or hernias noted. Normal Bowel sounds Musculoskeletal: Symmetrical with no gross deformities  Pulses:  Normal pulses noted Extremities: No clubbing, cyanosis, edema or deformities noted Neurological: Alert oriented x 4, grossly nonfocal Psychological:  Alert and cooperative. Normal mood and affect  Assessment and Recommendations:  1. GERD with esophagitis. Follow standard antireflux measures. Encouraged to continue weight loss program. Strongly advised to discontinue cigarette smoking. Continue omeprazole 40 mg daily. REV in 1 year.

## 2017-07-18 ENCOUNTER — Other Ambulatory Visit: Payer: Self-pay | Admitting: Gastroenterology

## 2017-10-10 ENCOUNTER — Other Ambulatory Visit: Payer: Self-pay | Admitting: Gastroenterology

## 2017-10-17 ENCOUNTER — Encounter: Payer: Self-pay | Admitting: Psychiatry

## 2017-10-17 ENCOUNTER — Ambulatory Visit: Payer: BLUE CROSS/BLUE SHIELD | Admitting: Psychiatry

## 2017-10-17 VITALS — BP 124/82 | HR 80 | Ht 71.0 in | Wt 299.0 lb

## 2017-10-17 DIAGNOSIS — F5081 Binge eating disorder: Secondary | ICD-10-CM

## 2017-10-17 DIAGNOSIS — F22 Delusional disorders: Secondary | ICD-10-CM | POA: Insufficient documentation

## 2017-10-17 DIAGNOSIS — F411 Generalized anxiety disorder: Secondary | ICD-10-CM | POA: Diagnosis not present

## 2017-10-17 DIAGNOSIS — F902 Attention-deficit hyperactivity disorder, combined type: Secondary | ICD-10-CM

## 2017-10-17 MED ORDER — CLONAZEPAM 2 MG PO TABS: 2.0000 mg | ORAL_TABLET | Freq: Two times a day (BID) | ORAL | 0 refills | Status: DC | PRN
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
## 2017-10-19 ENCOUNTER — Telehealth: Payer: Self-pay | Admitting: Psychiatry

## 2017-10-19 NOTE — Telephone Encounter (Signed)
Steve Bass is having trouble waking up. Two days on Latuda. Believe he is having excessive sleepiness from LATuda.

## 2017-10-19 NOTE — Telephone Encounter (Signed)
Mother Steve Bass calls that after the second dose of Latuda 60 mg, Steve Bass is excessively sleepy and when he wakes up to take a walk outside with mother, he may be shaky.  She does acknowledge on the phone that he takes up to 300 mg of Benadryl at night to get to sleep, while taking a higher dose of Focalin now for [facility] class which he equates with helping him focus on things other than parasitosis as temporary relief.  She has not brought the exterminator for these delusions.  The Latuda at 60 mg compared to his usual 1.5 mg Risperdal may well be causing the sedation and we will hold the third dose for 36 hours and then change the dose to 30 mg or 1/2 tablet every evening meal attempting to stabilize the delusional disorder so that they do not get the exterminator and he does not pull his hair out.  She is giving Klonopin as one quarter or three-quarter tablet no more than 1 tablet daily, and she has observed him closely feeling confident he is not relapsing on other drugs or exhibiting other self-harm.  We do review extrapyramidal side effects unlikely with the Benadryl that he takes but addressing as needed intervention should such occur and then call for dosing change.

## 2017-10-22 NOTE — Telephone Encounter (Signed)
Update phone review with adoptive mother documents that Latuda 60 mg had to be reduced to 1/4 tablet total of 15 mg every bedtime for sedation though now the patient is alert ready for school tomorrow and allowing the dog to sleep in his room despite his previous pulling his hair out over the bug infestation he somatically deluded.

## 2017-10-22 NOTE — Telephone Encounter (Signed)
Chart in providers box.  Chart was out of building due to abstraction.

## 2017-10-23 ENCOUNTER — Other Ambulatory Visit: Payer: Self-pay | Admitting: Psychiatry

## 2017-10-23 DIAGNOSIS — F411 Generalized anxiety disorder: Secondary | ICD-10-CM

## 2017-10-23 MED ORDER — VENLAFAXINE HCL ER 75 MG PO CP24: 150.0000 mg | ORAL_CAPSULE | Freq: Every day | ORAL | 1 refills | Status: DC

## 2017-10-23 MED ORDER — TRAZODONE HCL 150 MG PO TABS: 300.0000 mg | ORAL_TABLET | Freq: Every day | ORAL | 1 refills | Status: DC

## 2017-10-23 NOTE — Progress Notes (Signed)
[facility] after last appointment request trazodone and venlafaxine refills [facility] as trazodone 150 milligrams taking 2 nightly and venlafaxine 75 mg taking 2 every morning as a 64-month supply and 1 refill each.

## 2017-10-31 ENCOUNTER — Ambulatory Visit: Payer: BLUE CROSS/BLUE SHIELD | Admitting: Psychiatry

## 2017-10-31 ENCOUNTER — Encounter: Payer: Self-pay | Admitting: Psychiatry

## 2017-10-31 VITALS — BP 130/86 | HR 76 | Ht 71.0 in | Wt 300.0 lb

## 2017-10-31 DIAGNOSIS — F902 Attention-deficit hyperactivity disorder, combined type: Secondary | ICD-10-CM | POA: Diagnosis not present

## 2017-10-31 DIAGNOSIS — F1524 Other stimulant dependence with stimulant-induced mood disorder: Secondary | ICD-10-CM | POA: Diagnosis not present

## 2017-10-31 DIAGNOSIS — F22 Delusional disorders: Secondary | ICD-10-CM

## 2017-10-31 DIAGNOSIS — F411 Generalized anxiety disorder: Secondary | ICD-10-CM | POA: Diagnosis not present

## 2017-10-31 DIAGNOSIS — F5081 Binge eating disorder: Secondary | ICD-10-CM

## 2017-10-31 MED ORDER — CLONAZEPAM 2 MG PO TABS: 2.0000 mg | ORAL_TABLET | Freq: Two times a day (BID) | ORAL | 1 refills | Status: DC | PRN

## 2017-10-31 MED ORDER — VENLAFAXINE HCL ER 75 MG PO CP24: 150.0000 mg | ORAL_CAPSULE | Freq: Every day | ORAL | 0 refills | Status: DC

## 2017-10-31 MED ORDER — LURASIDONE HCL 20 MG PO TABS: 20.0000 mg | ORAL_TABLET | Freq: Every evening | ORAL | 0 refills | Status: DC
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
## 2017-12-04 ENCOUNTER — Other Ambulatory Visit: Payer: Self-pay

## 2017-12-04 MED ORDER — PROPRANOLOL HCL ER 160 MG PO CP24: 320.0000 mg | ORAL_CAPSULE | Freq: Two times a day (BID) | ORAL | 0 refills | Status: DC

## 2017-12-05 ENCOUNTER — Other Ambulatory Visit: Payer: Self-pay | Admitting: Psychiatry

## 2017-12-05 ENCOUNTER — Telehealth: Payer: Self-pay | Admitting: Psychiatry

## 2017-12-05 MED ORDER — PROPRANOLOL HCL ER 160 MG PO CP24: 160.0000 mg | ORAL_CAPSULE | Freq: Two times a day (BID) | ORAL | 0 refills | Status: DC

## 2017-12-05 NOTE — Progress Notes (Signed)
Patient in picking up propranolol 160 mg LA capsule as #180 for 388-month supply at pharmacy notes that directions are 2 capsules twice daily which would be a 45-day supply.  Review of electronic chart include 1 capsule twice daily and 2 capsules twice daily of the 160 mg LA and also entry for the 80 mg LA twice daily.  This course of titration then incorrect doubling of the dosing directions are corrected in the chart and phone calls placed to the patient and adoptive mother of correction to the directions of 1 capsule 160 mg LA twice daily though answer or message machine yet to keep trying.

## 2017-12-05 NOTE — Telephone Encounter (Signed)
Patient submitted rx for Inderal 160mg  2 caps 2 x q d #180.  Has been taking 1 cap 2 x q d.  Please clarify because pt not aware of change

## 2017-12-07 ENCOUNTER — Other Ambulatory Visit: Payer: Self-pay

## 2017-12-07 ENCOUNTER — Telehealth: Payer: Self-pay | Admitting: Psychiatry

## 2017-12-07 MED ORDER — PROPRANOLOL HCL ER 160 MG PO CP24: 160.0000 mg | ORAL_CAPSULE | Freq: Two times a day (BID) | ORAL | 0 refills | Status: DC

## 2017-12-07 NOTE — Telephone Encounter (Signed)
[facility]  [phone] Need you to verify dose on Propranolol- Is it  1 pill bid or 2 pills bid.  According to pt is should be 1 po bid

## 2017-12-07 NOTE — Telephone Encounter (Signed)
[facility] as I did for adoptive mother yesterday the dosing error on propranolol LA 160 mg as 1 not 2 bid # 180.

## 2017-12-11 ENCOUNTER — Other Ambulatory Visit: Payer: Self-pay

## 2017-12-11 MED ORDER — GABAPENTIN 600 MG PO TABS: 600.0000 mg | ORAL_TABLET | Freq: Four times a day (QID) | ORAL | 0 refills | Status: DC

## 2017-12-18 ENCOUNTER — Encounter: Payer: Self-pay | Admitting: Emergency Medicine

## 2017-12-31 ENCOUNTER — Encounter: Payer: Self-pay | Admitting: Psychiatry

## 2017-12-31 ENCOUNTER — Ambulatory Visit: Payer: BLUE CROSS/BLUE SHIELD | Admitting: Psychiatry

## 2017-12-31 VITALS — BP 126/88 | HR 84 | Ht 70.0 in | Wt 296.0 lb

## 2017-12-31 DIAGNOSIS — F5081 Binge eating disorder: Secondary | ICD-10-CM | POA: Diagnosis not present

## 2017-12-31 DIAGNOSIS — F22 Delusional disorders: Secondary | ICD-10-CM | POA: Diagnosis not present

## 2017-12-31 DIAGNOSIS — F902 Attention-deficit hyperactivity disorder, combined type: Secondary | ICD-10-CM

## 2017-12-31 DIAGNOSIS — F1524 Other stimulant dependence with stimulant-induced mood disorder: Secondary | ICD-10-CM

## 2017-12-31 DIAGNOSIS — F411 Generalized anxiety disorder: Secondary | ICD-10-CM | POA: Diagnosis not present
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
## 2018-01-01 ENCOUNTER — Other Ambulatory Visit: Payer: Self-pay | Admitting: Gastroenterology

## 2018-01-02 ENCOUNTER — Other Ambulatory Visit: Payer: Self-pay | Admitting: Psychiatry

## 2018-01-02 DIAGNOSIS — F411 Generalized anxiety disorder: Secondary | ICD-10-CM

## 2018-01-02 DIAGNOSIS — F22 Delusional disorders: Secondary | ICD-10-CM

## 2018-01-21 ENCOUNTER — Encounter: Payer: Self-pay | Admitting: Psychiatry

## 2018-01-21 ENCOUNTER — Ambulatory Visit: Payer: BLUE CROSS/BLUE SHIELD | Admitting: Psychiatry

## 2018-01-21 VITALS — BP 128/86 | HR 78 | Ht 70.0 in | Wt 298.0 lb

## 2018-01-21 DIAGNOSIS — F22 Delusional disorders: Secondary | ICD-10-CM

## 2018-01-21 DIAGNOSIS — F411 Generalized anxiety disorder: Secondary | ICD-10-CM

## 2018-01-21 DIAGNOSIS — F1594 Other stimulant use, unspecified with stimulant-induced mood disorder: Secondary | ICD-10-CM

## 2018-01-21 DIAGNOSIS — F902 Attention-deficit hyperactivity disorder, combined type: Secondary | ICD-10-CM | POA: Diagnosis not present

## 2018-01-21 DIAGNOSIS — F5081 Binge eating disorder: Secondary | ICD-10-CM

## 2018-01-21 MED ORDER — CLONAZEPAM 2 MG PO TABS: 2.0000 mg | ORAL_TABLET | Freq: Two times a day (BID) | ORAL | 0 refills | Status: DC | PRN
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
## 2018-01-24 ENCOUNTER — Telehealth: Payer: Self-pay | Admitting: Psychiatry

## 2018-01-24 NOTE — Telephone Encounter (Signed)
Should I get samples or send in RX? Last OV says to continue Latuda 20mg /day

## 2018-01-24 NOTE — Telephone Encounter (Signed)
Johnny Bridge called on behalf on [name] concerning the Steve Bass. Stated failed to discuss the continuation of this medication during the office visit.  In the pass he was receiving samples. Would like more samples if available or please write a prescription.Please call Johnny Bridge at [phone] when ready.

## 2018-01-24 NOTE — Telephone Encounter (Signed)
Phone call returned to adoptive mother confirming that they have exhausted the dosing of Latuda 1/4 of 60 mg total 15 mg nightly and are currently using the 20 mg nightly.  As patient has been changed from previous Risperdal and has at least 3 pathologies in exacerbation currently, sample of Latuda 20 mg nightly #28 will be picked up by mother or [name] when he had sees Evelena Peat 01/29/2018.

## 2018-02-01 ENCOUNTER — Telehealth: Payer: Self-pay | Admitting: Psychiatry
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
## 2018-02-01 NOTE — Telephone Encounter (Signed)
Steve Bass  ( mom) is calling and wishes to speak with Dr. Marlyne Beards about Steve Bass and his current symptoms. Advised that Dr. Marlyne Beards is out of office on Fridays.

## 2018-02-08 ENCOUNTER — Other Ambulatory Visit: Payer: Self-pay | Admitting: Gastroenterology

## 2018-02-08 ENCOUNTER — Telehealth: Payer: Self-pay | Admitting: Gastroenterology

## 2018-02-08 MED ORDER — OMEPRAZOLE 40 MG PO CPDR: DELAYED_RELEASE_CAPSULE | ORAL | 0 refills | Status: DC

## 2018-02-08 NOTE — Telephone Encounter (Signed)
Left message with patient's mother that she is not on the DPR and to have patient return my call.

## 2018-02-08 NOTE — Telephone Encounter (Signed)
Patient mother calling to get medication omeprazole refilled. Pt scheduled for ov on 2.25.2020.

## 2018-02-12 NOTE — Telephone Encounter (Signed)
Left message for patient to return my call.

## 2018-02-20 ENCOUNTER — Ambulatory Visit: Payer: BLUE CROSS/BLUE SHIELD | Admitting: Psychiatry

## 2018-02-20 ENCOUNTER — Encounter: Payer: Self-pay | Admitting: Psychiatry

## 2018-02-20 VITALS — BP 130/82 | HR 78 | Ht 70.0 in | Wt 275.0 lb

## 2018-02-20 DIAGNOSIS — F411 Generalized anxiety disorder: Secondary | ICD-10-CM

## 2018-02-20 DIAGNOSIS — F1524 Other stimulant dependence with stimulant-induced mood disorder: Secondary | ICD-10-CM

## 2018-02-20 DIAGNOSIS — F5081 Binge eating disorder: Secondary | ICD-10-CM

## 2018-02-20 DIAGNOSIS — F22 Delusional disorders: Secondary | ICD-10-CM

## 2018-02-20 DIAGNOSIS — F902 Attention-deficit hyperactivity disorder, combined type: Secondary | ICD-10-CM | POA: Diagnosis not present
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
## 2018-02-21 ENCOUNTER — Inpatient Hospital Stay (HOSPITAL_COMMUNITY)
Admission: EM | Admit: 2018-02-21 | Discharge: 2018-02-26 | DRG: 897 | Disposition: A | Discharge status: Home / Self Care | Payer: BLUE CROSS/BLUE SHIELD | Attending: Internal Medicine | Admitting: Internal Medicine

## 2018-02-21 ENCOUNTER — Encounter (HOSPITAL_COMMUNITY): Payer: Self-pay

## 2018-02-21 ENCOUNTER — Inpatient Hospital Stay (HOSPITAL_COMMUNITY): Payer: BLUE CROSS/BLUE SHIELD

## 2018-02-21 ENCOUNTER — Other Ambulatory Visit: Payer: Self-pay

## 2018-02-21 DIAGNOSIS — R441 Visual hallucinations: Secondary | ICD-10-CM | POA: Diagnosis not present

## 2018-02-21 DIAGNOSIS — F17210 Nicotine dependence, cigarettes, uncomplicated: Secondary | ICD-10-CM | POA: Diagnosis present

## 2018-02-21 DIAGNOSIS — G4733 Obstructive sleep apnea (adult) (pediatric): Secondary | ICD-10-CM | POA: Diagnosis present

## 2018-02-21 DIAGNOSIS — F19239 Other psychoactive substance dependence with withdrawal, unspecified: Secondary | ICD-10-CM | POA: Diagnosis present

## 2018-02-21 DIAGNOSIS — Z79899 Other long term (current) drug therapy: Secondary | ICD-10-CM

## 2018-02-21 DIAGNOSIS — F19132 Other psychoactive substance abuse with withdrawal with perceptual disturbance: Secondary | ICD-10-CM

## 2018-02-21 DIAGNOSIS — E669 Obesity, unspecified: Secondary | ICD-10-CM | POA: Diagnosis present

## 2018-02-21 DIAGNOSIS — Z881 Allergy status to other antibiotic agents status: Secondary | ICD-10-CM

## 2018-02-21 DIAGNOSIS — K219 Gastro-esophageal reflux disease without esophagitis: Secondary | ICD-10-CM | POA: Diagnosis present

## 2018-02-21 DIAGNOSIS — F19232 Other psychoactive substance dependence with withdrawal with perceptual disturbance: Secondary | ICD-10-CM

## 2018-02-21 DIAGNOSIS — Z9119 Patient's noncompliance with other medical treatment and regimen: Secondary | ICD-10-CM

## 2018-02-21 DIAGNOSIS — F410 Panic disorder [episodic paroxysmal anxiety] without agoraphobia: Secondary | ICD-10-CM | POA: Diagnosis present

## 2018-02-21 DIAGNOSIS — F1210 Cannabis abuse, uncomplicated: Secondary | ICD-10-CM | POA: Diagnosis present

## 2018-02-21 DIAGNOSIS — F1510 Other stimulant abuse, uncomplicated: Secondary | ICD-10-CM | POA: Diagnosis present

## 2018-02-21 DIAGNOSIS — F909 Attention-deficit hyperactivity disorder, unspecified type: Secondary | ICD-10-CM | POA: Diagnosis present

## 2018-02-21 DIAGNOSIS — F4310 Post-traumatic stress disorder, unspecified: Secondary | ICD-10-CM | POA: Diagnosis present

## 2018-02-21 DIAGNOSIS — F10231 Alcohol dependence with withdrawal delirium: Secondary | ICD-10-CM | POA: Diagnosis not present

## 2018-02-21 DIAGNOSIS — F1910 Other psychoactive substance abuse, uncomplicated: Secondary | ICD-10-CM | POA: Diagnosis present

## 2018-02-21 DIAGNOSIS — F10931 Alcohol use, unspecified with withdrawal delirium: Secondary | ICD-10-CM | POA: Diagnosis present

## 2018-02-21 DIAGNOSIS — Z6835 Body mass index (BMI) 35.0-35.9, adult: Secondary | ICD-10-CM

## 2018-02-21 DIAGNOSIS — F10230 Alcohol dependence with withdrawal, uncomplicated: Secondary | ICD-10-CM

## 2018-02-21 DIAGNOSIS — F1410 Cocaine abuse, uncomplicated: Secondary | ICD-10-CM | POA: Diagnosis present

## 2018-02-21 DIAGNOSIS — F319 Bipolar disorder, unspecified: Secondary | ICD-10-CM | POA: Diagnosis present

## 2018-02-21 DIAGNOSIS — F10930 Alcohol use, unspecified with withdrawal, uncomplicated: Secondary | ICD-10-CM

## 2018-02-21 LAB — CBC
HCT: 47.1 % (ref 39.0–52.0)
Hemoglobin: 15.4 g/dL (ref 13.0–17.0)
MCH: 30.9 pg (ref 26.0–34.0)
MCHC: 32.7 g/dL (ref 30.0–36.0)
MCV: 94.6 fL (ref 80.0–100.0)
Platelets: 270 10*3/uL (ref 150–400)
RBC: 4.98 MIL/uL (ref 4.22–5.81)
RDW: 12.1 % (ref 11.5–15.5)
WBC: 8.8 10*3/uL (ref 4.0–10.5)
nRBC: 0 % (ref 0.0–0.2)

## 2018-02-21 LAB — COMPREHENSIVE METABOLIC PANEL
ALT: 32 U/L (ref 0–44)
AST: 26 U/L (ref 15–41)
Albumin: 4.7 g/dL (ref 3.5–5.0)
Alkaline Phosphatase: 61 U/L (ref 38–126)
Anion gap: 9 (ref 5–15)
BUN: 13 mg/dL (ref 6–20)
CALCIUM: 9.5 mg/dL (ref 8.9–10.3)
CO2: 24 mmol/L (ref 22–32)
Chloride: 103 mmol/L (ref 98–111)
Creatinine, Ser: 0.98 mg/dL (ref 0.61–1.24)
GFR calc Af Amer: >60 mL/min (ref >60)
Glucose, Bld: 119 mg/dL — ABNORMAL HIGH (ref 70–99)
Potassium: 4 mmol/L (ref 3.5–5.1)
Sodium: 136 mmol/L (ref 135–145)
Total Bilirubin: 0.7 mg/dL (ref 0.3–1.2)
Total Protein: 7.6 g/dL (ref 6.5–8.1)

## 2018-02-21 LAB — ETHANOL: Alcohol, Ethyl (B): <10 mg/dL (ref <10)

## 2018-02-21 LAB — RAPID URINE DRUG SCREEN, HOSP PERFORMED
Amphetamines: POSITIVE — AB
BENZODIAZEPINES: POSITIVE — AB
Barbiturates: NOT DETECTED
Cocaine: POSITIVE — AB
OPIATES: NOT DETECTED
Tetrahydrocannabinol: POSITIVE — AB

## 2018-02-21 MED ORDER — FOLIC ACID 1 MG PO TABS
1.0000 mg | ORAL_TABLET | Freq: Every day | ORAL | Status: DC
  Administered 2018-02-22 – 2018-02-26 (×5): 1 mg via ORAL
  Filled 2018-02-21 (×5): qty 1

## 2018-02-21 MED ORDER — GABAPENTIN 300 MG PO CAPS
600.0000 mg | ORAL_CAPSULE | Freq: Three times a day (TID) | ORAL | Status: DC
  Administered 2018-02-21 – 2018-02-26 (×13): 600 mg via ORAL
  Filled 2018-02-21 (×14): qty 2

## 2018-02-21 MED ORDER — LORAZEPAM 1 MG PO TABS: 0.0000 mg | ORAL_TABLET | Freq: Four times a day (QID) | ORAL | Status: AC

## 2018-02-21 MED ORDER — HYDROXYZINE HCL 25 MG PO TABS
25.0000 mg | ORAL_TABLET | Freq: Four times a day (QID) | ORAL | Status: DC | PRN
  Administered 2018-02-21 – 2018-02-25 (×10): 25 mg via ORAL
  Filled 2018-02-21 (×11): qty 1

## 2018-02-21 MED ORDER — GABAPENTIN 600 MG PO TABS: 600.0000 mg | ORAL_TABLET | Freq: Four times a day (QID) | ORAL | Status: DC

## 2018-02-21 MED ORDER — TRAZODONE HCL 100 MG PO TABS
300.0000 mg | ORAL_TABLET | Freq: Every day | ORAL | Status: DC
  Administered 2018-02-21 – 2018-02-25 (×4): 300 mg via ORAL
  Filled 2018-02-21 (×5): qty 3

## 2018-02-21 MED ORDER — PROPRANOLOL HCL ER 80 MG PO CP24
160.0000 mg | ORAL_CAPSULE | Freq: Two times a day (BID) | ORAL | Status: DC
  Administered 2018-02-21 – 2018-02-22 (×3): 160 mg via ORAL
  Filled 2018-02-21: qty 1
  Filled 2018-02-21 (×2): qty 2
  Filled 2018-02-21 (×2): qty 1
  Filled 2018-02-21: qty 2

## 2018-02-21 MED ORDER — CLONIDINE HCL 0.1 MG PO TABS: 0.1000 mg | ORAL_TABLET | Freq: Two times a day (BID) | ORAL | Status: DC

## 2018-02-21 MED ORDER — CLONIDINE HCL 0.1 MG PO TABS: 0.1000 mg | ORAL_TABLET | Freq: Every day | ORAL | Status: DC

## 2018-02-21 MED ORDER — ENOXAPARIN SODIUM 40 MG/0.4ML ~~LOC~~ SOLN
40.0000 mg | SUBCUTANEOUS | Status: DC
  Administered 2018-02-21 – 2018-02-25 (×4): 40 mg via SUBCUTANEOUS
  Filled 2018-02-21 (×5): qty 0.4

## 2018-02-21 MED ORDER — GABAPENTIN 300 MG PO CAPS
600.0000 mg | ORAL_CAPSULE | Freq: Four times a day (QID) | ORAL | Status: DC
  Administered 2018-02-21: 600 mg via ORAL
  Filled 2018-02-21: qty 2

## 2018-02-21 MED ORDER — LOPERAMIDE HCL 2 MG PO CAPS: 2.0000 mg | ORAL_CAPSULE | ORAL | Status: DC | PRN

## 2018-02-21 MED ORDER — VITAMIN B-1 100 MG PO TABS
100.0000 mg | ORAL_TABLET | Freq: Every day | ORAL | Status: DC
  Administered 2018-02-22 – 2018-02-26 (×5): 100 mg via ORAL
  Filled 2018-02-21 (×5): qty 1

## 2018-02-21 MED ORDER — LURASIDONE HCL 40 MG PO TABS
40.0000 mg | ORAL_TABLET | Freq: Every day | ORAL | Status: DC
  Administered 2018-02-22 – 2018-02-26 (×5): 40 mg via ORAL
  Filled 2018-02-21 (×6): qty 1

## 2018-02-21 MED ORDER — NICOTINE 21 MG/24HR TD PT24
21.0000 mg | MEDICATED_PATCH | Freq: Once | TRANSDERMAL | Status: AC
  Administered 2018-02-21: 21 mg via TRANSDERMAL
  Filled 2018-02-21: qty 1

## 2018-02-21 MED ORDER — CLONAZEPAM 1 MG PO TABS
2.0000 mg | ORAL_TABLET | Freq: Two times a day (BID) | ORAL | Status: DC | PRN
  Administered 2018-02-21: 2 mg via ORAL
  Filled 2018-02-21: qty 4

## 2018-02-21 MED ORDER — BUPRENORPHINE HCL 8 MG SL SUBL
8.0000 mg | SUBLINGUAL_TABLET | Freq: Three times a day (TID) | SUBLINGUAL | Status: DC
  Administered 2018-02-21 – 2018-02-26 (×14): 8 mg via SUBLINGUAL
  Filled 2018-02-21 (×14): qty 1

## 2018-02-21 MED ORDER — DICYCLOMINE HCL 20 MG PO TABS: 20.0000 mg | ORAL_TABLET | Freq: Four times a day (QID) | ORAL | Status: DC | PRN

## 2018-02-21 MED ORDER — ZIPRASIDONE MESYLATE 20 MG IM SOLR: 10.0000 mg | Freq: Once | INTRAMUSCULAR | Status: DC

## 2018-02-21 MED ORDER — LORAZEPAM 2 MG/ML IJ SOLN
0.0000 mg | Freq: Four times a day (QID) | INTRAMUSCULAR | Status: AC
  Administered 2018-02-21: 4 mg via INTRAVENOUS
  Administered 2018-02-22: 2 mg via INTRAVENOUS
  Administered 2018-02-22: 4 mg via INTRAVENOUS
  Administered 2018-02-22 – 2018-02-23 (×5): 2 mg via INTRAVENOUS
  Filled 2018-02-21 (×2): qty 1
  Filled 2018-02-21: qty 2
  Filled 2018-02-21: qty 1
  Filled 2018-02-21: qty 2
  Filled 2018-02-21 (×3): qty 1

## 2018-02-21 MED ORDER — LORAZEPAM 2 MG/ML IJ SOLN
0.0000 mg | Freq: Two times a day (BID) | INTRAMUSCULAR | Status: AC
  Administered 2018-02-23: 2 mg via INTRAVENOUS
  Administered 2018-02-24: 1 mg via INTRAVENOUS
  Filled 2018-02-21 (×2): qty 1

## 2018-02-21 MED ORDER — CLONIDINE HCL 0.1 MG PO TABS
0.1000 mg | ORAL_TABLET | Freq: Four times a day (QID) | ORAL | Status: DC
  Administered 2018-02-21 – 2018-02-22 (×6): 0.1 mg via ORAL
  Filled 2018-02-21 (×6): qty 1

## 2018-02-21 MED ORDER — ADULT MULTIVITAMIN W/MINERALS CH
1.0000 | ORAL_TABLET | Freq: Every day | ORAL | Status: DC
  Administered 2018-02-21 – 2018-02-26 (×6): 1 via ORAL
  Filled 2018-02-21 (×6): qty 1

## 2018-02-21 MED ORDER — PANTOPRAZOLE SODIUM 40 MG PO TBEC
40.0000 mg | DELAYED_RELEASE_TABLET | Freq: Every day | ORAL | Status: DC
  Administered 2018-02-21 – 2018-02-26 (×6): 40 mg via ORAL
  Filled 2018-02-21 (×6): qty 1

## 2018-02-21 MED ORDER — VENLAFAXINE HCL ER 150 MG PO CP24
150.0000 mg | ORAL_CAPSULE | Freq: Every day | ORAL | Status: DC
  Administered 2018-02-22 – 2018-02-26 (×5): 150 mg via ORAL
  Filled 2018-02-21 (×5): qty 1

## 2018-02-21 MED ORDER — LORAZEPAM 1 MG PO TABS
0.0000 mg | ORAL_TABLET | Freq: Two times a day (BID) | ORAL | Status: AC
  Administered 2018-02-25 (×2): 1 mg via ORAL
  Filled 2018-02-21 (×2): qty 1

## 2018-02-21 MED ORDER — ONDANSETRON 4 MG PO TBDP: 4.0000 mg | ORAL_TABLET | Freq: Four times a day (QID) | ORAL | Status: DC | PRN

## 2018-02-21 MED ORDER — NAPROXEN 250 MG PO TABS
500.0000 mg | ORAL_TABLET | Freq: Two times a day (BID) | ORAL | Status: DC | PRN
  Administered 2018-02-22 – 2018-02-24 (×3): 500 mg via ORAL
  Filled 2018-02-21 (×4): qty 2

## 2018-02-21 MED ORDER — METHOCARBAMOL 500 MG PO TABS: 500.0000 mg | ORAL_TABLET | Freq: Three times a day (TID) | ORAL | Status: DC | PRN

## 2018-02-21 MED ORDER — THIAMINE HCL 100 MG/ML IJ SOLN
100.0000 mg | Freq: Every day | INTRAMUSCULAR | Status: DC
  Administered 2018-02-21: 100 mg via INTRAVENOUS
  Filled 2018-02-21 (×2): qty 2

## 2018-02-21 NOTE — ED Provider Notes (Addendum)
[facility]-EMERGENCY DEPT Provider Note   CSN: [id number] Arrival date & time: 02/21/18  1351     History   Chief Complaint Chief Complaint  Patient presents with  . Withdrawal  . Hallucinations    HPI Steve Bass is a 38 y.o. male.  HPI Patient presents with drug abuse and withdrawal.  States that he uses pretty much every drug except barbiturates.  He will drink about 1/5 and a half a liquor a day.  He also uses cocaine meth heroin and ecstasy.  Last use this morning.  States he will see some trails running around things since the withdrawal.  States he is been seeing bugs for longer than.  Denies suicidal homicidal thoughts.  States he has been using these for a while.  Said he got seen by Dr. Marlyne BeardsJennings yesterday that thought he should come into the ER although the note is not in the chart yet.  Patient states he is hurting all over right now. Past Medical History:  Diagnosis Date  . ADD (attention deficit disorder with hyperactivity)   . Anxiety and depression   . GERD (gastroesophageal reflux disease)    LA classification Grade C EE  . Obesity   . PTSD (post-traumatic stress disorder)   . Shingles     Patient Active Problem List   Diagnosis Date Noted  . Delusional disorder, somatic type, multiple episodes, currently in acute episode (HCC) 10/17/2017  . Binge-eating disorder, moderate 10/17/2017  . Hypogonadism in male 05/28/2013  . Alcohol withdrawal (HCC) 04/08/2013  . Withdrawal syndrome (HCC) 04/08/2013  . Encephalopathy acute 04/08/2013  . Opioid dependence (HCC) 11/28/2012  . Smoker 10/20/2012  . Polysubstance dependence (HCC) 09/10/2012  . ADHD (attention deficit hyperactivity disorder) 09/10/2012  . Other stimulant dependence with stimulant-induced mood disorder (HCC) 09/10/2012  . Generalized anxiety disorder 09/10/2012  . Panic attacks 09/10/2012  . PTSD (post-traumatic stress disorder) 09/10/2012  . GERD 01/21/2008  . WEIGHT  LOSS-ABNORMAL 01/21/2008  . DIARRHEA 01/21/2008  . ABNORMAL TRANSAMINASE-LFT'S 01/21/2008  . SERUM ENZYME LEVELS, ABNORMAL 01/21/2008  . DIARRHEA OF PRESUMED INFECTIOUS ORIGIN 11/21/2007  . Attention deficit hyperactivity disorder (ADHD), combined type, severe 11/21/2007  . Hematemesis 11/21/2007  . Nausea with vomiting 11/21/2007    Past Surgical History:  Procedure Laterality Date  . WISDOM TOOTH EXTRACTION          Home Medications    Prior to Admission medications   Medication Sig Start Date End Date Taking? Authorizing Provider  Buprenorphine HCl-Naloxone HCl (SUBOXONE) 8-2 MG FILM Place 1 Film under the tongue 3 (three) times daily.    Yes [provider]  clonazePAM (KLONOPIN) 2 MG tablet Take 1 tablet (2 mg total) by mouth 2 (two) times daily as needed for anxiety. 01/21/18  Yes Chauncey MannJennings, Glenn E, MD  Dexmethylphenidate HCl 40 MG CP24 Take 40 mg by mouth daily. 01/30/18  Yes [provider]  finasteride (PROPECIA) 1 MG tablet Take 1 mg by mouth daily.  06/10/13  Yes [provider]  gabapentin (NEURONTIN) 600 MG tablet Take 1 tablet (600 mg total) by mouth 4 (four) times daily. 12/11/17  Yes Chauncey MannJennings, Glenn E, MD  lurasidone (LATUDA) 40 MG TABS tablet Take 40,060 mg by mouth daily with breakfast.   Yes [provider]  omeprazole (PRILOSEC) 40 MG capsule TAKE 1 CAPSULE BY MOUTH EVERY DAY 02/08/18  Yes Meryl DareStark, Malcolm T, MD  propranolol ER (INDERAL LA) 160 MG SR capsule Take 1 capsule (160 mg  total) by mouth 2 (two) times daily. 12/07/17  Yes Chauncey MannJennings, Glenn E, MD  traZODone (DESYREL) 150 MG tablet Take 2 tablets (300 mg total) by mouth at bedtime. 10/23/17  Yes Chauncey MannJennings, Glenn E, MD  venlafaxine XR (EFFEXOR-XR) 75 MG 24 hr capsule Take 2 capsules (150 mg total) by mouth daily with breakfast. 10/31/17  Yes Chauncey MannJennings, Glenn E, MD  lurasidone (LATUDA) 20 MG TABS tablet Take 1 tablet (20 mg total) by mouth Nightly. Patient not taking: Reported on  02/21/2018 10/31/17   Chauncey MannJennings, Glenn E, MD    Family History Family History  Adopted: Yes  Problem Relation Age of Onset  . Diabetes Paternal Grandfather     Social History Social History   Tobacco Use  . Smoking status: Current Some Day Smoker    Packs/day: 0.50    Years: 15.00    Pack years: 7.50    Types: Cigarettes    Last attempt to quit: 01/07/2013    Years since quitting: 5.1  . Smokeless tobacco: Never Used  . Tobacco comment: tobacco handout given 09/26/16  Substance Use Topics  . Alcohol use: No    Comment: patient states he quit drinking 09/24/13  . Drug use: No    Types: "Crack" cocaine, Marijuana, Oxycodone, Cocaine    Comment: heroine, percocets-patient states he quit 09/24/13     Allergies   Cefaclor   Review of Systems Review of Systems  Constitutional: Positive for appetite change.  HENT: Negative for congestion.   Respiratory: Negative for shortness of breath.   Cardiovascular: Negative for chest pain.  Gastrointestinal: Positive for nausea. Negative for abdominal distention.  Genitourinary: Negative for flank pain.  Musculoskeletal: Negative for back pain.  Neurological: Positive for tremors.  Psychiatric/Behavioral: Positive for hallucinations. Negative for suicidal ideas.     Physical Exam Updated Vital Signs BP (!) 148/97   Pulse 78   Temp 98.5 F (36.9 C) (Oral)   Resp 15   SpO2 99%   Physical Exam HENT:     Head: Atraumatic.     Mouth/Throat:     Mouth: Mucous membranes are moist.  Eyes:     Pupils: Pupils are equal, round, and reactive to light.  Neck:     Musculoskeletal: Neck supple.  Cardiovascular:     Rate and Rhythm: Normal rate.  Abdominal:     Tenderness: There is no abdominal tenderness.  Musculoskeletal:     Right lower leg: No edema.     Left lower leg: No edema.  Skin:    General: Skin is warm.     Capillary Refill: Capillary refill takes less than 2 seconds.  Neurological:     General: No focal deficit  present.     Mental Status: He is alert.      ED Treatments / Results  Labs (all labs ordered are listed, but only abnormal results are displayed) Labs Reviewed  COMPREHENSIVE METABOLIC PANEL - Abnormal; Notable for the following components:      Result Value   Glucose, Bld 119 (*)    All other components within normal limits  RAPID URINE DRUG SCREEN, HOSP PERFORMED - Abnormal; Notable for the following components:   Cocaine POSITIVE (*)    Benzodiazepines POSITIVE (*)    Amphetamines POSITIVE (*)    Tetrahydrocannabinol POSITIVE (*)    All other components within normal limits  ETHANOL  CBC    EKG None  Radiology No results found.  Procedures Procedures (including critical care time)  Medications Ordered in  ED Medications  clonazePAM (KLONOPIN) tablet 2 mg (2 mg Oral Given 02/21/18 1730)  pantoprazole (PROTONIX) EC tablet 40 mg (40 mg Oral Given 02/21/18 1729)  propranolol ER (INDERAL LA) SR capsule 160 mg (has no administration in time range)  traZODone (DESYREL) tablet 300 mg (has no administration in time range)  venlafaxine XR (EFFEXOR-XR) 24 hr capsule 150 mg (has no administration in time range)  lurasidone (LATUDA) tablet 40 mg (has no administration in time range)  gabapentin (NEURONTIN) capsule 600 mg (600 mg Oral Given 02/21/18 1730)  cloNIDine (CATAPRES) tablet 0.1 mg (has no administration in time range)    Followed by  cloNIDine (CATAPRES) tablet 0.1 mg (has no administration in time range)    Followed by  cloNIDine (CATAPRES) tablet 0.1 mg (has no administration in time range)  dicyclomine (BENTYL) tablet 20 mg (has no administration in time range)  hydrOXYzine (ATARAX/VISTARIL) tablet 25 mg (has no administration in time range)  loperamide (IMODIUM) capsule 2-4 mg (has no administration in time range)  methocarbamol (ROBAXIN) tablet 500 mg (has no administration in time range)  naproxen (NAPROSYN) tablet 500 mg (has no administration in time range)    ondansetron (ZOFRAN-ODT) disintegrating tablet 4 mg (has no administration in time range)  nicotine (NICODERM CQ - dosed in mg/24 hours) patch 21 mg (has no administration in time range)  LORazepam (ATIVAN) injection 0-4 mg (has no administration in time range)    Or  LORazepam (ATIVAN) tablet 0-4 mg (has no administration in time range)  LORazepam (ATIVAN) injection 0-4 mg (has no administration in time range)    Or  LORazepam (ATIVAN) tablet 0-4 mg (has no administration in time range)  thiamine (VITAMIN B-1) tablet 100 mg (has no administration in time range)    Or  thiamine (B-1) injection 100 mg (has no administration in time range)     Initial Impression / Assessment and Plan / ED Course  I have reviewed the triage vital signs and the nursing notes.  Pertinent labs & imaging results that were available during my care of the patient were reviewed by me and considered in my medical decision making (see chart for details).     Patient presents with polysubstance abuse.  Also some mild hallucinations.  Patient is medically cleared.  To be seen by TTS  Patient now has had a 2 elevated CIWA scores.  Uncontrolled with oral medications.  At this point require admission to hospital for alcohol withdrawal.  CRITICAL CARE Performed by: Benjiman Core Total critical care time: 30 minutes Critical care time was exclusive of separately billable procedures and treating other patients. Critical care was necessary to treat or prevent imminent or life-threatening deterioration. Critical care was time spent personally by me on the following activities: development of treatment plan with patient and/or surrogate as well as nursing, discussions with consultants, evaluation of patient's response to treatment, examination of patient, obtaining history from patient or surrogate, ordering and performing treatments and interventions, ordering and review of laboratory studies, ordering and review of  radiographic studies, pulse oximetry and re-evaluation of patient's condition.   Final Clinical Impressions(s) / ED Diagnoses   Final diagnoses:  Polysubstance abuse (HCC)  Alcohol withdrawal syndrome without complication [facility])    ED Discharge Orders    None       Benjiman Core, MD 02/21/18 1608    Benjiman Core, MD 02/21/18 1921

## 2018-02-21 NOTE — ED Notes (Signed)
Bed: WA24 Expected date:  Expected time:  Means of arrival:  Comments: Triage 3 

## 2018-02-21 NOTE — ED Notes (Signed)
Bed: WLPT3 Expected date:  Expected time:  Means of arrival:  Comments: 

## 2018-02-21 NOTE — BH Assessment (Signed)
[facility] Assessment Progress Note   Per Elta Guadeloupe, NP.  Patient will need to be started on a detox protocol, monitored for safety and withdrawal potential overnight and referred to a treatment center tomorrow for continued detox services.  Peer Support consult ordered.

## 2018-02-21 NOTE — BH Assessment (Signed)
[facility] Assessment Progress Note    TTS unable to patient currently due to two previous assessments coming in at approximately the same time.  Patient's nurse scored his CIWA at a 35.  This score will need to come down significantly in order for his to be placed in a drug treatment facility.  His hallucinations are most likely due to his substance use/withdrawal and he will most likely not be admitted to [facility].

## 2018-02-21 NOTE — ED Triage Notes (Addendum)
Pt reports he wants help with drug addiction. Pt reports he uses cocaine, meth, heroin, opioids, and alcohol. Pt reports visual hallucinations of bugs crawling. Pt states that he is an "impath" meaning that he can heal others and has special powers. Pt reports not sleeping in 4 days. Pt reports having prescription for clonidine and took it before he came here. Pt states that he has "burning through his veins" but states that he knows its not real. Pt reports using bath salts this morning. Pt reports that he has been using all of these drugs for about a year and a half due to losing best friend.

## 2018-02-21 NOTE — H&P (Signed)
History and Physical    Steve Bass EGB:[id number] DOB: Nov 26, 1980 DOA: 02/21/2018  PCP: Joette Catching, MD Patient coming from: Home  I have personally briefly reviewed patient's old medical records in [facility]  Chief Complaint: Mania, withdrawal symptoms  HPI: Steve Bass is a 38 y.o. male with medical history significant for Bipolar disorder, ADD, PTSD and polysubstance abuse (meth, bath salts, cocaine, alcohol) presenting with visual hallucinations of bugs crawling on his skin. This is in the setting of multidrug abuse. He is accompanied by his father. He has a long history of substance abuse. Reports injecting bath salts this morning prior to the hallucinations and drinking heavily the last few days (reports drinking a fifth of vodka last night). He has also not taken buprenorphine in the last 2 days because he sold the medicine for other drugs. He has no prior history of alcohol withdrawal or DTs. He does have history of prior admissions for similar symptoms as today. He endorses sharp, burning headache and blurry vision. He denies visual hallucinations at the time of our conversation, but does report them earlier in the day. Denies tremulousness or diaphoresis. No SI or HI. He reports that he would like help getting clean.  ED Course: In the ED, he was afebrile, normotensive and not tachycardic. Had multiple elevated CIWA scores requiring benzodiazepine administration. Labs are unremarkable. UDS positive for THC, cocaine, benzodiazepines and amphetamines.  Review of Systems: As per HPI otherwise 10 point review of systems negative.   Past Medical History:  Diagnosis Date  . ADD (attention deficit disorder with hyperactivity)   . Anxiety and depression   . GERD (gastroesophageal reflux disease)    LA classification Grade C EE  . Obesity   . PTSD (post-traumatic stress disorder)   . Shingles     Past Surgical History:  Procedure Laterality Date  . WISDOM TOOTH  EXTRACTION       reports that he has been smoking cigarettes. He has a 7.50 pack-year smoking history. He has never used smokeless tobacco. He reports that he does not drink alcohol or use drugs.  Allergies  Allergen Reactions  . Cefaclor Rash    Family History  Adopted: Yes  Problem Relation Age of Onset  . Diabetes Paternal Grandfather     Prior to Admission medications   Medication Sig Start Date End Date Taking? Authorizing Provider  Buprenorphine HCl-Naloxone HCl (SUBOXONE) 8-2 MG FILM Place 1 Film under the tongue 3 (three) times daily.    Yes [provider]  clonazePAM (KLONOPIN) 2 MG tablet Take 1 tablet (2 mg total) by mouth 2 (two) times daily as needed for anxiety. 01/21/18  Yes Chauncey Mann, MD  Dexmethylphenidate HCl 40 MG CP24 Take 40 mg by mouth daily. 01/30/18  Yes [provider]  finasteride (PROPECIA) 1 MG tablet Take 1 mg by mouth daily.  06/10/13  Yes [provider]  gabapentin (NEURONTIN) 600 MG tablet Take 1 tablet (600 mg total) by mouth 4 (four) times daily. 12/11/17  Yes Chauncey Mann, MD  lurasidone (LATUDA) 40 MG TABS tablet Take 40,060 mg by mouth daily with breakfast.   Yes [provider]  omeprazole (PRILOSEC) 40 MG capsule TAKE 1 CAPSULE BY MOUTH EVERY DAY 02/08/18  Yes Meryl Dare, MD  propranolol ER (INDERAL LA) 160 MG SR capsule Take 1 capsule (160 mg total) by mouth 2 (two) times daily. 12/07/17  Yes Chauncey Mann, MD  traZODone (DESYREL) 150 MG tablet  Take 2 tablets (300 mg total) by mouth at bedtime. 10/23/17  Yes Chauncey Mann, MD  venlafaxine XR (EFFEXOR-XR) 75 MG 24 hr capsule Take 2 capsules (150 mg total) by mouth daily with breakfast. 10/31/17  Yes Chauncey Mann, MD  lurasidone (LATUDA) 20 MG TABS tablet Take 1 tablet (20 mg total) by mouth Nightly. Patient not taking: Reported on 02/21/2018 10/31/17   Chauncey Mann, MD    Physical Exam: Vitals:   02/21/18 1730 02/21/18 1916  02/21/18 2000 02/21/18 2133  BP: (!) 148/97 (!) 128/91  129/83  Pulse: 78 75 82 77  Resp: 15 15 16 17   Temp:    98.3 F (36.8 C)  TempSrc:    Oral  SpO2: 99% 96% 93% 95%    Constitutional: NAD, calm, comfortable Eyes: PERRL, lids and conjunctivae normal ENMT: Mucous membranes are moist. Posterior pharynx clear of any exudate or lesions. Normal dentition.  Neck: normal, supple, no masses Respiratory: clear to auscultation bilaterally, no wheezing, no crackles. Normal respiratory effort. No accessory muscle use.  Cardiovascular: Regular rate and rhythm, no murmurs / rubs / gallops. No extremity edema. 2+ pedal pulses. Abdomen: no tenderness, no masses palpated. No hepatosplenomegaly. Bowel sounds positive.  Musculoskeletal: no clubbing / cyanosis. No joint deformity upper and lower extremities. Good ROM, no contractures. Normal muscle tone.  Skin: no rashes, lesions, ulcers. No induration Neurologic: CN 2-12 grossly intact. Sensation intact, DTR normal. Strength 5/5 in all 4.  Psychiatric: Odd affect. Labile mood Alert and oriented x 3.   Labs on Admission: I have personally reviewed following labs and imaging studies  CBC: Recent Labs  Lab 02/21/18 1424  WBC 8.8  HGB 15.4  HCT 47.1  MCV 94.6  PLT 270   Basic Metabolic Panel: Recent Labs  Lab 02/21/18 1424  NA 136  K 4.0  CL 103  CO2 24  GLUCOSE 119*  BUN 13  CREATININE 0.98  CALCIUM 9.5   GFR: Estimated Creatinine Clearance: 138.1 mL/min (by C-G formula based on SCr of 0.98 mg/dL). Liver Function Tests: Recent Labs  Lab 02/21/18 1424  AST 26  ALT 32  ALKPHOS 61  BILITOT 0.7  PROT 7.6  ALBUMIN 4.7   No results for input(s): LIPASE, AMYLASE in the last 168 hours. No results for input(s): AMMONIA in the last 168 hours. Coagulation Profile: No results for input(s): INR, PROTIME in the last 168 hours. Cardiac Enzymes: No results for input(s): CKTOTAL, CKMB, CKMBINDEX, TROPONINI in the last 168 hours. BNP  (last 3 results) No results for input(s): PROBNP in the last 8760 hours. HbA1C: No results for input(s): HGBA1C in the last 72 hours. CBG: No results for input(s): GLUCAP in the last 168 hours. Lipid Profile: No results for input(s): CHOL, HDL, LDLCALC, TRIG, CHOLHDL, LDLDIRECT in the last 72 hours. Thyroid Function Tests: No results for input(s): TSH, T4TOTAL, FREET4, T3FREE, THYROIDAB in the last 72 hours. Anemia Panel: No results for input(s): VITAMINB12, FOLATE, FERRITIN, TIBC, IRON, RETICCTPCT in the last 72 hours. Urine analysis:    Component Value Date/Time   COLORURINE YELLOW 04/07/2013 2329   APPEARANCEUR CLOUDY (A) 04/07/2013 2329   LABSPEC 1.031 (H) 04/07/2013 2329   PHURINE 5.5 04/07/2013 2329   GLUCOSEU NEGATIVE 04/07/2013 2329   HGBUR NEGATIVE 04/07/2013 2329   BILIRUBINUR SMALL (A) 04/07/2013 2329   KETONESUR >80 (A) 04/07/2013 2329   PROTEINUR 30 (A) 04/07/2013 2329   UROBILINOGEN 0.2 04/07/2013 2329   NITRITE NEGATIVE 04/07/2013 2329   LEUKOCYTESUR  SMALL (A) 04/07/2013 2329    Radiological Exams on Admission: Ct Head Wo Contrast  Result Date: 02/21/2018 CLINICAL DATA:  38 year old male with headache, blurred vision and dizziness for 1 day. EXAM: CT HEAD WITHOUT CONTRAST TECHNIQUE: Contiguous axial images were obtained from the base of the skull through the vertex without intravenous contrast. COMPARISON:  Head CT 07/26/2008. FINDINGS: Brain: Stable cerebral volume since 2010. No midline shift, ventriculomegaly, mass effect, evidence of mass lesion, intracranial hemorrhage or evidence of cortically based acute infarction. Gray-white matter differentiation is within normal limits throughout the brain. Vascular: No suspicious intracranial vascular hyperdensity. Skull: No acute osseous abnormality identified. Sinuses/Orbits: Visualized paranasal sinuses and mastoids are stable and well pneumatized. There is mild chronic sclerosis and coalescence of the right mastoid air  cells which is unchanged since 2010. Tympanic cavities remain clear. Other: Visualized orbits and scalp soft tissues are within normal limits. IMPRESSION: 1. Stable and negative noncontrast CT appearance of the brain. 2. Evidence of previous right mastoid inflammation, unchanged since 2010. Electronically Signed   By: Odessa FlemingH  Hall M.D.   On: 02/21/2018 21:04    Assessment/Plan Active Problems:   Alcohol withdrawal delirium, acute, hyperactive (HCC)  1. Polysubstance abuse with visual hallucinations - Psychiatry consulted in ED - Withdrawal protocol initiated - CIWA protocol - Resume home buprenorphine - Engage social services for possible placement in inpatient substance abuse program - No SI/HI, no sitter required at bedside  2. Alcohol abuse - No active signs or symptoms of alcohol withdrawal, no h/o DTs - CIWA protocol as above - Monitor for signs/sxs of alcohol withdrawal - Thiamine, folate, MVI  3. Bipolar disorder - Continue Latuda, Effexor, trazodone - Psychiatry consulted as above  4. OSA on CPAP - Continue CPAP at home settings  DVT prophylaxis: Lovenox Code Status: Full Disposition Plan: Home in 1-2 days Consults called: Psychiatry Admission status: Observation   Marcelo BaldyAdam Ross Schertz MD [facility]  If 7PM-7AM, please contact night-coverage [email or web address] Password [email or web address]  02/21/2018, 11:18 PM
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
## 2018-02-22 DIAGNOSIS — F1510 Other stimulant abuse, uncomplicated: Secondary | ICD-10-CM | POA: Diagnosis present

## 2018-02-22 DIAGNOSIS — F19232 Other psychoactive substance dependence with withdrawal with perceptual disturbance: Secondary | ICD-10-CM | POA: Diagnosis not present

## 2018-02-22 DIAGNOSIS — G4733 Obstructive sleep apnea (adult) (pediatric): Secondary | ICD-10-CM | POA: Diagnosis present

## 2018-02-22 DIAGNOSIS — F1910 Other psychoactive substance abuse, uncomplicated: Secondary | ICD-10-CM

## 2018-02-22 DIAGNOSIS — F1210 Cannabis abuse, uncomplicated: Secondary | ICD-10-CM | POA: Diagnosis present

## 2018-02-22 DIAGNOSIS — F319 Bipolar disorder, unspecified: Secondary | ICD-10-CM | POA: Diagnosis present

## 2018-02-22 DIAGNOSIS — F410 Panic disorder [episodic paroxysmal anxiety] without agoraphobia: Secondary | ICD-10-CM | POA: Diagnosis present

## 2018-02-22 DIAGNOSIS — Z79899 Other long term (current) drug therapy: Secondary | ICD-10-CM | POA: Diagnosis not present

## 2018-02-22 DIAGNOSIS — Z6835 Body mass index (BMI) 35.0-35.9, adult: Secondary | ICD-10-CM | POA: Diagnosis not present

## 2018-02-22 DIAGNOSIS — F10231 Alcohol dependence with withdrawal delirium: Secondary | ICD-10-CM | POA: Diagnosis present

## 2018-02-22 DIAGNOSIS — F4310 Post-traumatic stress disorder, unspecified: Secondary | ICD-10-CM | POA: Diagnosis present

## 2018-02-22 DIAGNOSIS — F19239 Other psychoactive substance dependence with withdrawal, unspecified: Secondary | ICD-10-CM | POA: Diagnosis present

## 2018-02-22 DIAGNOSIS — K219 Gastro-esophageal reflux disease without esophagitis: Secondary | ICD-10-CM | POA: Diagnosis present

## 2018-02-22 DIAGNOSIS — R441 Visual hallucinations: Secondary | ICD-10-CM | POA: Diagnosis present

## 2018-02-22 DIAGNOSIS — F17210 Nicotine dependence, cigarettes, uncomplicated: Secondary | ICD-10-CM | POA: Diagnosis present

## 2018-02-22 DIAGNOSIS — Z881 Allergy status to other antibiotic agents status: Secondary | ICD-10-CM | POA: Diagnosis not present

## 2018-02-22 DIAGNOSIS — F1123 Opioid dependence with withdrawal: Secondary | ICD-10-CM

## 2018-02-22 DIAGNOSIS — F1410 Cocaine abuse, uncomplicated: Secondary | ICD-10-CM | POA: Diagnosis present

## 2018-02-22 DIAGNOSIS — F909 Attention-deficit hyperactivity disorder, unspecified type: Secondary | ICD-10-CM | POA: Diagnosis present

## 2018-02-22 DIAGNOSIS — E669 Obesity, unspecified: Secondary | ICD-10-CM | POA: Diagnosis present

## 2018-02-22 DIAGNOSIS — Z9119 Patient's noncompliance with other medical treatment and regimen: Secondary | ICD-10-CM | POA: Diagnosis not present

## 2018-02-22 DIAGNOSIS — F10230 Alcohol dependence with withdrawal, uncomplicated: Secondary | ICD-10-CM | POA: Diagnosis not present

## 2018-02-22 LAB — CBC
HEMATOCRIT: 47.2 % (ref 39.0–52.0)
Hemoglobin: 15.2 g/dL (ref 13.0–17.0)
MCH: 30.8 pg (ref 26.0–34.0)
MCHC: 32.2 g/dL (ref 30.0–36.0)
MCV: 95.7 fL (ref 80.0–100.0)
PLATELETS: 222 10*3/uL (ref 150–400)
RBC: 4.93 MIL/uL (ref 4.22–5.81)
RDW: 12.1 % (ref 11.5–15.5)
WBC: 6.9 10*3/uL (ref 4.0–10.5)
nRBC: 0 % (ref 0.0–0.2)

## 2018-02-22 LAB — BASIC METABOLIC PANEL
Anion gap: 7 (ref 5–15)
BUN: 12 mg/dL (ref 6–20)
CALCIUM: 9.4 mg/dL (ref 8.9–10.3)
CHLORIDE: 102 mmol/L (ref 98–111)
CO2: 29 mmol/L (ref 22–32)
CREATININE: 1.19 mg/dL (ref 0.61–1.24)
GFR calc Af Amer: >60 mL/min (ref >60)
GFR calc non Af Amer: >60 mL/min (ref >60)
Glucose, Bld: 104 mg/dL — ABNORMAL HIGH (ref 70–99)
Potassium: 4.2 mmol/L (ref 3.5–5.1)
Sodium: 138 mmol/L (ref 135–145)

## 2018-02-22 LAB — HIV ANTIBODY (ROUTINE TESTING W REFLEX): HIV Screen 4th Generation wRfx: NONREACTIVE

## 2018-02-22 MED ORDER — ENSURE MAX PROTEIN PO LIQD
11.0000 [oz_av] | Freq: Every day | ORAL | Status: DC
  Administered 2018-02-22: 11 [oz_av] via ORAL
  Administered 2018-02-23: 237 mL via ORAL
  Administered 2018-02-24 – 2018-02-26 (×3): 11 [oz_av] via ORAL
  Filled 2018-02-22 (×5): qty 330

## 2018-02-22 MED ORDER — ACETAMINOPHEN 500 MG PO TABS
1000.0000 mg | ORAL_TABLET | Freq: Three times a day (TID) | ORAL | Status: DC
  Administered 2018-02-22 – 2018-02-26 (×12): 1000 mg via ORAL
  Filled 2018-02-22 (×14): qty 2

## 2018-02-22 MED ORDER — NICOTINE 21 MG/24HR TD PT24
21.0000 mg | MEDICATED_PATCH | Freq: Every day | TRANSDERMAL | Status: DC
  Administered 2018-02-22 – 2018-02-26 (×5): 21 mg via TRANSDERMAL
  Filled 2018-02-22 (×5): qty 1

## 2018-02-22 NOTE — Progress Notes (Signed)
PROGRESS NOTE   Steve MewBenjamin B Bass  WUJ:[id number]    DOB: 09/19/1980    DOA: 02/21/2018  PCP: Joette CatchingNyland, Leonard, MD   I have briefly reviewed patients [facility].  Brief Narrative:  38 year old male with PMH of ADD, anxiety, depression, GERD, obesity, PTSD, polysubstance abuse (meth, bath salts, cocaine, alcohol, Adderall and Focalin, THC) came to ED on 2/6 seeking detox services.  He reportedly had not slept in the past 4 days due to withdrawal symptoms.  He reported tremors, hallucinations seeing bugs, stars on the ceiling and blurred vision.  He reportedly had been injecting bath salts on the morning of admission prior to hallucinations and drinking heavily for the last few days (reporting drinking a fifth of vodka night prior).  He reported not taking his buprenorphine for 2 days because he sold the medicine for other drugs.  He was admitted for polysubstance withdrawal.   Assessment & Plan:   Active Problems:   Alcohol withdrawal delirium, acute, hyperactive (HCC)   Polysubstance abuse (HCC)   1. Polysubstance abuse (opioids, alcohol, amphetamines, cocaine, THC and rest as above) with withdrawal: Long history of multidrug abuse as noted above.  Psychiatric consulted in ED.  He was started back on his prior home dose of buprenorphine, clonidine and CIWA protocol. CIWA scores 23-24 overnight, better this morning.  Continue management and monitor closely.  Social work consultation to explore possible placement in inpatient substance abuse program.  No suicidal or homicidal ideations reported. Depending on progress, consider stopping Clonidine.  Psychiatry, patient follows with Dr. Marlyne BeardsJennings for medication management and [facility]. 2. Anxiety and depression: No suicidal or homicidal ideations.  Continue Latuda, Effexor and trazodone. 3. OSA on CPAP: Continue. 4. Obesity/Body mass index is 35.31 kg/m.    DVT  prophylaxis: Lovenox Code Status: Full Family Communication: None at bedside Disposition: Patient was initially admitted as observation.  However he has ongoing withdrawal symptoms as noted above that will require further inpatient close evaluation, monitoring and management extending beyond tonight.  Thereby changed to inpatient status.   Consultants:  Psychiatry-seen in ED.  Procedures:  None  Antimicrobials:  None   Subjective: Patient reports feeling generally sore including headache.  No visual symptoms.  No suicidal or homicidal ideations.  Some hand tremors.  ROS: As above, otherwise negative.  Objective:  Vitals:   02/22/18 0754 02/22/18 0842 02/22/18 1225 02/22/18 1414  BP: 122/82 (!) 114/91  119/68  Pulse: 74 65  63  Resp: 16 16  16   Temp: 98 F (36.7 C) 97.8 F (36.6 C)  (!) 97.5 F (36.4 C)  TempSrc: Oral Oral  Oral  SpO2:  96%  95%  Weight: 124.7 kg     Height: 6' 0.5" (1.842 m)  6\' 2"  (1.88 m)     Examination:  General exam: Young male, moderately built and obese lying comfortably supine in bed without distress.  Oral mucosa moist. Respiratory system: Clear to auscultation. Respiratory effort normal. Cardiovascular system: S1 & S2 heard, RRR. No JVD, murmurs, rubs, gallops or clicks. No pedal edema. Gastrointestinal system: Abdomen is nondistended, soft and nontender. No organomegaly or masses felt. Normal bowel sounds heard. Central nervous system: Alert and oriented. No focal neurological deficits. Extremities: Symmetric 5 x 5 power.  Mild bilateral hand tremors. Skin: No rashes, lesions or ulcers Psychiatry: Judgement and insight appear normal. Mood & affect appropriate and did not appear anxious or agitated.  Data Reviewed: I have personally reviewed following labs and imaging studies  CBC: Recent Labs  Lab 02/21/18 1424 02/22/18 0623  WBC 8.8 6.9  HGB 15.4 15.2  HCT 47.1 47.2  MCV 94.6 95.7  PLT 270 222   Basic Metabolic  Panel: Recent Labs  Lab 02/21/18 1424 02/22/18 0623  NA 136 138  K 4.0 4.2  CL 103 102  CO2 24 29  GLUCOSE 119* 104*  BUN 13 12  CREATININE 0.98 1.19  CALCIUM 9.5 9.4   Liver Function Tests: Recent Labs  Lab 02/21/18 1424  AST 26  ALT 32  ALKPHOS 61  BILITOT 0.7  PROT 7.6  ALBUMIN 4.7     Radiology Studies: Ct Head Wo Contrast  Result Date: 02/21/2018 CLINICAL DATA:  38 year old male with headache, blurred vision and dizziness for 1 day. EXAM: CT HEAD WITHOUT CONTRAST TECHNIQUE: Contiguous axial images were obtained from the base of the skull through the vertex without intravenous contrast. COMPARISON:  Head CT 07/26/2008. FINDINGS: Brain: Stable cerebral volume since 2010. No midline shift, ventriculomegaly, mass effect, evidence of mass lesion, intracranial hemorrhage or evidence of cortically based acute infarction. Gray-white matter differentiation is within normal limits throughout the brain. Vascular: No suspicious intracranial vascular hyperdensity. Skull: No acute osseous abnormality identified. Sinuses/Orbits: Visualized paranasal sinuses and mastoids are stable and well pneumatized. There is mild chronic sclerosis and coalescence of the right mastoid air cells which is unchanged since 2010. Tympanic cavities remain clear. Other: Visualized orbits and scalp soft tissues are within normal limits. IMPRESSION: 1. Stable and negative noncontrast CT appearance of the brain. 2. Evidence of previous right mastoid inflammation, unchanged since 2010. Electronically Signed   By: Odessa Fleming M.D.   On: 02/21/2018 21:04        Scheduled Meds: . acetaminophen  1,000 mg Oral TID  . buprenorphine  8 mg Sublingual TID  . cloNIDine  0.1 mg Oral QID   Followed by  . [START ON 02/23/2018] cloNIDine  0.1 mg Oral BID   Followed by  . [START ON 02/26/2018] cloNIDine  0.1 mg Oral Daily  . enoxaparin (LOVENOX) injection  40 mg Subcutaneous Q24H  . folic acid  1 mg Oral Daily  . gabapentin   600 mg Oral TID  . LORazepam  0-4 mg Intravenous Q6H   Or  . LORazepam  0-4 mg Oral Q6H  . [START ON 02/24/2018] LORazepam  0-4 mg Intravenous Q12H   Or  . [START ON 02/24/2018] LORazepam  0-4 mg Oral Q12H  . lurasidone  40 mg Oral Q breakfast  . multivitamin with minerals  1 tablet Oral Daily  . nicotine  21 mg Transdermal Once  . pantoprazole  40 mg Oral Daily  . propranolol ER  160 mg Oral BID  . ENSURE MAX PROTEIN  11 oz Oral Daily  . thiamine  100 mg Oral Daily   Or  . thiamine  100 mg Intravenous Daily  . traZODone  300 mg Oral QHS  . venlafaxine XR  150 mg Oral Q breakfast   Continuous Infusions:   LOS: 1 day     Marcellus Scott, MD, FACP, [facility]. [facility]  To contact the attending provider between 7A-7P or the covering provider during after hours 7P-7A, please log into the web site [email or web address] and access using universal [place] password for that web site. If you do not have the password, please call the hospital operator.  02/22/2018, 4:27 PM

## 2018-02-22 NOTE — Progress Notes (Signed)
Nutrition Follow-up  DOCUMENTATION CODES:   Obesity unspecified  INTERVENTION:   Ensure MAX Protein po once daily, each supplement provides 150 kcal and 30 grams of protein  NUTRITION DIAGNOSIS:   Inadequate oral intake related to social / environmental circumstances(drug abuse) as evidenced by per patient/family report.  GOAL:   Patient will meet greater than or equal to 90% of their needs  MONITOR:   PO intake, Supplement acceptance, Labs, Weight trends  REASON FOR ASSESSMENT:   Malnutrition Screening Tool    ASSESSMENT:   Patient with PMH significant for bipolar disorder, ADD, PTSD, and polysubstance abuse (meth, bath salts, cocaine, alcohol). Presents this admission with hallucinations after multidrug abuse.    Pt endorses having a loss in appetite over the last four days after injecting bath salts. States during this time he would eat one peanut butter and jelly sandwich daily. Prior to this, he ate 3 meals with snacks daily. Pt reports losing 40-60 lb within the last week. He suspects this is related to his increase in activity (running with his dog). Records are limited in weight history over the last year but suspect this a fabrication. UBW reported as 225 lb.   Meal completion charted as 100% for pt's last meal. Will send supplementation once daily per pt request.   Medications reviewed and include: folic acid, MVI with minerals Labs reviewed.   NUTRITION - FOCUSED PHYSICAL EXAM:    Most Recent Value  Orbital Region  No depletion  Upper Arm Region  No depletion  Thoracic and Lumbar Region  Unable to assess  Buccal Region  No depletion  Temple Region  No depletion  Clavicle Bone Region  No depletion  Clavicle and Acromion Bone Region  No depletion  Scapular Bone Region  Unable to assess  Dorsal Hand  No depletion  Patellar Region  No depletion  Anterior Thigh Region  No depletion  Posterior Calf Region  No depletion  Edema (RD Assessment)  None     Diet  Order:   Diet Order            Diet regular Room service appropriate? Yes; Fluid consistency: Thin  Diet effective now              EDUCATION NEEDS:   Education needs have been addressed  Skin:  Skin Assessment: Reviewed RN Assessment  Last BM:  2/6  Height:   Ht Readings from Last 1 Encounters:  02/22/18 6\' 2"  (1.88 m)    Weight:   Wt Readings from Last 1 Encounters:  02/22/18 124.7 kg    Ideal Body Weight:  86.4 kg  BMI:  Body mass index is 35.31 kg/m.  Estimated Nutritional Needs:   Kcal:  2200-2400 kcal  Protein:  110-125 grams  Fluid:  >/= 2.2 L/day   Vanessa Kick RD, LDN Clinical Nutrition Pager # - [phone]

## 2018-02-23 MED ORDER — LORAZEPAM 1 MG PO TABS
1.0000 mg | ORAL_TABLET | Freq: Four times a day (QID) | ORAL | Status: DC | PRN
  Administered 2018-02-23 – 2018-02-24 (×3): 1 mg via ORAL
  Filled 2018-02-23 (×3): qty 1

## 2018-02-23 NOTE — Clinical Social Work Note (Signed)
Clinical Social Work Assessment  Patient Details  Name: Steve Bass MRN: [id number] Date of Birth: 04/17/1980  Date of referral:  02/23/18               Reason for consult:  Substance Use/ETOH Abuse                Permission sought to share information with:  Facility Medical sales representativeContact Representative Permission granted to share information::     Name::     Rockney GheeMartha Namba  Agency::     Relationship::  Mother  Contact Information:  [phone]  Housing/Transportation Living arrangements for the past 2 months:  Single Family Home Source of Information:  Parent Patient Interpreter Needed:  None Criminal Activity/Legal Involvement Pertinent to Current Situation/Hospitalization:  No - Comment as needed Significant Relationships:  Parents Lives with:  Parents Do you feel safe going back to the place where you live?  Yes Need for family participation in patient care:  No (Coment)  Care giving concerns:  Patient w/ PMH of ADD, anxiety, depression, GERD, obesity, PTSD, polysubstance abuse (meth, bath salts, cocaine, alcohol, Adderall and Focalin, THC) came to ED on 2/6 seeking detox services. Currently lives with parents who are hopeful for patient to enter into inpatient substance treatment program.   Social Worker assessment / plan:  CSW spoke with patient's mother, Johnny BridgeMartha, at her request regarding request for treatment resources.  Patient's mother reports that patient has extensive history with substance abuse and has been in inpatient rehabilitation programs approx 5 times since he was 38 years old. Including[facility], and [facility].  Mother states that patient discussed wanting to enter into treatment again prior to admitting to hospital. Patient's mother reports she has spoken with [facility] who has told her they will likely be able to admit patient at discharge. Patient [facility] and liked the therapists he worked with there. Patient's  mother requested CSW provide her with a list of additional SA tx facilities incase [facility] is not an option.   Mother said that since being in hospital, parents have not been able to ask patient if he is still interested in entering treatment. CSW encouraged them to discuss this with patient.  CSW provided list of area inpatient substance use treatment options for parents to review. CSW did not speak directly with patient and did not release any confidential medical information.   CSW will continue to follow in case assistance is needed at d/c.   Employment status:    Insurance information:  Managed Care PT Recommendations:  Not assessed at this time Information / Referral to community resources:  Residential Substance Abuse Treatment Options, Outpatient Substance Abuse Treatment Options  Patient/Family's Response to care:  Family is very supportive of patient's sobriety efforts and want to find the best rehab option for him.   Patient/Family's Understanding of and Emotional Response to Diagnosis, Current Treatment, and Prognosis:  Patient's family understands patient's extensive SA hx and have been provided with available resources for tx.   Emotional Assessment Appearance:  (Did not speak with patient.) Attitude/Demeanor/Rapport:  Unable to Assess(Did not speak with patient.) Affect (typically observed):  Unable to Assess Orientation:  Oriented to Self, Oriented to Place, Oriented to  Time, Oriented to Situation Alcohol / Substance use:  Illicit Drugs, Alcohol Use, Tobacco Use Psych involvement (Current and /or in the community):  Yes (Comment)(Dr. Marlyne BeardsJennings for medication management and receives counseling [facility])  Discharge  Needs  Concerns to be addressed:    Readmission within the last 30 days:  No Current discharge risk:  Substance Abuse Barriers to Discharge:  Active Substance Use   Enid CutterLindsey Naelani Lafrance, LCSWA 02/23/2018, 5:02 PM

## 2018-02-23 NOTE — Progress Notes (Signed)
Please note the following phone numbers for patient's mother: Cell -- [phone] Home -- [phone]

## 2018-02-23 NOTE — Progress Notes (Signed)
PROGRESS NOTE   Roxanna MewBenjamin B Corson  ZOX:[id number]    DOB: 09/22/1980    DOA: 02/21/2018  PCP: Joette CatchingNyland, Leonard, MD   I have briefly reviewed patients previous medical records [facility].  Brief Narrative:  38 year old male with PMH of ADD, anxiety, depression, GERD, obesity, PTSD, polysubstance abuse (meth, bath salts, cocaine, alcohol, Adderall and Focalin, THC) came to ED on 2/6 seeking detox services.  He reportedly had not slept in the past 4 days due to withdrawal symptoms.  He reported tremors, hallucinations seeing bugs, stars on the ceiling and blurred vision.  He reportedly had been injecting bath salts on the morning of admission prior to hallucinations and drinking heavily for the last few days (reporting drinking a fifth of vodka night prior).  He reported not taking his buprenorphine for 2 days because he sold the medicine for other drugs.  He was admitted for polysubstance withdrawal.  Slowly improving.   Assessment & Plan:   Active Problems:   Alcohol withdrawal delirium, acute, hyperactive (HCC)   Polysubstance abuse (HCC)   1. Polysubstance abuse (opioids, alcohol, amphetamines, cocaine, THC and rest as above) with withdrawal: Long history of multidrug abuse as noted above.  Psychiatric consulted in ED.  He was started back on his prior home dose of buprenorphine, clonidine and CIWA protocol. CIWA scores better since last night, 13 > 7.  Continue management and monitor closely.  Social work consultation to explore possible placement in inpatient substance abuse program.  No suicidal or homicidal ideations reported.  Patient reports that he will [facility] upon discharge regarding Subutex prescription.  Discontinued clonidine.  Psychiatry, patient follows [facility]Glen [facility] for [facility] management and he receives [facility]. 2. Anxiety and depression: No  suicidal or homicidal ideations.  Continue Latuda, Effexor and trazodone.  Patient states that he used to be on propranolol for panic attacks but has not taken in several weeks, discontinued.  He does take Klonopin at home.  Also on gabapentin for mood. 3. OSA on CPAP: Continue. 4. Obesity/Body mass index is 35.31 kg/m.    DVT prophylaxis: Lovenox Code Status: Full Family Communication: None at bedside Disposition: DC home pending clinical improvement, possibly 2/10.   Consultants:  Psychiatry-seen in ED.  Procedures:  None  Antimicrobials:  None   Subjective: Patient interviewed and examined along with RN.  Reports feeling better generally.  No pains reported.  Anxiety and tremors are less.  No nausea or vomiting.  ROS: As above, otherwise negative.  Objective:  Vitals:   02/22/18 1414 02/22/18 2102 02/22/18 2233 02/23/18 0358  BP: 119/68 109/63  108/74  Pulse: 63 63  (!) 55  Resp: 16 18 16 16   Temp: (!) 97.5 F (36.4 C) 97.6 F (36.4 C)  97.8 F (36.6 C)  TempSrc: Oral Oral    SpO2: 95% 94%  98%  Weight:      Height:        Examination:  General exam: Young male, moderately built and obese lying comfortably supine in bed without distress.  Oral mucosa moist.  Stable Respiratory system: Clear to auscultation. Respiratory effort normal.  Stable Cardiovascular system: S1 & S2 heard, RRR. No JVD, murmurs, rubs, gallops or clicks. No pedal edema.  Stable Gastrointestinal system: Abdomen is nondistended, soft and nontender. No organomegaly or masses felt. Normal bowel sounds heard.  Stable Central nervous system: Alert and oriented. No focal neurological deficits.  No change/stable Extremities: Symmetric 5 x 5 power.  Less hand tremors today. Skin: No rashes, lesions or ulcers Psychiatry: Judgement and insight appear normal. Mood & affect appropriate and did not appear anxious or agitated.     Data Reviewed: I have personally reviewed following labs and imaging  studies  CBC: Recent Labs  Lab 02/21/18 1424 02/22/18 0623  WBC 8.8 6.9  HGB 15.4 15.2  HCT 47.1 47.2  MCV 94.6 95.7  PLT 270 222   Basic Metabolic Panel: Recent Labs  Lab 02/21/18 1424 02/22/18 0623  NA 136 138  K 4.0 4.2  CL 103 102  CO2 24 29  GLUCOSE 119* 104*  BUN 13 12  CREATININE 0.98 1.19  CALCIUM 9.5 9.4   Liver Function Tests: Recent Labs  Lab 02/21/18 1424  AST 26  ALT 32  ALKPHOS 61  BILITOT 0.7  PROT 7.6  ALBUMIN 4.7     Radiology Studies: Ct Head Wo Contrast  Result Date: 02/21/2018 CLINICAL DATA:  38 year old male with headache, blurred vision and dizziness for 1 day. EXAM: CT HEAD WITHOUT CONTRAST TECHNIQUE: Contiguous axial images were obtained from the base of the skull through the vertex without intravenous contrast. COMPARISON:  Head CT 07/26/2008. FINDINGS: Brain: Stable cerebral volume since 2010. No midline shift, ventriculomegaly, mass effect, evidence of mass lesion, intracranial hemorrhage or evidence of cortically based acute infarction. Gray-white matter differentiation is within normal limits throughout the brain. Vascular: No suspicious intracranial vascular hyperdensity. Skull: No acute osseous abnormality identified. Sinuses/Orbits: Visualized paranasal sinuses and mastoids are stable and well pneumatized. There is mild chronic sclerosis and coalescence of the right mastoid air cells which is unchanged since 2010. Tympanic cavities remain clear. Other: Visualized orbits and scalp soft tissues are within normal limits. IMPRESSION: 1. Stable and negative noncontrast CT appearance of the brain. 2. Evidence of previous right mastoid inflammation, unchanged since 2010. Electronically Signed   By: Odessa Fleming M.D.   On: 02/21/2018 21:04        Scheduled Meds: . acetaminophen  1,000 mg Oral TID  . buprenorphine  8 mg Sublingual TID  . cloNIDine  0.1 mg Oral QID   Followed by  . cloNIDine  0.1 mg Oral BID   Followed by  . [START ON  02/26/2018] cloNIDine  0.1 mg Oral Daily  . enoxaparin (LOVENOX) injection  40 mg Subcutaneous Q24H  . folic acid  1 mg Oral Daily  . gabapentin  600 mg Oral TID  . [START ON 02/24/2018] LORazepam  0-4 mg Intravenous Q12H   Or  . [START ON 02/24/2018] LORazepam  0-4 mg Oral Q12H  . lurasidone  40 mg Oral Q breakfast  . multivitamin with minerals  1 tablet Oral Daily  . nicotine  21 mg Transdermal Daily  . pantoprazole  40 mg Oral Daily  . propranolol ER  160 mg Oral BID  . ENSURE MAX PROTEIN  11 oz Oral Daily  . thiamine  100 mg Oral Daily   Or  . thiamine  100 mg Intravenous Daily  . traZODone  300 mg Oral QHS  . venlafaxine XR  150 mg Oral Q breakfast   Continuous Infusions:   LOS: 2 days     Marcellus Scott, MD, FACP, [facility]. [facility]  To contact the attending provider between 7A-7P or the covering provider during after hours 7P-7A, please log into the web site [email or web address] and access using universal  password for that web site. If you do not  have the password, please call the hospital operator.  02/23/2018, 2:12 PM

## 2018-02-24 NOTE — Consult Note (Signed)
[facility] Face-to-Face Psychiatry Consult   Reason for Consult:  ''anxiety, depression and admit to [facility]'' Referring Physician:  Dr. Waymon Amato Patient Identification: Steve Bass MRN:  [id number] Principal Diagnosis: Substance abuse withdrawal, with perceptual disturbance (HCC) Diagnosis:  Principal Problem:   Substance abuse withdrawal, with perceptual disturbance (HCC) Active Problems:   Alcohol withdrawal delirium, acute, hyperactive (HCC)   Polysubstance abuse (HCC)   Total Time spent with patient: 45 minutes  Subjective:   Steve Bass is a 38 y.o. male patient admitted with tactile hallucination and drug withdrawal  HPI: 38 year old man who reports long history of polysubstance dependence, Anxiety, depression, ADHD, PTSD, GERD and Obesity. Patient reports that he went on drug binges and did not sleep for at least 4 days before he presented himself to the hospital seeking for detox due to withdrawal from illicit drugs and tactile hallucinations-seeing bugs crawling on his skin. He reports non-compliant with his regular medications. His urine was positive for Cocaine, Benzo, THC, Amphetamine on admission and  has since been placed on detox protocol. Today, patient denies psychosis, delusions, suicidal thoughts but reports mild anxiety and depressive symptoms. He also reports that he is not interested in psychiatric inpatient admission and has not made up his mind if he wants drug and alcohol rehabilitation.  Past Psychiatric History: as above  Risk to Self: Suicidal Ideation: No Suicidal Intent: No Is patient at risk for suicide?: No Suicidal Plan?: No Access to Means: No What has been your use of drugs/alcohol within the last 12 months?: none How many times?: (none) Other Self Harm Risks: none Triggers for Past Attempts: None known Intentional Self Injurious Behavior: None Risk to Others: Homicidal Ideation: No Thoughts of Harm to Others: No Current Homicidal Intent:  No Current Homicidal Plan: No Access to Homicidal Means: No Identified Victim: none History of harm to others?: No Assessment of Violence: None Noted Violent Behavior Description: none Does patient have access to weapons?: No Criminal Charges Pending?: No Does patient have a court date: No Prior Inpatient Therapy: Prior Inpatient Therapy: Yes Prior Therapy Dates: 2015 Prior Therapy Facilty/Provider(s): [facility] Reason for Treatment: depression and drugs Prior Outpatient Therapy: Prior Outpatient Therapy: Yes Prior Therapy Dates: active Prior Therapy Facilty/Provider(s): [facility] Dr Marlyne Beards Reason for Treatment: anxiety and SA use Does patient have an ACCT team?: No Does patient have Intensive In-House Services?  : No Does patient have [facility] services? : No Does patient have P4CC services?: No  Past Medical History:  Past Medical History:  Diagnosis Date  . ADD (attention deficit disorder with hyperactivity)   . Anxiety and depression   . GERD (gastroesophageal reflux disease)    LA classification Grade C EE  . Obesity   . PTSD (post-traumatic stress disorder)   . Shingles     Past Surgical History:  Procedure Laterality Date  . WISDOM TOOTH EXTRACTION     Family History:  Family History  Adopted: Yes  Problem Relation Age of Onset  . Diabetes Paternal Grandfather    Family Psychiatric  History:  Social History:  Social History   Substance and Sexual Activity  Alcohol Use No   Comment: patient states he quit drinking 09/24/13     Social History   Substance and Sexual Activity  Drug Use No  . Types: "Crack" cocaine, Marijuana, Oxycodone, Cocaine   Comment: heroine, percocets-patient states he quit 09/24/13    Social History   Socioeconomic History  . Marital status: Single  Spouse name: Not on file  . Number of children: 0  . Years of education: Not on file  . Highest education level: Not on file  Occupational History  . Occupation: Consulting civil engineerstudent   Social Needs  . Financial resource strain: Not on file  . Food insecurity:    Worry: Not on file    Inability: Not on file  . Transportation needs:    Medical: Not on file    Non-medical: Not on file  Tobacco Use  . Smoking status: Current Some Day Smoker    Packs/day: 0.50    Years: 15.00    Pack years: 7.50    Types: Cigarettes    Last attempt to quit: 01/07/2013    Years since quitting: 5.1  . Smokeless tobacco: Never Used  . Tobacco comment: tobacco handout given 09/26/16  Substance and Sexual Activity  . Alcohol use: No    Comment: patient states he quit drinking 09/24/13  . Drug use: No    Types: "Crack" cocaine, Marijuana, Oxycodone, Cocaine    Comment: heroine, percocets-patient states he quit 09/24/13  . Sexual activity: Yes    Birth control/protection: Condom  Lifestyle  . Physical activity:    Days per week: Not on file    Minutes per session: Not on file  . Stress: Not on file  Relationships  . Social connections:    Talks on phone: Not on file    Gets together: Not on file    Attends religious service: Not on file    Active member of club or organization: Not on file    Attends meetings of clubs or organizations: Not on file    Relationship status: Not on file  Other Topics Concern  . Not on file  Social History Narrative  . Not on file   Additional Social History:    Allergies:   Allergies  Allergen Reactions  . Cefaclor Rash    Labs: No results found for this or any previous visit (from the past 48 hour(s)).  Current Facility-Administered Medications  Medication Dose Route Frequency Provider Last Rate Last Dose  . acetaminophen (TYLENOL) tablet 1,000 mg  1,000 mg Oral TID Marcellus ScottHongalgi, Anand D, MD   1,000 mg at 02/24/18 [id number]  . buprenorphine (SUBUTEX) sublingual tablet 8 mg  8 mg Sublingual TID Marcelo BaldySchertz, Adam Ross, MD   8 mg at 02/24/18 [id number]  . dicyclomine (BENTYL) tablet 20 mg  20 mg Oral Q6H PRN Marcelo BaldySchertz, Adam Ross, MD      . enoxaparin (LOVENOX)  injection 40 mg  40 mg Subcutaneous Q24H Marcelo BaldySchertz, Adam Ross, MD   40 mg at 02/23/18 2139  . folic acid (FOLVITE) tablet 1 mg  1 mg Oral Daily Marcelo BaldySchertz, Adam Ross, MD   1 mg at 02/24/18 0951  . gabapentin (NEURONTIN) capsule 600 mg  600 mg Oral TID Marcelo BaldySchertz, Adam Ross, MD   600 mg at 02/24/18 [id number]  . hydrOXYzine (ATARAX/VISTARIL) tablet 25 mg  25 mg Oral Q6H PRN Marcelo BaldySchertz, Adam Ross, MD   25 mg at 02/24/18 0536  . loperamide (IMODIUM) capsule 2-4 mg  2-4 mg Oral PRN Marcelo BaldySchertz, Adam Ross, MD      . LORazepam (ATIVAN) injection 0-4 mg  0-4 mg Intravenous Q12H Marcelo BaldySchertz, Adam Ross, MD   1 mg at 02/24/18 0026   Or  . LORazepam (ATIVAN) tablet 0-4 mg  0-4 mg Oral Q12H Schertz, Shari HeritageAdam Ross, MD      . LORazepam (ATIVAN) tablet 1 mg  1 mg  Oral Q6H PRN Elease Etienne, MD   1 mg at 02/24/18 0755  . lurasidone (LATUDA) tablet 40 mg  40 mg Oral Q breakfast Marcelo Baldy, MD   40 mg at 02/24/18 0755  . multivitamin with minerals tablet 1 tablet  1 tablet Oral Daily Marcelo Baldy, MD   1 tablet at 02/24/18 4098  . naproxen (NAPROSYN) tablet 500 mg  500 mg Oral BID PRN Marcelo Baldy, MD   500 mg at 02/24/18 0755  . nicotine (NICODERM CQ - dosed in mg/24 hours) patch 21 mg  21 mg Transdermal Daily Blount, Andi Devon T, NP   21 mg at 02/24/18 0953  . ondansetron (ZOFRAN-ODT) disintegrating tablet 4 mg  4 mg Oral Q6H PRN Marcelo Baldy, MD      . pantoprazole (PROTONIX) EC tablet 40 mg  40 mg Oral Daily Marcelo Baldy, MD   40 mg at 02/24/18 0951  . protein supplement (ENSURE MAX) liquid  11 oz Oral Daily Elease Etienne, MD   11 oz at 02/24/18 0951  . thiamine (VITAMIN B-1) tablet 100 mg  100 mg Oral Daily Marcelo Baldy, MD   100 mg at 02/24/18 1191   Or  . thiamine (B-1) injection 100 mg  100 mg Intravenous Daily Marcelo Baldy, MD   100 mg at 02/21/18 1957  . traZODone (DESYREL) tablet 300 mg  300 mg Oral QHS Marcelo Baldy, MD   300 mg at 02/23/18 2139  . venlafaxine XR (EFFEXOR-XR) 24  hr capsule 150 mg  150 mg Oral Q breakfast Marcelo Baldy, MD   150 mg at 02/24/18 4782    Musculoskeletal: Strength & Muscle Tone: within normal limits Gait & Station: normal Patient leans: N/A  Psychiatric Specialty Exam: Physical Exam  Psychiatric: Thought content normal. His mood appears anxious. His speech is delayed and slurred. He is slowed and withdrawn. Cognition and memory are normal. He expresses impulsivity. He exhibits a depressed mood.    Review of Systems  Constitutional: Positive for malaise/fatigue.  HENT: Negative.   Eyes: Negative.   Respiratory: Negative.   Cardiovascular: Negative.   Gastrointestinal: Positive for nausea.  Genitourinary: Negative.   Musculoskeletal: Negative.   Skin: Negative.   Neurological: Positive for tremors.  Endo/Heme/Allergies: Negative.   Psychiatric/Behavioral: Positive for depression and substance abuse. The patient is nervous/anxious.     Blood pressure 121/71, pulse 80, temperature 98.2 F (36.8 C), temperature source Oral, resp. rate 18, height 6\' 2"  (1.88 m), weight 124.7 kg, SpO2 96 %.Body mass index is 35.31 kg/m.  General Appearance: Casual  Eye Contact:  Fair  Speech:  Clear and Coherent  Volume:  Decreased  Mood:  Anxious  Affect:  Constricted  Thought Process:  Coherent and Linear  Orientation:  Full (Time, Place, and Person)  Thought Content:  Logical  Suicidal Thoughts:  No  Homicidal Thoughts:  No  Memory:  Immediate;   Fair Recent;   Fair Remote;   Fair  Judgement:  Other:  marginal  Insight:  marginal  Psychomotor Activity:  Psychomotor Retardation  Concentration:  Concentration: Fair and Attention Span: Fair  Recall:  Good  Fund of Knowledge:  Good  Language:  Good  Akathisia:  No  Handed:  Right  AIMS (if indicated):     Assets:  Communication Skills Desire for Improvement  ADL's:  Intact  Cognition:  WNL  Sleep:   fair     Treatment Plan Summary: Recommendations: -Continue Ativan  detox protocol. -Continue antipsychotic and antidepressant medications -Consider social worker consult to facilitate referral to drug and Alcohol rehabilitation center after patient is medically cleared. -Psychiatric service signing out. Re-consult as needed.   Disposition: Recommend referral to drug rehabilitation center when medically cleared. Supportive therapy provided about ongoing stressors.  Thedore Mins, MD 02/24/2018 3:44 PM

## 2018-02-24 NOTE — Progress Notes (Signed)
PROGRESS NOTE   Steve Bass  ZOX:[id number]    DOB: 09/16/1980    DOA: 02/21/2018  PCP: Dione Housekeeper, MD   I have briefly reviewed patients previous medical records in [facility].  Brief Narrative:  38 year old male with PMH of ADD, anxiety, depression, GERD, obesity, PTSD, polysubstance abuse (meth, bath salts, cocaine, alcohol, Adderall and Focalin, THC) came to ED on 2/6 seeking detox services.  He reportedly had not slept in the past 4 days due to withdrawal symptoms.  He reported tremors, hallucinations seeing bugs, stars on the ceiling and blurred vision.  He reportedly had been injecting bath salts on the morning of admission prior to hallucinations and drinking heavily for the last few days (reporting drinking a fifth of vodka night prior).  He reported not taking his buprenorphine for 2 days because he sold the medicine for other drugs.  He was admitted for polysubstance withdrawal.  Continues to gradually improve.   Assessment & Plan:   Principal Problem:   Substance abuse withdrawal, with perceptual disturbance (HCC) Active Problems:   Alcohol withdrawal delirium, acute, hyperactive ([place])   Polysubstance abuse ([place])   1. Polysubstance abuse (opioids, alcohol, amphetamines, cocaine, THC and rest as above) with withdrawal: Long history of multidrug abuse as noted above.  Psychiatric consulted in ED.  He was started back on his prior home dose of buprenorphine, clonidine and CIWA protocol. CIWA scores not being adequately charted but as per RN, withdrawal symptoms continue to improve. Social work consultation to explore possible placement in inpatient substance abuse program.  No suicidal or homicidal ideations reported.  Patient reports that he will follow-up with [facility] upon discharge regarding Subutex prescription.  Discontinued clonidine.  Psychiatry, patient follows with Dr. Domenick Gong at [facility] for medication  management and he receives counseling at [facility].  Patient's parents wanted to discuss his care and with his permission met in his room.  Upon discharge from the hospital, parents would like him to go to residential substance rehab program, they have explored some options but would like to also consult [facility] for same.  Patient himself is not sure and wants more time to decide.  Continue management.  Improving. 2. Anxiety and depression: No suicidal or homicidal ideations.  Continue Latuda, Effexor and trazodone.  Patient states that he used to be on propranolol for panic attacks but has not taken in several weeks, discontinued.  He does take Klonopin at home.  Also on gabapentin for mood. 3. OSA on CPAP: Continue. 4. Obesity/Body mass index is 35.31 kg/m.    DVT prophylaxis: Lovenox Code Status: Full Family Communication: Discussed in detail with patient's parents at bedside after patient consented. Disposition: DC home pending clinical improvement, possibly 2/10.  Patient/family exploring options of residential substance abuse rehab programs at discharge.   Consultants:  Psychiatry-seen in ED. Consulted psychiatry again on 2/9  Procedures:  None  Antimicrobials:  None   Subjective: Patient states that he continues to improve.  Still has some anxiety and tremors at times.  As per RN, asked for anxiety medications but noted to be drowsy many times.  Block  ROS: As above, otherwise negative.  Objective:  Vitals:   02/23/18 2029 02/24/18 0028 02/24/18 0534 02/24/18 1419  BP: 140/73 120/87 121/71 121/71  Pulse: 75 82 86 80  Resp: _0 Temp: 98.6 F (37 C) 98.3 F (36.8 C) 98.6 F (37 C) 98.2 F (36.8 C)  TempSrc: Oral Oral Oral Oral  SpO2: 93% 95% 95% 96%  Weight:      Height:        Examination:  General exam: Young male, moderately built and obese lying comfortably supine in bed without distress.  Oral mucosa moist.  Stable Respiratory  system: Clear to auscultation.  No increased work of breathing. Cardiovascular system: S1 & S2 heard, RRR. No JVD, murmurs, rubs, gallops or clicks. No pedal edema.  Stable Gastrointestinal system: Abdomen is nondistended, soft and nontender. No organomegaly or masses felt. Normal bowel sounds heard.  Stable Central nervous system: Alert and oriented x3.  No focal neurological deficits.  Extremities: Symmetric 5 x 5 power.  Minimal hand tremors. Skin: No rashes, lesions or ulcers Psychiatry: Judgement and insight appear normal. Mood & affect appropriate and did not appear anxious.     Data Reviewed: I have personally reviewed following labs and imaging studies  CBC: Recent Labs  Lab 02/21/18 1424 02/22/18 0623  WBC 8.8 6.9  HGB 15.4 15.2  HCT 47.1 47.2  MCV 94.6 95.7  PLT 270 292   Basic Metabolic Panel: Recent Labs  Lab 02/21/18 1424 02/22/18 0623  NA 136 138  K 4.0 4.2  CL 103 102  CO2 24 29  GLUCOSE 119* 104*  BUN 13 12  CREATININE 0.98 1.19  CALCIUM 9.5 9.4   Liver Function Tests: Recent Labs  Lab 02/21/18 1424  AST 26  ALT 32  ALKPHOS 61  BILITOT 0.7  PROT 7.6  ALBUMIN 4.7     Radiology Studies: No results found.      Scheduled Meds: . acetaminophen  1,000 mg Oral TID  . buprenorphine  8 mg Sublingual TID  . enoxaparin (LOVENOX) injection  40 mg Subcutaneous Q24H  . folic acid  1 mg Oral Daily  . gabapentin  600 mg Oral TID  . LORazepam  0-4 mg Intravenous Q12H   Or  . LORazepam  0-4 mg Oral Q12H  . lurasidone  40 mg Oral Q breakfast  . multivitamin with minerals  1 tablet Oral Daily  . nicotine  21 mg Transdermal Daily  . pantoprazole  40 mg Oral Daily  . ENSURE MAX PROTEIN  11 oz Oral Daily  . thiamine  100 mg Oral Daily   Or  . thiamine  100 mg Intravenous Daily  . traZODone  300 mg Oral QHS  . venlafaxine XR  150 mg Oral Q breakfast   Continuous Infusions:   LOS: 3 days     Vernell Leep, MD, FACP, [facility]. [facility]  To contact the attending provider between 7A-7P or the covering provider during after hours 7P-7A, please log into the web site [email or web address] and access using universal [place] password for that web site. If you do not have the password, please call the hospital operator.  02/24/2018, 3:49 PM

## 2018-02-25 MED ORDER — CLONAZEPAM 1 MG PO TABS
2.0000 mg | ORAL_TABLET | Freq: Two times a day (BID) | ORAL | Status: DC | PRN
  Administered 2018-02-25 (×2): 2 mg via ORAL
  Filled 2018-02-25 (×2): qty 2

## 2018-02-25 NOTE — Progress Notes (Signed)
LCSW following for SA.   LCSW met at bedside with patient. Patient is not agreeable to treatment at dc.   Patient reports that he has been to several programs in the past and feels that he has enough knowledge on his on. Patient states that they have has other financial obligations at home and would rather not spend money on rehab.   Patient is currently a full time Chartered certified accountant and lives at home with his parents.   LCSW signing off. No CSW needs. Please submit new consult if new CSW needs arise.   Carolin Coy Philo Long [place]

## 2018-02-25 NOTE — Progress Notes (Signed)
PROGRESS NOTE   Steve Bass  NOB:[id number]    DOB: 06-16-1980    DOA: 02/21/2018  PCP: Joette Catching, MD   I have briefly reviewed patients previous medical records in [facility].  Brief Narrative:  38 year old male with PMH of ADD, anxiety, depression, GERD, obesity, PTSD, polysubstance abuse (meth, bath salts, cocaine, alcohol, Adderall and Focalin, THC) came to ED on 2/6 seeking detox services.  He reportedly had not slept in the past 4 days due to withdrawal symptoms.  He reported tremors, hallucinations seeing bugs, stars on the ceiling and blurred vision.  He reportedly had been injecting bath salts on the morning of admission prior to hallucinations and drinking heavily for the last few days (reporting drinking a fifth of vodka night prior).  He reported not taking his buprenorphine for 2 days because he sold the medicine for other drugs.  He was admitted for polysubstance withdrawal.  Continues to gradually improve and hopefully should be medically stable for discharge home on 2/11.  Patient declines residential substance abuse rehab.   Assessment & Plan:   Principal Problem:   Substance abuse withdrawal, with perceptual disturbance (HCC) Active Problems:   Alcohol withdrawal delirium, acute, hyperactive (HCC)   Polysubstance abuse (HCC)   1. Polysubstance abuse (opioids, alcohol, amphetamines, cocaine, THC and rest as above) with withdrawal: Long history of multidrug abuse as noted above.  Psychiatric consulted in ED.  He was started back on his prior home dose of buprenorphine, clonidine and CIWA protocol. No suicidal or homicidal ideations reported.  Patient reports that he will follow-up with [facility] upon discharge regarding Subutex prescription.  Discontinued clonidine.  Psychiatry, patient follows with Dr. Shelba Flake at [facility] for medication management and he receives counseling at [facility].   Patient's parents were eager for him to going to residential substance rehab program upon hospital discharge. However patient today indicates that he is not interested in going to a residential program.  Clinical social work has provided him with outpatient substance abuse resources. CIWA scores gradually improving (13 > 9 > 8).  Resumed home dose of clonazepam.  Gradually improving.  Psychiatry input 2/9 appreciated and had no additional recommendations. 2. Anxiety and depression: No suicidal or homicidal ideations.  Continue Latuda, Effexor and trazodone.  Patient states that he used to be on propranolol for panic attacks but has not taken in several weeks, discontinued.  He does take Klonopin at home.  Also on gabapentin for mood. 3. OSA on CPAP: Continue. 4. Obesity/Body mass index is 35.31 kg/m.    DVT prophylaxis: Lovenox Code Status: Full Family Communication: None at bedside today. Disposition: DC home pending clinical improvement, possibly 2/11.    Consultants:  Psychiatry-seen in ED. Consulted psychiatry again on 2/9  Procedures:  None  Antimicrobials:  None   Subjective: Continues to feel better.  Mild intermittent anxiety.  Tremors have almost resolved.  ROS: As above, otherwise negative.  Objective:  Vitals:   02/24/18 2317 02/25/18 0033 02/25/18 0539 02/25/18 1405  BP:  132/64 117/63 (!) 150/78  Pulse:  65 73 72  Resp: 18 18 19 18   Temp:  98.9 F (37.2 C) 98 F (36.7 C) 98.2 F (36.8 C)  TempSrc:  Oral Oral   SpO2:  100% 97% 94%  Weight:      Height:        Examination:  General exam: Young male, moderately built and obese lying comfortably in bed.  Looks much improved compared to 2/7 Respiratory system: Clear to auscultation.  No increased work of breathing.  Stable Cardiovascular system: S1 & S2 heard, RRR. No JVD, murmurs, rubs, gallops or clicks. No pedal edema.  Stable Gastrointestinal system: Abdomen is nondistended, soft and nontender. No  organomegaly or masses felt. Normal bowel sounds heard.  Stable Central nervous system: Alert and oriented x3.  No focal neurological deficits.  Extremities: Symmetric 5 x 5 power.  Minimal hand tremors. Skin: No rashes, lesions or ulcers Psychiatry: Judgement and insight appear normal. Mood & affect: Slightly anxious.     Data Reviewed: I have personally reviewed following labs and imaging studies  CBC: Recent Labs  Lab 02/21/18 1424 02/22/18 0623  WBC 8.8 6.9  HGB 15.4 15.2  HCT 47.1 47.2  MCV 94.6 95.7  PLT 270 222   Basic Metabolic Panel: Recent Labs  Lab 02/21/18 1424 02/22/18 0623  NA 136 138  K 4.0 4.2  CL 103 102  CO2 24 29  GLUCOSE 119* 104*  BUN 13 12  CREATININE 0.98 1.19  CALCIUM 9.5 9.4   Liver Function Tests: Recent Labs  Lab 02/21/18 1424  AST 26  ALT 32  ALKPHOS 61  BILITOT 0.7  PROT 7.6  ALBUMIN 4.7     Radiology Studies: No results found.      Scheduled Meds: . acetaminophen  1,000 mg Oral TID  . buprenorphine  8 mg Sublingual TID  . enoxaparin (LOVENOX) injection  40 mg Subcutaneous Q24H  . folic acid  1 mg Oral Daily  . gabapentin  600 mg Oral TID  . LORazepam  0-4 mg Intravenous Q12H   Or  . LORazepam  0-4 mg Oral Q12H  . lurasidone  40 mg Oral Q breakfast  . multivitamin with minerals  1 tablet Oral Daily  . nicotine  21 mg Transdermal Daily  . pantoprazole  40 mg Oral Daily  . ENSURE MAX PROTEIN  11 oz Oral Daily  . thiamine  100 mg Oral Daily   Or  . thiamine  100 mg Intravenous Daily  . traZODone  300 mg Oral QHS  . venlafaxine XR  150 mg Oral Q breakfast   Continuous Infusions:   LOS: 4 days     Marcellus ScottAnand Berklie Dethlefs, MD, FACP, [facility]. [facility]  To contact the attending provider between 7A-7P or the covering provider during after hours 7P-7A, please log into the web site [email or web address] and access using universal [place] password for that web site. If you do not have the password, please call the hospital  operator.  02/25/2018, 3:20 PM

## 2018-02-26 DIAGNOSIS — F19232 Other psychoactive substance dependence with withdrawal with perceptual disturbance: Secondary | ICD-10-CM

## 2018-02-26 DIAGNOSIS — F10230 Alcohol dependence with withdrawal, uncomplicated: Secondary | ICD-10-CM

## 2018-02-26 MED ORDER — BUPRENORPHINE HCL 8 MG SL SUBL: 8.0000 mg | SUBLINGUAL_TABLET | Freq: Three times a day (TID) | SUBLINGUAL | 0 refills | Status: AC

## 2018-02-26 NOTE — Progress Notes (Signed)
Roxanna Mew to be D/C'd Home per MD order.  Discussed prescriptions and follow up appointments with the patient. Prescriptions given to patient, medication list explained in detail. Pt verbalized understanding.  Allergies as of 02/26/2018      Reactions   Cefaclor Rash      Medication List    STOP taking these medications   propranolol ER 160 MG SR capsule Commonly known as:  INDERAL LA   SUBOXONE 8-2 MG Film Generic drug:  Buprenorphine HCl-Naloxone HCl Replaced by:  buprenorphine 8 MG Subl SL tablet     TAKE these medications   buprenorphine 8 MG Subl SL tablet Commonly known as:  SUBUTEX Place 1 tablet (8 mg total) under the tongue 3 (three) times daily for 3 days. Replaces:  SUBOXONE 8-2 MG Film   clonazePAM 2 MG tablet Commonly known as:  KLONOPIN Take 1 tablet (2 mg total) by mouth 2 (two) times daily as needed for anxiety.   Dexmethylphenidate HCl 40 MG Cp24 Take 40 mg by mouth daily.   finasteride 1 MG tablet Commonly known as:  PROPECIA Take 1 mg by mouth daily.   gabapentin 600 MG tablet Commonly known as:  NEURONTIN Take 1 tablet (600 mg total) by mouth 4 (four) times daily.   lurasidone 20 MG Tabs tablet Commonly known as:  LATUDA Take 1 tablet (20 mg total) by mouth Nightly. What changed:  Another medication with the same name was removed. Continue taking this medication, and follow the directions you see here.   omeprazole 40 MG capsule Commonly known as:  PRILOSEC TAKE 1 CAPSULE BY MOUTH EVERY DAY What changed:    how much to take  additional instructions   traZODone 150 MG tablet Commonly known as:  DESYREL Take 2 tablets (300 mg total) by mouth at bedtime.   venlafaxine XR 75 MG 24 hr capsule Commonly known as:  EFFEXOR-XR Take 2 capsules (150 mg total) by mouth daily with breakfast.       Vitals:   02/25/18 2145 02/26/18 0607  BP:  101/78  Pulse:  (!) 56  Resp: 18 16  Temp:  (!) 97.4 F (36.3 C)  SpO2:  93%    Skin  clean, dry and intact without evidence of skin break down, no evidence of skin tears noted. IV catheter discontinued intact. Site without signs and symptoms of complications. Dressing and pressure applied. Pt denies pain at this time. No complaints noted.  An After Visit Summary was printed and given to the patient. Patient escorted via WC, and D/C home via private auto.  Rondel Jumbo 02/26/2018 10:37 AM

## 2018-02-26 NOTE — Care Management Note (Signed)
Case Management Note  Patient Details  Name: Steve Bass MRN: [id number] Date of Birth: 01-29-1980  Subjective/Objective:                  discharged  Action/Plan: Discharged to home with self-care, orders checked for hhc needs. No CM needs present at time of discharge.  Patient is able to arrangement own appointments and home care.  Expected Discharge Date:  02/26/18               Expected Discharge Plan:  Home/Self Care  In-House Referral:  NA  Discharge planning Services  CM Consult  Post Acute Care Choice:  NA Choice offered to:  NA  DME Arranged:  N/A DME Agency:  NA  HH Arranged:  NA HH Agency:  NA  Status of Service:  Completed, signed off  If discussed at Long Length of Stay Meetings, dates discussed:    Additional Comments:  Golda Acre, RN 02/26/2018, 10:16 AM

## 2018-02-26 NOTE — Discharge Instructions (Signed)
Follow with Joette Catching, MD in 5-7 days  Please get a complete blood count and chemistry panel checked by your Primary MD at your next visit, and again as instructed by your Primary MD. Please get your medications reviewed and adjusted by your Primary MD.  Please request your Primary MD to go over all Hospital Tests and Procedure/Radiological results at the follow up, please get all Hospital records sent to your Prim MD by signing hospital release before you go home.  If you had Pneumonia of Lung problems at the Hospital: Please get a 2 view Chest X ray done in 6-8 weeks after hospital discharge or sooner if instructed by your Primary MD.  If you have Congestive Heart Failure: Please call your Cardiologist or Primary MD anytime you have any of the following symptoms:  1) 3 pound weight gain in 24 hours or 5 pounds in 1 week  2) shortness of breath, with or without a dry hacking cough  3) swelling in the hands, feet or stomach  4) if you have to sleep on extra pillows at night in order to breathe  Follow cardiac low salt diet and 1.5 lit/day fluid restriction.  If you have diabetes Accuchecks 4 times/day, Once in AM empty stomach and then before each meal. Log in all results and show them to your primary doctor at your next visit. If any glucose reading is under 80 or above 300 call your primary MD immediately.  If you have Seizure/Convulsions/Epilepsy: Please do not drive, operate heavy machinery, participate in activities at heights or participate in high speed sports until you have seen by Primary MD or a Neurologist and advised to do so again.  If you had Gastrointestinal Bleeding: Please ask your Primary MD to check a complete blood count within one week of discharge or at your next visit. Your endoscopic/colonoscopic biopsies that are pending at the time of discharge, will also need to followed by your Primary MD.  Get Medicines reviewed and adjusted. Please take all your  medications with you for your next visit with your Primary MD  Please request your Primary MD to go over all hospital tests and procedure/radiological results at the follow up, please ask your Primary MD to get all Hospital records sent to his/her office.  If you experience worsening of your admission symptoms, develop shortness of breath, life threatening emergency, suicidal or homicidal thoughts you must seek medical attention immediately by calling 911 or calling your MD immediately  if symptoms less severe.  You must read complete instructions/literature along with all the possible adverse reactions/side effects for all the Medicines you take and that have been prescribed to you. Take any new Medicines after you have completely understood and accpet all the possible adverse reactions/side effects.   Do not drive or operate heavy machinery when taking Pain medications.   Do not take more than prescribed Pain, Sleep and Anxiety Medications  Special Instructions: If you have smoked or chewed Tobacco  in the last 2 yrs please stop smoking, stop any regular Alcohol  and or any Recreational drug use.  Wear Seat belts while driving.  Please note You were cared for by a hospitalist during your hospital stay. If you have any questions about your discharge medications or the care you received while you were in the hospital after you are discharged, you can call the unit and asked to speak with the hospitalist on call if the hospitalist that took care of you is not available. Once  you are discharged, your primary care physician will handle any further medical issues. Please note that NO REFILLS for any discharge medications will be authorized once you are discharged, as it is imperative that you return to your primary care physician (or establish a relationship with a primary care physician if you do not have one) for your aftercare needs so that they can reassess your need for medications and monitor your  lab values.  You can reach the hospitalist office at phone [phone] or fax [phone]   If you do not have a primary care physician, you can call [phone] for a physician referral.  Activity: As tolerated with Full fall precautions use walker/cane & assistance as needed  Diet: regular  Disposition Home

## 2018-02-26 NOTE — Discharge Summary (Signed)
Physician Discharge Summary  Steve Bass TDD:[id number] DOB: 12/05/1980 DOA: 02/21/2018  PCP: Joette Catching, MD  Admit date: 02/21/2018 Discharge date: 02/26/2018  Admitted From: home Disposition:  home  Recommendations for Outpatient Follow-up:  1. Patient to follow-up with [facility] in 2 days, he states he has an appointment  Home Health: none Equipment/Devices: none  Discharge Condition: stable CODE STATUS: Full code Diet recommendation: regular  HPI: Per admitting MD, Steve Bass is a 38 y.o. male with medical history significant for Bipolar disorder, ADD, PTSD and polysubstance abuse (meth, bath salts, cocaine, alcohol) presenting with visual hallucinations of bugs crawling on his skin. This is in the setting of multidrug abuse. He is accompanied by his father. He has a long history of substance abuse. Reports injecting bath salts this morning prior to the hallucinations and drinking heavily the last few days (reports drinking a fifth of vodka last night). He has also not taken buprenorphine in the last 2 days because he sold the medicine for other drugs. He has no prior history of alcohol withdrawal or DTs. He does have history of prior admissions for similar symptoms as today. He endorses sharp, burning headache and blurry vision. He denies visual hallucinations at the time of our conversation, but does report them earlier in the day. Denies tremulousness or diaphoresis. No SI or HI. He reports that he would like help getting clean. ED Course: In the ED, he was afebrile, normotensive and not tachycardic. Had multiple elevated CIWA scores requiring benzodiazepine administration. Labs are unremarkable. UDS positive for THC, cocaine, benzodiazepines and amphetamines.  Hospital Course: Polysubstance abuse (opioids, alcohol, amphetamines, cocaine, THC and rest as above) with withdrawal: Long history of multidrug abuse as noted above.  Psychiatric consulted  in ED.  He was started back on his prior home dose of buprenorphine, clonidine and CIWA protocol. No suicidal or homicidal ideations reported.  Patient reports that he will follow-up with [facility] upon discharge by the end of this week.  Discontinued clonidine.  Psychiatry, patient follows with Dr. Shelba Flake at [facility] for medication management and he receives counseling at [facility].  Patient's parents were eager for him to going to residential substance rehab program upon hospital discharge. However patient indicated that he is not interested in going to a residential program.  Clinical social work has provided him with outpatient substance abuse resources.  Withdrawal symptoms gradually improved, on the day of discharge patient feels back to baseline, judgment intact, mentation intact.  He will be discharged home with 3 days of buprenorphine to bridge him until he is seen as an outpatient. Anxiety and depression: No suicidal or homicidal ideations.  Continue Latuda, Effexor and trazodone.  Patient states that he used to be on propranolol for panic attacks but has not taken in several weeks, discontinued.  He does take Klonopin at home.  Also on gabapentin for mood. OSA on CPAP: Continue. Obesity/Body mass index is 35.31 kg/m.  Discharge Diagnoses:  Principal Problem:   Substance abuse withdrawal, with perceptual disturbance (HCC) Active Problems:   Alcohol withdrawal delirium, acute, hyperactive (HCC)   Polysubstance abuse (HCC)   Discharge Instructions  Allergies as of 02/26/2018      Reactions   Cefaclor Rash      Medication List    STOP taking these medications   propranolol ER 160 MG SR capsule Commonly known as:  INDERAL LA   SUBOXONE 8-2 MG Film Generic  drug:  Buprenorphine HCl-Naloxone HCl Replaced by:  buprenorphine 8 MG Subl SL tablet     TAKE these medications   buprenorphine 8 MG Subl SL  tablet Commonly known as:  SUBUTEX Place 1 tablet (8 mg total) under the tongue 3 (three) times daily for 3 days. Replaces:  SUBOXONE 8-2 MG Film   clonazePAM 2 MG tablet Commonly known as:  KLONOPIN Take 1 tablet (2 mg total) by mouth 2 (two) times daily as needed for anxiety.   Dexmethylphenidate HCl 40 MG Cp24 Take 40 mg by mouth daily.   finasteride 1 MG tablet Commonly known as:  PROPECIA Take 1 mg by mouth daily.   gabapentin 600 MG tablet Commonly known as:  NEURONTIN Take 1 tablet (600 mg total) by mouth 4 (four) times daily.   lurasidone 20 MG Tabs tablet Commonly known as:  LATUDA Take 1 tablet (20 mg total) by mouth Nightly. What changed:  Another medication with the same name was removed. Continue taking this medication, and follow the directions you see here.   omeprazole 40 MG capsule Commonly known as:  PRILOSEC TAKE 1 CAPSULE BY MOUTH EVERY DAY What changed:    how much to take  additional instructions   traZODone 150 MG tablet Commonly known as:  DESYREL Take 2 tablets (300 mg total) by mouth at bedtime.   venlafaxine XR 75 MG 24 hr capsule Commonly known as:  EFFEXOR-XR Take 2 capsules (150 mg total) by mouth daily with breakfast.      Follow-up Information    Joette CatchingNyland, Leonard, MD. Schedule an appointment as soon as possible for a visit in 1 week(s).   Specialty:  Family Medicine Contact information: [address] [place] [address] [phone]           Consultations:  Psychiatry   Procedures/Studies:  none  Ct Head Wo Contrast  Result Date: 02/21/2018 CLINICAL DATA:  38 year old male with headache, blurred vision and dizziness for 1 day. EXAM: CT HEAD WITHOUT CONTRAST TECHNIQUE: Contiguous axial images were obtained from the base of the skull through the vertex without intravenous contrast. COMPARISON:  Head CT 07/26/2008. FINDINGS: Brain: Stable cerebral volume since 2010. No midline shift, ventriculomegaly, mass effect,  evidence of mass lesion, intracranial hemorrhage or evidence of cortically based acute infarction. Gray-white matter differentiation is within normal limits throughout the brain. Vascular: No suspicious intracranial vascular hyperdensity. Skull: No acute osseous abnormality identified. Sinuses/Orbits: Visualized paranasal sinuses and mastoids are stable and well pneumatized. There is mild chronic sclerosis and coalescence of the right mastoid air cells which is unchanged since 2010. Tympanic cavities remain clear. Other: Visualized orbits and scalp soft tissues are within normal limits. IMPRESSION: 1. Stable and negative noncontrast CT appearance of the brain. 2. Evidence of previous right mastoid inflammation, unchanged since 2010. Electronically Signed   By: Odessa FlemingH  Hall M.D.   On: 02/21/2018 21:04      Subjective: - no chest pain, shortness of breath, no abdominal pain, nausea or vomiting.   Discharge Exam: Vitals:   02/25/18 2145 02/26/18 0607  BP:  101/78  Pulse:  (!) 56  Resp: 18 16  Temp:  (!) 97.4 F (36.3 C)  SpO2:  93%    General: Pt is alert, awake, not in acute distress Cardiovascular: RRR, S1/S2 +, no rubs, no gallops Respiratory: CTA bilaterally, no wheezing, no rhonchi Abdominal: Soft, NT, ND, bowel sounds + Extremities: no edema, no cyanosis  The results of significant diagnostics from this hospitalization (including imaging, microbiology,  ancillary and laboratory) are listed below for reference.     Microbiology: No results found for this or any previous visit (from the past 240 hour(s)).   Labs: BNP (last 3 results) No results for input(s): BNP in the last 8760 hours. Basic Metabolic Panel: Recent Labs  Lab 02/21/18 1424 02/22/18 0623  NA 136 138  K 4.0 4.2  CL 103 102  CO2 24 29  GLUCOSE 119* 104*  BUN 13 12  CREATININE 0.98 1.19  CALCIUM 9.5 9.4   Liver Function Tests: Recent Labs  Lab 02/21/18 1424  AST 26  ALT 32  ALKPHOS 61  BILITOT 0.7  PROT  7.6  ALBUMIN 4.7   No results for input(s): LIPASE, AMYLASE in the last 168 hours. No results for input(s): AMMONIA in the last 168 hours. CBC: Recent Labs  Lab 02/21/18 1424 02/22/18 0623  WBC 8.8 6.9  HGB 15.4 15.2  HCT 47.1 47.2  MCV 94.6 95.7  PLT 270 222   Cardiac Enzymes: No results for input(s): CKTOTAL, CKMB, CKMBINDEX, TROPONINI in the last 168 hours. BNP: Invalid input(s): POCBNP CBG: No results for input(s): GLUCAP in the last 168 hours. D-Dimer No results for input(s): DDIMER in the last 72 hours. Hgb A1c No results for input(s): HGBA1C in the last 72 hours. Lipid Profile No results for input(s): CHOL, HDL, LDLCALC, TRIG, CHOLHDL, LDLDIRECT in the last 72 hours. Thyroid function studies No results for input(s): TSH, T4TOTAL, T3FREE, THYROIDAB in the last 72 hours.  Invalid input(s): FREET3 Anemia work up No results for input(s): VITAMINB12, FOLATE, FERRITIN, TIBC, IRON, RETICCTPCT in the last 72 hours. Urinalysis    Component Value Date/Time   COLORURINE YELLOW 04/07/2013 2329   APPEARANCEUR CLOUDY (A) 04/07/2013 2329   LABSPEC 1.031 (H) 04/07/2013 2329   PHURINE 5.5 04/07/2013 2329   GLUCOSEU NEGATIVE 04/07/2013 2329   HGBUR NEGATIVE 04/07/2013 2329   BILIRUBINUR SMALL (A) 04/07/2013 2329   KETONESUR >80 (A) 04/07/2013 2329   PROTEINUR 30 (A) 04/07/2013 2329   UROBILINOGEN 0.2 04/07/2013 2329   NITRITE NEGATIVE 04/07/2013 2329   LEUKOCYTESUR SMALL (A) 04/07/2013 2329   Sepsis Labs Invalid input(s): PROCALCITONIN,  WBC,  LACTICIDVEN   Time coordinating discharge: 25 minutes  SIGNED:  Pamella Pert, MD  [facility] 02/26/2018, 11:46 AM

## 2018-02-28 ENCOUNTER — Other Ambulatory Visit: Payer: Self-pay | Admitting: Psychiatry

## 2018-02-28 ENCOUNTER — Other Ambulatory Visit: Payer: Self-pay

## 2018-02-28 DIAGNOSIS — F22 Delusional disorders: Secondary | ICD-10-CM

## 2018-02-28 DIAGNOSIS — F411 Generalized anxiety disorder: Secondary | ICD-10-CM

## 2018-02-28 NOTE — Telephone Encounter (Signed)
Reordered Klonopin Rx as prior Rx

## 2018-02-28 NOTE — Telephone Encounter (Signed)
Steve BoatmanBrad appears discharged from inpatient 2 days ago possibly going to the [facility] and working with Steve PeatAlex Bass.  Inpatient, it appears they stopped propranolol and clonidine and may have reduced Latuda back to 20 mg daily.  They did continue his Klonopin and he is discharged without a supply so that patient, adoptive mother and pharmacy request Klonopin sent as 2 mg twice daily as needed #60 with 5 refills as adoptive mother locks this and dispenses at home when needed fractions of a pill, having return appointment already.

## 2018-03-01 ENCOUNTER — Other Ambulatory Visit: Payer: Self-pay | Admitting: Psychiatry

## 2018-03-11 ENCOUNTER — Telehealth: Payer: Self-pay | Admitting: Psychiatry

## 2018-03-11 NOTE — Telephone Encounter (Signed)
Patient's mom called and said that he needs more samples of latuda 40 mg. He will be out on Wednesday. His next appointment is on march 5th. Please call her to lett her know about the samples so she can come on Wednesday to pick up. [phone]

## 2018-03-11 NOTE — Telephone Encounter (Signed)
Adoptive mother phones that Steve Bass is taking 40 mg of Latuda daily after meal since hospital discharge having only 2-day supply remaining.  They did not reduce to the 20 mg Latuda dose at hospital discharge as Steve Bass refused the opportunity for Steve Bass rehab returning home with no delusions but significantly regressed partially treated emotions and behavior.  He will see Steve Bass counseling therapist 03/13/2018 during which mother is to come here to pick up Latuda 40 mg a day after meal #28 samples until he returns for next appointment here 03/21/2018.

## 2018-03-12 ENCOUNTER — Ambulatory Visit: Payer: Self-pay | Admitting: Gastroenterology

## 2018-03-12 ENCOUNTER — Other Ambulatory Visit: Payer: Self-pay | Admitting: Psychiatry

## 2018-03-12 NOTE — Telephone Encounter (Signed)
Recent brief inpatient stabilization at [facility] of Steve Bass patient refusing more extended [facility] extended stay stopped only his propranolol and continued his gabapentin 600 mg 4 times daily #360 no refill sent to [facility].

## 2018-03-12 NOTE — Telephone Encounter (Signed)
Refill ok with just being discharged from hospital?  Please advise

## 2018-03-21 ENCOUNTER — Encounter: Payer: Self-pay | Admitting: Psychiatry

## 2018-03-21 ENCOUNTER — Ambulatory Visit: Payer: BLUE CROSS/BLUE SHIELD | Admitting: Psychiatry

## 2018-03-21 VITALS — BP 120/88 | HR 68 | Ht 72.0 in | Wt 282.0 lb

## 2018-03-21 DIAGNOSIS — F5081 Binge eating disorder: Secondary | ICD-10-CM

## 2018-03-21 DIAGNOSIS — F22 Delusional disorders: Secondary | ICD-10-CM | POA: Diagnosis not present

## 2018-03-21 DIAGNOSIS — G2581 Restless legs syndrome: Secondary | ICD-10-CM

## 2018-03-21 DIAGNOSIS — F411 Generalized anxiety disorder: Secondary | ICD-10-CM | POA: Diagnosis not present

## 2018-03-21 DIAGNOSIS — F902 Attention-deficit hyperactivity disorder, combined type: Secondary | ICD-10-CM

## 2018-03-21 DIAGNOSIS — F1120 Opioid dependence, uncomplicated: Secondary | ICD-10-CM

## 2018-03-21 DIAGNOSIS — F1524 Other stimulant dependence with stimulant-induced mood disorder: Secondary | ICD-10-CM | POA: Diagnosis not present

## 2018-03-21 MED ORDER — CLONAZEPAM 2 MG PO TABS: 2.0000 mg | ORAL_TABLET | Freq: Two times a day (BID) | ORAL | 0 refills | Status: DC | PRN

## 2018-03-21 MED ORDER — LURASIDONE HCL 40 MG PO TABS: 40.0000 mg | ORAL_TABLET | Freq: Every day | ORAL | 0 refills | Status: DC
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
## 2018-03-27 DIAGNOSIS — Z79899 Other long term (current) drug therapy: Secondary | ICD-10-CM | POA: Diagnosis not present

## 2018-04-02 ENCOUNTER — Telehealth: Payer: Self-pay

## 2018-04-02 NOTE — Telephone Encounter (Signed)
Appt was rescheduled by his mom to April prior to phone call

## 2018-04-03 ENCOUNTER — Ambulatory Visit: Payer: Self-pay | Admitting: Gastroenterology

## 2018-04-18 ENCOUNTER — Ambulatory Visit (INDEPENDENT_AMBULATORY_CARE_PROVIDER_SITE_OTHER): Payer: BLUE CROSS/BLUE SHIELD | Admitting: Psychiatry

## 2018-04-18 ENCOUNTER — Other Ambulatory Visit: Payer: Self-pay

## 2018-04-18 ENCOUNTER — Encounter: Payer: Self-pay | Admitting: Psychiatry

## 2018-04-18 ENCOUNTER — Other Ambulatory Visit: Payer: Self-pay | Admitting: Psychiatry

## 2018-04-18 DIAGNOSIS — F1524 Other stimulant dependence with stimulant-induced mood disorder: Secondary | ICD-10-CM

## 2018-04-18 DIAGNOSIS — F411 Generalized anxiety disorder: Secondary | ICD-10-CM

## 2018-04-18 DIAGNOSIS — F902 Attention-deficit hyperactivity disorder, combined type: Secondary | ICD-10-CM | POA: Diagnosis not present

## 2018-04-18 DIAGNOSIS — F22 Delusional disorders: Secondary | ICD-10-CM

## 2018-04-18 DIAGNOSIS — F1120 Opioid dependence, uncomplicated: Secondary | ICD-10-CM

## 2018-04-18 DIAGNOSIS — F5081 Binge eating disorder: Secondary | ICD-10-CM

## 2018-04-18 DIAGNOSIS — G2581 Restless legs syndrome: Secondary | ICD-10-CM
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
## 2018-04-19 ENCOUNTER — Telehealth: Payer: Self-pay

## 2018-04-19 NOTE — Telephone Encounter (Signed)
Left message for patient to return my call to change to zoom.

## 2018-04-21 ENCOUNTER — Other Ambulatory Visit: Payer: Self-pay | Admitting: Psychiatry

## 2018-04-21 DIAGNOSIS — F22 Delusional disorders: Secondary | ICD-10-CM

## 2018-04-21 DIAGNOSIS — F411 Generalized anxiety disorder: Secondary | ICD-10-CM

## 2018-04-22 DIAGNOSIS — Z79899 Other long term (current) drug therapy: Secondary | ICD-10-CM | POA: Diagnosis not present

## 2018-04-22 NOTE — Telephone Encounter (Signed)
Telemedicine appointment last week noted from patient that he and adoptive parents consider the Klonopin management to be similar to his Focalin and Suboxone.  The Klonopin was started when delusional disorder was also likely exacerbated by sciatic relapse in stimulant use disorder.  The Klonopin is the least negative consequence, and the patient has clarified last week that his impulse to act out in substance use or other law breaking behavior has been much higher with the stay at home quarantine for coronavirus pandemic.  Klonopin is currently therefore currently renewed as 2 mg twice daily as needed #60 with no refill for anxiety as he denied delusions last week, clean necessary with relative contraindication sent to [facility].

## 2018-04-22 NOTE — Telephone Encounter (Signed)
Spoke with patient's mother (DPR) and she states she would prefer patient to do a telephone visit. Appt changed to telephone visit, pt has [facility].

## 2018-04-22 NOTE — Telephone Encounter (Signed)
Last fill 03/28/2018 #60

## 2018-05-06 ENCOUNTER — Ambulatory Visit (INDEPENDENT_AMBULATORY_CARE_PROVIDER_SITE_OTHER): Payer: BLUE CROSS/BLUE SHIELD | Admitting: Gastroenterology

## 2018-05-06 ENCOUNTER — Other Ambulatory Visit: Payer: Self-pay

## 2018-05-06 ENCOUNTER — Encounter: Payer: Self-pay | Admitting: Gastroenterology

## 2018-05-06 VITALS — Ht 72.0 in | Wt 250.0 lb

## 2018-05-06 DIAGNOSIS — K2100 Gastro-esophageal reflux disease with esophagitis, without bleeding: Secondary | ICD-10-CM

## 2018-05-06 MED ORDER — OMEPRAZOLE 40 MG PO CPDR: 40.0000 mg | DELAYED_RELEASE_CAPSULE | Freq: Every day | ORAL | 11 refills | Status: AC

## 2018-05-06 NOTE — Progress Notes (Signed)
    History of Present Illness: This is a 38 year old male with a GERD.  EGD in 2009 showed LA class C erosive esophagitis and a 4 cm hiatal hernia.  Retained solid food was noted in the stomach.  He relates that his reflux symptoms have been very well controlled for many months.  He denies any active reflux symptoms.  He has no other gastrointestinal complaints.  Denies weight loss, abdominal pain, constipation, diarrhea, change in stool caliber, melena, hematochezia, nausea, vomiting, dysphagia, chest pain.   Current Medications, Allergies, Past Medical History, Past Surgical History, Family History and Social History were reviewed in [employer] record.   Physical Exam: Telemedicine visit - not performed   Assessment and Recommendations:  1.  GERD with LA class C erosive esophagitis.  Continue omeprazole 40 mg daily, refill for 1 year.  Follow standard antireflux measures. REV in 1 year.    These services were provided via telemedicine, audio only per patient request.  The patient was at home with his mother who answered the phone nearby and the provider was in the office, alone.  We discussed the limitations of evaluation and management by telemedicine and the availability of in person appointments.  Patient consented for this telemedicine visit and is aware of possible charges for this service.  The other person participating in the telemedicine service was Christie Nottingham, CMA who reviewed medications, allergies, past history and completed AVS.  Time spent on call: 4 minutes

## 2018-05-06 NOTE — Patient Instructions (Signed)
We have sent the following medications to your pharmacy for you to pick up at your convenience:omeprazole.  Thank you for choosing me and [facility].  Malcolm T. Stark, Jr., MD., FACG   

## 2018-05-16 ENCOUNTER — Ambulatory Visit: Payer: BLUE CROSS/BLUE SHIELD | Admitting: Psychiatry

## 2018-05-16 ENCOUNTER — Encounter: Payer: Self-pay | Admitting: Psychiatry

## 2018-05-16 ENCOUNTER — Other Ambulatory Visit: Payer: Self-pay

## 2018-05-16 DIAGNOSIS — F22 Delusional disorders: Secondary | ICD-10-CM | POA: Diagnosis not present

## 2018-05-16 DIAGNOSIS — F411 Generalized anxiety disorder: Secondary | ICD-10-CM

## 2018-05-16 DIAGNOSIS — F902 Attention-deficit hyperactivity disorder, combined type: Secondary | ICD-10-CM | POA: Diagnosis not present

## 2018-05-16 DIAGNOSIS — F1524 Other stimulant dependence with stimulant-induced mood disorder: Secondary | ICD-10-CM

## 2018-05-16 DIAGNOSIS — F5081 Binge eating disorder: Secondary | ICD-10-CM

## 2018-05-16 MED ORDER — LURASIDONE HCL 40 MG PO TABS: 40.0000 mg | ORAL_TABLET | Freq: Every day | ORAL | 0 refills | Status: DC

## 2018-05-16 MED ORDER — PROPRANOLOL HCL ER 160 MG PO CP24: 160.0000 mg | ORAL_CAPSULE | Freq: Every day | ORAL | 1 refills | Status: DC
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
## 2018-05-21 ENCOUNTER — Other Ambulatory Visit: Payer: Self-pay | Admitting: Psychiatry

## 2018-05-21 DIAGNOSIS — F411 Generalized anxiety disorder: Secondary | ICD-10-CM

## 2018-05-22 DIAGNOSIS — Z79899 Other long term (current) drug therapy: Secondary | ICD-10-CM | POA: Diagnosis not present

## 2018-05-22 NOTE — Telephone Encounter (Signed)
Any refills ?

## 2018-05-22 NOTE — Telephone Encounter (Signed)
Patient requesting by phone in the interim since last appointment 05/16/2018 that this office take over his substitution treatment for stimulant dependence with Focalin similar to the pain clinic providing his Suboxone and this office providing the Klonopin.  I have instructed him to stop the Focalin.  Will send in a refill of the Klonopin but he tends to use it almost scheduled when he and mother maintain they just give it as needed as chips of a pill.

## 2018-06-04 DIAGNOSIS — Z03818 Encounter for observation for suspected exposure to other biological agents ruled out: Secondary | ICD-10-CM | POA: Diagnosis not present

## 2018-06-04 DIAGNOSIS — R509 Fever, unspecified: Secondary | ICD-10-CM | POA: Diagnosis not present

## 2018-06-06 ENCOUNTER — Other Ambulatory Visit: Payer: Self-pay | Admitting: Psychiatry

## 2018-06-06 ENCOUNTER — Encounter: Payer: Self-pay | Admitting: Psychiatry

## 2018-06-06 ENCOUNTER — Other Ambulatory Visit: Payer: Self-pay

## 2018-06-06 ENCOUNTER — Ambulatory Visit (INDEPENDENT_AMBULATORY_CARE_PROVIDER_SITE_OTHER): Payer: BLUE CROSS/BLUE SHIELD | Admitting: Psychiatry

## 2018-06-06 DIAGNOSIS — F411 Generalized anxiety disorder: Secondary | ICD-10-CM

## 2018-06-06 DIAGNOSIS — F22 Delusional disorders: Secondary | ICD-10-CM | POA: Diagnosis not present

## 2018-06-06 DIAGNOSIS — F5081 Binge eating disorder: Secondary | ICD-10-CM

## 2018-06-06 DIAGNOSIS — F902 Attention-deficit hyperactivity disorder, combined type: Secondary | ICD-10-CM

## 2018-06-06 DIAGNOSIS — F1524 Other stimulant dependence with stimulant-induced mood disorder: Secondary | ICD-10-CM

## 2018-06-06 NOTE — Progress Notes (Signed)
**Note Steve-Identified via Obfuscation** [facility]  Patient ID: Steve Bass,  MRN: [id number]  PCP: Steve Catching, MD  Date of Evaluation: 06/06/2018 Time spent:15 minutes from 1445 to 1500  Chief Complaint:  Chief Complaint    Paranoid; Hallucinations; Anxiety; ADHD; Eating Disorder; Drug Problem      HISTORY/CURRENT STATUS: Steve Bass is provided telemedicine audiovisual appointment session, declining the video camera due to generalized anxiety, with consent and collateral of [employer] for psychiatric interview and exam in 3-week evaluation and management of anxiety, delusional disorder, ADHD/binge eating disorder, and multiple addictions with consequences.  Steve Bass is sincere without hysteroid and hostile dependence today during which he thinks something must be wrong though possibly an opportunity for making progress socially and operationally.  He does still intend to start [employer] this August changing major to abnormal psychology.  He has started counseling others on the Internet which he finds at times easy and sometimes difficult needing to take a break, stating he is going by his own experience having no formal training in counseling.  He started propranolol 1 month ago though at half of the previous dose 160 mg LA daily for high anxiety that is helping anxiety somewhat. He complains today of mental and physical deep fatigue that is not considered likely to be associated with propranolol or other medications.  Sobriety may be somewhat complete this week as one etiology though such symptoms are more likely associated with his counseling activities.  He did seek telemedicine  for reported fever of 100.3 fatigue medically cleared having negative COVID-19 and influenza tests.  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 [place] registry  documents he did not stop Focalin as recommended last session but has interim fill 05/23/2018 along with Klonopin (only helpful for mental health) and Suboxone on 05/29/2018. He has no mania, psychosis, delirium or dissociation today.  Anxiety  Presents for follow-up visit. Symptoms include compulsions, decreased concentration, depressed mood, excessive worry, insomnia and nervous/anxious behavior. Patient reports no chest pain, dry mouth, malaise, nausea, obsessions, panic or suicidal ideas. Symptoms occur most days. The severity of symptoms is moderate and causing significant distress. The quality of sleep is fair. Nighttime awakenings: occasional.   His past medical history is significant for depression and suicide attempts. Compliance with medications is 76-100%.   Individual Medical History/ Review of Systems: Changes? :Yes  Seen by [facility] 2 days ago finding Patient sent home with instructions to transition to home quarantine and with a plan to notify by phone if COVID-19 positive results received. Negative results will be released to MyChart. Patient verbalized understanding of instructions and process.   Allergies: Cefaclor  Current Medications:  Current Outpatient Medications:  .  buprenorphine-naloxone (SUBOXONE) 8-2 mg SUBL SL tablet, Place 1 tablet under the tongue daily., Disp: , Rfl:  .  clonazePAM (KLONOPIN) 2 MG tablet, TAKE 1 TABLET (2 MG TOTAL) BY MOUTH 2 (TWO) TIMES DAILY AS NEEDED FOR ANXIETY., Disp: 60 tablet, Rfl: 0 .  Dexmethylphenidate HCl 40 MG CP24, Take 40 mg by mouth daily., Disp: , Rfl:  .  finasteride (PROPECIA) 1 MG tablet, Take 1 mg by mouth daily. , Disp: , Rfl:  .  gabapentin (NEURONTIN) 600 MG tablet, TAKE 1 TABLET (600 MG TOTAL) BY MOUTH 4 (FOUR) TIMES DAILY., Disp: 360 tablet, Rfl: 0 .  lurasidone (LATUDA) 40 MG TABS tablet, Take 1 tablet (40  mg total) by mouth daily with breakfast., Disp: 42 tablet, Rfl: 0 .  omeprazole (PRILOSEC) 40 MG capsule, Take 1 capsule  (40 mg total) by mouth daily., Disp: 30 capsule, Rfl: 11 .  propranolol ER (INDERAL LA) 160 MG SR capsule, Take 1 capsule (160 mg total) by mouth daily after breakfast., Disp: 30 capsule, Rfl: 1 .  traZODone (DESYREL) 150 MG tablet, TAKE 2 TABLETS (300 MG TOTAL) BY MOUTH AT BEDTIME., Disp: 180 tablet, Rfl: 0 .  venlafaxine XR (EFFEXOR-XR) 75 MG 24 hr capsule, Take 2 capsules (150 mg total) by mouth daily with breakfast., Disp: 180 capsule, Rfl: 0   Medication Side Effects: fatigue/weakness  Possibly but doubtfully from Inderal.  Family Medical/ Social History: Changes? Yes Substitution therapy of Focalin and Suboxone is distinguished from Klonopin prn for delusional and mood exacerbations, seeking for mental health he and adoptive family can seek stabilization outside of addictive substance as much as possible.  MENTAL HEALTH EXAM:  There were no vitals taken for this visit.There is no height or weight on file to calculate BMI. as not present here  General Appearance: N/A  Eye Contact:  N/A  Speech:  Blocked, Normal Rate and Talkative clear and coherent  Volume:  Normal  Mood:  Anxious, Euthymic and Worthless  Affect:  Full Range and Anxious  Thought Process:  Coherent and Linear  Orientation:  Full (Time, Place, and Person)  Thought Content: Obsessions and Rumination   Suicidal Thoughts:  No  Homicidal Thoughts:  No  Memory:  Immediate;   Good Remote;   Fair  Judgement:  Fair  Insight:  Fair  Psychomotor Activity:  Normal, Increased and Mannerisms  Concentration:  Concentration: Fair and Attention Span: Fair  Recall:  [employer]Fair  Fund of Knowledge: Good  Language: Good  Assets:  Leisure Time Social Support Talents/Skills  ADL's:  Intact  Cognition: WNL  Prognosis:  Poor    DIAGNOSES:    ICD-10-CM   1. Delusional disorder, somatic type, multiple episodes, currently in acute episode (HCC) F22   2. Generalized anxiety disorder F41.1   3. Attention deficit hyperactivity disorder  (ADHD), combined type, severe F90.2   4. Binge-eating disorder, moderate F50.81   5. Other stimulant dependence with stimulant-induced mood disorder (HCC) F15.24     Receiving Psychotherapy: Yes  Evelena PeatAlex Wilson, LPC, LCAS and NA   RECOMMENDATIONS: Latuda 40 mg every morning is for delusional disorder and substance induced mood disorder.  He continues propranolol 160 mg LA as 1 every morning which can be reduced if fatigue persists for generalized anxiety.  He continues current medication regimen of venlafaxine XR 75 mg taking 2 daily, clonazepam 2 mg twice daily as needed for severe anxiety and agitation, trazodone 150 mg take 2 every bedtime, and gabapentin 600 mg four times daily for restless legs and anxiety having current supply. He agrees with confidence to extend follow-up appointment to 2 months, continuing therapies.  Virtual Visit via Video Note  I connected with Steve MewBenjamin B Bass on 06/06/18 at  2:40 PM EDT by a video enabled telemedicine application and verified that I am speaking with the correct person using two identifiers.  Location: Patient: Individually at adoptive family residence Provider: [facility]   I discussed the limitations of evaluation and management by telemedicine and the availability of in person appointments. The patient expressed understanding and agreed to proceed.  History of Present Illness: 3-week evaluation and management address anxiety, delusional disorder, ADHD/binge eating disorder, and multiple addictions with  consequences.  Steve Bass is sincere without hysteroid and hostile dependence today during which he thinks something must be wrong though possibly an opportunity for making progress socially and operationally.   Observations/Objective: Mood:  Anxious, Euthymic and Worthless  Affect:  Full Range and Anxious  Thought Process:  Coherent and Linear  Orientation:  Full (Time, Place, and Person)  Thought Content: Obsessions and  Rumination    Assessment and Plan: Latuda 40 mg every morning is for delusional disorder and substance induced mood disorder.  He continues propranolol 160 mg LA as 1 every morning which can be reduced if fatigue persists for generalized anxiety.  He continues current medication regimen of venlafaxine XR 75 mg taking 2 daily, clonazepam 2 mg twice daily as needed for severe anxiety and agitation, trazodone 150 mg take 2 every bedtime, and gabapentin 600 mg four times daily for restless legs and anxiety having current supply.  Follow Up Instructions: He agrees with confidence to extend follow-up appointment to 2 months, continuing therapies.   I discussed the assessment and treatment plan with the patient. The patient was provided an opportunity to ask questions and all were answered. The patient agreed with the plan and demonstrated an understanding of the instructions.   The patient was advised to call back or seek an in-person evaluation if the symptoms worsen or if the condition fails to improve as anticipated.  I provided 15 minutes of non-face-to-face time during this encounter. [place] [employer] meeting #[id number] Meeting password: [email or web address]   Chauncey Mann, MD   Chauncey Mann, MD

## 2018-06-07 NOTE — Telephone Encounter (Signed)
Steve Bass continues to have generalized anxiety, chronic pain, and multi-faceted addiction behaviors with [place] registry documenting that he continues to get Suboxone and Focalin where despite our discussion for prevention at appointment before last. When he requested to include these with his usual psychiatric medications, I suggested that he disengage from substitution therapy other than the Klonopin.  Appointment yesterday he noted an unusual deep mental and physical fatigue not likely from starting his propranolol 1 month ago at half the previous dose, though we discussed decreasing it again as needed symptoms persist unexplained.  In the end he concludes this is likely from his continuation of cocaine and amphetamines.  Gabapentin is medically appropriate with no contraindication 600 mg 4 times daily #360 with no refill [facility].

## 2018-06-07 NOTE — Telephone Encounter (Signed)
Visit yesterday, ok to refill?

## 2018-06-09 ENCOUNTER — Other Ambulatory Visit: Payer: Self-pay | Admitting: Psychiatry

## 2018-06-09 DIAGNOSIS — F411 Generalized anxiety disorder: Secondary | ICD-10-CM

## 2018-06-11 DIAGNOSIS — Z79899 Other long term (current) drug therapy: Secondary | ICD-10-CM | POA: Diagnosis not present

## 2018-06-11 DIAGNOSIS — F319 Bipolar disorder, unspecified: Secondary | ICD-10-CM | POA: Diagnosis not present

## 2018-06-11 DIAGNOSIS — F1121 Opioid dependence, in remission: Secondary | ICD-10-CM | POA: Diagnosis not present

## 2018-06-12 DIAGNOSIS — I10 Essential (primary) hypertension: Secondary | ICD-10-CM | POA: Diagnosis not present

## 2018-06-12 DIAGNOSIS — K219 Gastro-esophageal reflux disease without esophagitis: Secondary | ICD-10-CM | POA: Diagnosis not present

## 2018-06-12 DIAGNOSIS — E669 Obesity, unspecified: Secondary | ICD-10-CM | POA: Diagnosis not present

## 2018-06-18 ENCOUNTER — Other Ambulatory Visit: Payer: Self-pay | Admitting: Psychiatry

## 2018-06-18 DIAGNOSIS — F411 Generalized anxiety disorder: Secondary | ICD-10-CM

## 2018-06-19 ENCOUNTER — Other Ambulatory Visit: Payer: Self-pay | Admitting: Psychiatry

## 2018-06-19 DIAGNOSIS — F1121 Opioid dependence, in remission: Secondary | ICD-10-CM | POA: Diagnosis not present

## 2018-06-19 DIAGNOSIS — F411 Generalized anxiety disorder: Secondary | ICD-10-CM

## 2018-06-19 DIAGNOSIS — Z79899 Other long term (current) drug therapy: Secondary | ICD-10-CM | POA: Diagnosis not present

## 2018-06-19 NOTE — Telephone Encounter (Signed)
Has f/up 07/22

## 2018-06-19 NOTE — Telephone Encounter (Signed)
Last appointment 06/06/2018 reviewed last E scription 05/22/2018 with no refills after a month supply with [place] registry appropriate, still taking the Focalin and Suboxone from addiction services though adoptive mother helps to oversee the Klonopin for anxiety sent as 2 mg twice daily as needed #60 with 1 refill for interim to 08/07/2018 follow-up medically necessary with no contraindication sent [facility].

## 2018-07-10 ENCOUNTER — Other Ambulatory Visit: Payer: Self-pay | Admitting: Psychiatry

## 2018-07-10 DIAGNOSIS — F319 Bipolar disorder, unspecified: Secondary | ICD-10-CM | POA: Diagnosis not present

## 2018-07-10 DIAGNOSIS — F411 Generalized anxiety disorder: Secondary | ICD-10-CM

## 2018-07-10 DIAGNOSIS — Z79899 Other long term (current) drug therapy: Secondary | ICD-10-CM | POA: Diagnosis not present

## 2018-07-10 DIAGNOSIS — F1121 Opioid dependence, in remission: Secondary | ICD-10-CM | POA: Diagnosis not present

## 2018-07-17 ENCOUNTER — Other Ambulatory Visit: Payer: Self-pay | Admitting: Psychiatry

## 2018-07-17 DIAGNOSIS — F411 Generalized anxiety disorder: Secondary | ICD-10-CM

## 2018-07-17 DIAGNOSIS — Z79899 Other long term (current) drug therapy: Secondary | ICD-10-CM | POA: Diagnosis not present

## 2018-07-17 DIAGNOSIS — F1121 Opioid dependence, in remission: Secondary | ICD-10-CM | POA: Diagnosis not present

## 2018-07-17 MED ORDER — LURASIDONE HCL 40 MG PO TABS: 40.0000 mg | ORAL_TABLET | Freq: Every day | ORAL | 0 refills | Status: DC

## 2018-07-17 MED ORDER — CLONAZEPAM 2 MG PO TABS: 2.0000 mg | ORAL_TABLET | Freq: Two times a day (BID) | ORAL | 0 refills | Status: DC | PRN

## 2018-07-17 NOTE — Telephone Encounter (Signed)
Mother called from the PCP and Therapist office.  They are going to send him to a 30 day rehab facility tomorrow.  He will need to take his medications with him.  He needs a refill of his Klonopin filled today.  It may be about 4 days early but must be able to get it today to take with him.  He is ok on his other medications except he will need more samples of the Latuda 40 mg.  Apparently we have been giving him samples due to cost.  Will need at least 2 more weeks to go with what they have.  [facility] [place]  They will come today to get samples of Latuda.  They are faxing [place] a form to complete comfirming medications.  We are to fax back to the facility.  He has an appt 08/06/18 but if he is in rehab it will need to be rescheduled.

## 2018-07-17 NOTE — Telephone Encounter (Signed)
Steve Bass faxes med log to be completed as adoptive mother prepares for substance use rehab facility entry at 0800 tomorrow 07/18/2018.  Med list of 6 medicines from here is compiled noting that the Klonopin they need refilled at [place] [facility] is for agitation or panic associated with delusions of infestation by parasites sent as #60 no refill to [facility] with that we will label we may have an existing refill there for July 3 that may therefore be otherwise canceled if today's prescription is filled for rehab.  Latuda samples #21 to take  1 of the 40 mg tabs every day with breakfast are also provided, otherwise having existing supply for course of rehab.

## 2018-07-18 ENCOUNTER — Telehealth: Payer: Self-pay | Admitting: Psychiatry

## 2018-07-18 NOTE — Telephone Encounter (Signed)
Pt mom Jana Half called to advise facility [facility] is in will not allow him to take Clonazepam. Ask for replacement. Also, ask for authorization to administer Propranolol  ER was not on info faxed to facility earlier. Pharmacy to use for new or changed Rx call [facility] @ [phone]. May reach [place] @ [phone] or [phone]

## 2018-07-18 NOTE — Telephone Encounter (Signed)
I spoke with adoptive mother as she requested who states her fear is that Leroy Sea will leave the treatment program where there is no detox with medications, as she reviews the Klonopin that was prescribed for reducing the agitation and anxiety when delusional about infestation now remitted on Latuda is apparently used by [place] and mother over time for her fear that he will have panic.  Propranolol was stopped when in [facility] last restarted at the family and [name]'s request at half of the previous dose.  She states he has not been taking it at home lately but that she gives him the Klonopin instead which the treatment center will only give for panic prn which is how it was ordered.  She worries that 160 mg LA of Inderal will not be enough to restart if he is in the treatment center not getting the medicines that usually given at home.  Help her understand that the goal is to learn at the treatment center how to cope with anxiety rather than medicating him such that he does not have to learn.  I assure that another statement of dosing of the propranolol 160 mg LA every morning is sent and to refax  the list of medications that included the Inderal yesterday, fax [phone].

## 2018-07-19 ENCOUNTER — Other Ambulatory Visit: Payer: Self-pay | Admitting: Psychiatry

## 2018-07-19 DIAGNOSIS — F411 Generalized anxiety disorder: Secondary | ICD-10-CM

## 2018-07-24 ENCOUNTER — Other Ambulatory Visit: Payer: Self-pay

## 2018-07-24 DIAGNOSIS — F411 Generalized anxiety disorder: Secondary | ICD-10-CM

## 2018-07-24 DIAGNOSIS — F5081 Binge eating disorder: Secondary | ICD-10-CM

## 2018-07-24 MED ORDER — VENLAFAXINE HCL ER 75 MG PO CP24: 150.0000 mg | ORAL_CAPSULE | Freq: Every day | ORAL | 0 refills | Status: DC

## 2018-07-24 NOTE — Telephone Encounter (Signed)
Last fill from this office Effexor 150 mg XR as 2 every morning was 10/31/2017 possibly otherwise filled from inpatient discharges or having cumulative supply from before.  Though he is to be in rehab center currently, they rely on his own supply therefore will send #180 with no refill to [facility].

## 2018-08-06 ENCOUNTER — Ambulatory Visit: Payer: BLUE CROSS/BLUE SHIELD | Admitting: Psychiatry

## 2018-08-08 ENCOUNTER — Other Ambulatory Visit: Payer: Self-pay | Admitting: Psychiatry

## 2018-08-08 DIAGNOSIS — F411 Generalized anxiety disorder: Secondary | ICD-10-CM

## 2018-08-20 ENCOUNTER — Ambulatory Visit (INDEPENDENT_AMBULATORY_CARE_PROVIDER_SITE_OTHER): Payer: BC Managed Care – PPO | Admitting: Psychiatry

## 2018-08-20 ENCOUNTER — Other Ambulatory Visit: Payer: Self-pay

## 2018-08-20 ENCOUNTER — Encounter: Payer: Self-pay | Admitting: Psychiatry

## 2018-08-20 VITALS — Ht 71.0 in | Wt 243.0 lb

## 2018-08-20 DIAGNOSIS — F902 Attention-deficit hyperactivity disorder, combined type: Secondary | ICD-10-CM | POA: Diagnosis not present

## 2018-08-20 DIAGNOSIS — F411 Generalized anxiety disorder: Secondary | ICD-10-CM | POA: Diagnosis not present

## 2018-08-20 DIAGNOSIS — F1524 Other stimulant dependence with stimulant-induced mood disorder: Secondary | ICD-10-CM | POA: Diagnosis not present

## 2018-08-20 DIAGNOSIS — G2581 Restless legs syndrome: Secondary | ICD-10-CM

## 2018-08-20 DIAGNOSIS — F5081 Binge eating disorder: Secondary | ICD-10-CM

## 2018-08-20 DIAGNOSIS — F1120 Opioid dependence, uncomplicated: Secondary | ICD-10-CM

## 2018-08-20 DIAGNOSIS — F22 Delusional disorders: Secondary | ICD-10-CM | POA: Diagnosis not present

## 2018-08-20 MED ORDER — CLONAZEPAM 2 MG PO TABS: 2.0000 mg | ORAL_TABLET | Freq: Two times a day (BID) | ORAL | 3 refills | Status: DC | PRN

## 2018-08-20 NOTE — Progress Notes (Signed)
[facility]  Patient ID: Steve Bass,  MRN: [id number]  PCP: Steve Bass, Leonard, MD  Date of Evaluation: 08/20/2018 Time spent:20 minutes from 1455 to 1510  Chief Complaint:  Chief Complaint    Anxiety; Trauma; Depression; Agitation; Addiction Problem; Eating Disorder      HISTORY/CURRENT STATUS: Steve Bass is seen in office onsite face-to-face individually as he requires adoptive mother to remain in the lobby with consent with epic collateral for psychiatric interview and exam in 2.4662-month evaluation and treatment of GAD and PTSD with panic, delusional disorder currently in remission, ADHD/binge eating disorder, and stimulant/opiate/other use disorder with stimulant-induced mood disorder.  Despite such complexity, the patient's addictions treatment both with [facility] and more recently at [facility] treatment have in the patient's formulation established a harm reduction model for his treatment which will include Klonopin may not include Suboxone and Focalin any longer.  Only the Klonopin was prescribed in this office with adoptive mother requiring a refill and restart of propranolol for panic anxiety at the time of his entering the residential treatment as she expected he would leave if having untreated panic as to Inderal and/or Klonopin.  The patient suggests that they did give him an occasional Klonopin though it was against the rules, though he did not have any regular benzodiazepene treatment and scheduled propranolol was restarted at 160 mg LA daily despite being stopped in hiis last substance abuse treatment at [facility].  He reports completing the program, and they do not send any records.  He recalls entering the program with the help of Evelena Peatlex Bass and apparently Steve [facility]ertSarah Bass at [facility].  He states he has a girlfriend Steve Bass now but does not clarify if she is acquaintance from treatment.  He states he is running away from drugs in theory.   He suggests having as needed medication for anxious urticarial type vasomotor dermatitis now ceased.  He had been planning [employer] if not employment stating he now feels and functions better having a girlfriend and wishing to be more successful in his relationship and responsibility side of life.  He has no current psychosis minimizing past parasitosis, no mania, no suicidality, and no delirium.  Anxiety        Presents forfollow-upvisit. Symptoms includecompulsions,decreased concentration, labile dysphoric mood,excessive worry,panic, insomniaand nervous/anxious behavior. Patient reports nochest pain,dry mouth,malaise,nausea,obsessions,panicor suicidal ideas. Symptoms occurmost days. The severity of symptoms ismoderate and causing significant distress. The quality of sleep isfair. Nighttime awakenings:occasional.  His past medical history is significant fordepressionand suicide attempts. Compliance with medications is76-100%.   Individual Medical History/ Review of Systems: Changes? :Yes He has fills for most medications from time of entering the [facility] and feels medically up-to-date and renewed having a 39 pound weight reduction in the last 6 months having weight from 3 interim telemedicine visits.  Allergies: Cefaclor  Current Medications:  Current Outpatient Medications:  .  buprenorphine-naloxone (SUBOXONE) 8-2 mg SUBL SL tablet, Place 1 tablet under the tongue daily., Disp: , Rfl:  .  clonazePAM (KLONOPIN) 2 MG tablet, Take 1 tablet (2 mg total) by mouth 2 (two) times daily as needed for anxiety (Panic)., Disp: 60 tablet, Rfl: 3 .  Dexmethylphenidate HCl 40 MG CP24, Take 40 mg by mouth daily., Disp: , Rfl:  .  finasteride (PROPECIA) 1 MG tablet, Take 1 mg by mouth daily. , Disp: , Rfl:  .  gabapentin (NEURONTIN) 600 MG tablet, TAKE 1 TABLET (600 MG TOTAL) BY MOUTH 4 (FOUR) TIMES DAILY., Disp:  360 tablet, Rfl: 0 .  lurasidone (LATUDA) 40 MG TABS tablet, Take 1 tablet (40 mg  total) by mouth daily with breakfast., Disp: 21 tablet, Rfl: 0 .  omeprazole (PRILOSEC) 40 MG capsule, Take 1 capsule (40 mg total) by mouth daily., Disp: 30 capsule, Rfl: 11 .  propranolol ER (INDERAL LA) 160 MG SR capsule, TAKE 1 CAPSULE (160 MG TOTAL) BY MOUTH DAILY AFTER BREAKFAST., Disp: 30 capsule, Rfl: 1 .  traZODone (DESYREL) 150 MG tablet, TAKE 2 TABLETS (300 MG TOTAL) BY MOUTH AT BEDTIME., Disp: 180 tablet, Rfl: 0 .  venlafaxine XR (EFFEXOR-XR) 75 MG 24 hr capsule, Take 2 capsules (150 mg total) by mouth daily with breakfast., Disp: 180 capsule, Rfl: 0   Medication Side Effects: none  Family Medical/ Social History: Changes? Yes the patient allows clarification of the complexity of recurrent relapses into addiction his own physical and mental health, adoptive parents, and medical providers.  MENTAL HEALTH EXAM:  Height 5\' 11"  (1.803 m), weight 243 lb (110.2 kg).Body mass index is 33.89 kg/m.  Others deferred for coronavirus pandemic  General Appearance: Casual, Well Groomed and Obese  Eye Contact:  Good  Speech:  Clear and Coherent, Normal Rate and Talkative  Volume:  Normal  Mood:  Anxious, Euthymic and Worthless  Affect:  Congruent, Inappropriate, Labile, Full Range and Anxious  Thought Process:  Goal Directed, Irrelevant and Linear  Orientation:  Full (Time, Place, and Person)  Thought Content: Ilusions, Obsessions and Rumination   Suicidal Thoughts:  No  Homicidal Thoughts:  No  Memory:  Immediate;   Fair Remote;   Fair  Judgement:  Impaired in past to now fair  Insight:  Lacking  Psychomotor Activity:  Normal, Increased, Mannerisms and Restlessness  Concentration:  Concentration: Fair and Attention Span: Fair  Recall:  [employer]  Fund of Knowledge: Good  Language: Good  Assets:  Desire for Improvement Leisure Time Resilience Talents/Skills  ADL's:  Intact  Cognition: WNL  Prognosis:  Poor to today fair    DIAGNOSES:    ICD-10-CM   1. Delusional disorder, somatic  type, multiple episodes, currently in acute episode (HCC)  F22   2. Generalized anxiety disorder  F41.1 clonazePAM (KLONOPIN) 2 MG tablet  3. Other stimulant dependence with stimulant-induced mood disorder (HCC)  F15.24   4. Attention deficit hyperactivity disorder (ADHD), combined type, severe  F90.2   5. Opioid use disorder, severe, on maintenance therapy, dependence (HCC)  F11.20   6. Binge-eating disorder, moderate  F50.81   7. Restless legs syndrome  G25.81     Receiving Psychotherapy: Yes  Evelena PeatAlex Bass, LPCS, LCAS   RECOMMENDATIONS: Psychosupportive psychoeducation reworks his symptom treatment matching as for the last 6 to 12 months, he and adoptive mother have been structuring her dosing of his Klonopin at home from 1/4 to 1 tablet as needed for agitation or panic anxiety.  As he is recently out of [facility], I continue the established treatment regimen from there which was initially my orders for medications then dosed as possible by staff in their program.  I escribe Klonopin 2 mg twice daily as needed for severe anxiety or agitation to [facility] as [facility] with 3 refills as per [facility] treatment plan.  I also provided samples of the Latuda 80 mg tablet to take 1/2 tablet every morning after breakfast as #56 samples therefore 4 month supply.  The patient has current supply of gabapentin 600 mg 4 times daily for anxiety, RLS, and chronic pain.  He has  current supply of Effexor 75 mg XR taking 2 capsuless for total 150 mg daily for generalized and posttraumatic anxiety and ADHD.  He is taking trazodone 150 mg tablet as 2 tablets total 300 mg every bedtime having current supply for insomnia.  He takes Inderal 160 mg LA daily after breakfast having current supply for generalized anxiety and PTSD.  In accord with his treatment plan at discharge from the [facility], we will schedule next follow-up in 4 months relative to medications though return sooner if needed as patient expects to be off of Suboxone and Focalin from  his chronic pain and substance use treatment of the past and has the goal to stop making changes in his medication regimen and to cope by therapeutic principles he has acquired in treatment.   Burnadette Peter, MD

## 2018-08-25 ENCOUNTER — Encounter: Payer: Self-pay | Admitting: Psychiatry

## 2018-08-25 MED ORDER — LURASIDONE HCL 80 MG PO TABS: 40.0000 mg | ORAL_TABLET | Freq: Every day | ORAL | 0 refills | Status: DC

## 2018-09-03 ENCOUNTER — Other Ambulatory Visit: Payer: Self-pay | Admitting: Psychiatry

## 2018-09-03 DIAGNOSIS — F411 Generalized anxiety disorder: Secondary | ICD-10-CM

## 2018-09-09 ENCOUNTER — Other Ambulatory Visit: Payer: Self-pay | Admitting: Psychiatry

## 2018-09-11 DIAGNOSIS — F411 Generalized anxiety disorder: Secondary | ICD-10-CM | POA: Diagnosis not present

## 2018-09-11 DIAGNOSIS — F319 Bipolar disorder, unspecified: Secondary | ICD-10-CM | POA: Diagnosis not present

## 2018-09-11 DIAGNOSIS — F909 Attention-deficit hyperactivity disorder, unspecified type: Secondary | ICD-10-CM | POA: Diagnosis not present

## 2018-09-11 DIAGNOSIS — Z79899 Other long term (current) drug therapy: Secondary | ICD-10-CM | POA: Diagnosis not present

## 2018-09-18 DIAGNOSIS — Z79899 Other long term (current) drug therapy: Secondary | ICD-10-CM | POA: Diagnosis not present

## 2018-09-25 DIAGNOSIS — Z79899 Other long term (current) drug therapy: Secondary | ICD-10-CM | POA: Diagnosis not present

## 2018-10-03 ENCOUNTER — Other Ambulatory Visit: Payer: Self-pay | Admitting: Psychiatry

## 2018-10-03 DIAGNOSIS — F411 Generalized anxiety disorder: Secondary | ICD-10-CM

## 2018-10-11 ENCOUNTER — Other Ambulatory Visit: Payer: Self-pay

## 2018-10-11 ENCOUNTER — Emergency Department (HOSPITAL_COMMUNITY)

## 2018-10-11 ENCOUNTER — Emergency Department (HOSPITAL_COMMUNITY)
Admission: EM | Admit: 2018-10-11 | Discharge: 2018-10-11 | Disposition: A | Discharge status: Home / Self Care | Attending: Emergency Medicine | Admitting: Emergency Medicine

## 2018-10-11 ENCOUNTER — Encounter (HOSPITAL_COMMUNITY): Payer: Self-pay

## 2018-10-11 DIAGNOSIS — Z23 Encounter for immunization: Secondary | ICD-10-CM | POA: Diagnosis not present

## 2018-10-11 DIAGNOSIS — Y92143 Cell of prison as the place of occurrence of the external cause: Secondary | ICD-10-CM | POA: Insufficient documentation

## 2018-10-11 DIAGNOSIS — I10 Essential (primary) hypertension: Secondary | ICD-10-CM | POA: Diagnosis not present

## 2018-10-11 DIAGNOSIS — Z79899 Other long term (current) drug therapy: Secondary | ICD-10-CM | POA: Insufficient documentation

## 2018-10-11 DIAGNOSIS — F13239 Sedative, hypnotic or anxiolytic dependence with withdrawal, unspecified: Secondary | ICD-10-CM | POA: Diagnosis not present

## 2018-10-11 DIAGNOSIS — F13939 Sedative, hypnotic or anxiolytic use, unspecified with withdrawal, unspecified: Secondary | ICD-10-CM

## 2018-10-11 DIAGNOSIS — S0101XA Laceration without foreign body of scalp, initial encounter: Secondary | ICD-10-CM | POA: Diagnosis not present

## 2018-10-11 DIAGNOSIS — R52 Pain, unspecified: Secondary | ICD-10-CM | POA: Diagnosis not present

## 2018-10-11 DIAGNOSIS — F17210 Nicotine dependence, cigarettes, uncomplicated: Secondary | ICD-10-CM | POA: Insufficient documentation

## 2018-10-11 DIAGNOSIS — R404 Transient alteration of awareness: Secondary | ICD-10-CM | POA: Diagnosis not present

## 2018-10-11 DIAGNOSIS — Y999 Unspecified external cause status: Secondary | ICD-10-CM | POA: Insufficient documentation

## 2018-10-11 DIAGNOSIS — R569 Unspecified convulsions: Secondary | ICD-10-CM | POA: Diagnosis not present

## 2018-10-11 DIAGNOSIS — R55 Syncope and collapse: Secondary | ICD-10-CM

## 2018-10-11 DIAGNOSIS — Y939 Activity, unspecified: Secondary | ICD-10-CM | POA: Insufficient documentation

## 2018-10-11 DIAGNOSIS — W06XXXA Fall from bed, initial encounter: Secondary | ICD-10-CM | POA: Insufficient documentation

## 2018-10-11 LAB — CBC WITH DIFFERENTIAL/PLATELET
Abs Immature Granulocytes: 0.09 10*3/uL — ABNORMAL HIGH (ref 0.00–0.07)
Basophils Absolute: 0.1 10*3/uL (ref 0.0–0.1)
Basophils Relative: 0 %
Eosinophils Absolute: 0 10*3/uL (ref 0.0–0.5)
Eosinophils Relative: 0 %
HCT: 47.3 % (ref 39.0–52.0)
Hemoglobin: 15.4 g/dL (ref 13.0–17.0)
Immature Granulocytes: 1 %
Lymphocytes Relative: 13 %
Lymphs Abs: 2 10*3/uL (ref 0.7–4.0)
MCH: 30.2 pg (ref 26.0–34.0)
MCHC: 32.6 g/dL (ref 30.0–36.0)
MCV: 92.7 fL (ref 80.0–100.0)
Monocytes Absolute: 1.2 10*3/uL — ABNORMAL HIGH (ref 0.1–1.0)
Monocytes Relative: 8 %
Neutro Abs: 12.3 10*3/uL — ABNORMAL HIGH (ref 1.7–7.7)
Neutrophils Relative %: 78 %
Platelets: 327 10*3/uL (ref 150–400)
RBC: 5.1 MIL/uL (ref 4.22–5.81)
RDW: 12.7 % (ref 11.5–15.5)
WBC: 15.6 10*3/uL — ABNORMAL HIGH (ref 4.0–10.5)
nRBC: 0 % (ref 0.0–0.2)

## 2018-10-11 LAB — RAPID URINE DRUG SCREEN, HOSP PERFORMED
Amphetamines: NOT DETECTED
Barbiturates: NOT DETECTED
Benzodiazepines: POSITIVE — AB
Cocaine: NOT DETECTED
Opiates: NOT DETECTED
Tetrahydrocannabinol: POSITIVE — AB

## 2018-10-11 LAB — BASIC METABOLIC PANEL
Anion gap: 12 (ref 5–15)
BUN: 15 mg/dL (ref 6–20)
CO2: 24 mmol/L (ref 22–32)
Calcium: 9.4 mg/dL (ref 8.9–10.3)
Chloride: 104 mmol/L (ref 98–111)
Creatinine, Ser: 1.07 mg/dL (ref 0.61–1.24)
GFR calc Af Amer: >60 mL/min (ref >60)
GFR calc non Af Amer: >60 mL/min (ref >60)
Glucose, Bld: 106 mg/dL — ABNORMAL HIGH (ref 70–99)
Potassium: 3.7 mmol/L (ref 3.5–5.1)
Sodium: 140 mmol/L (ref 135–145)

## 2018-10-11 LAB — CBG MONITORING, ED: Glucose-Capillary: 76 mg/dL (ref 70–99)

## 2018-10-11 LAB — URINALYSIS, ROUTINE W REFLEX MICROSCOPIC
Bilirubin Urine: NEGATIVE
Glucose, UA: NEGATIVE mg/dL
Hgb urine dipstick: NEGATIVE
Ketones, ur: 5 mg/dL — AB
Leukocytes,Ua: NEGATIVE
Nitrite: NEGATIVE
Protein, ur: NEGATIVE mg/dL
Specific Gravity, Urine: 1.027 (ref 1.005–1.030)
pH: 5 (ref 5.0–8.0)

## 2018-10-11 MED ORDER — CLONAZEPAM 0.5 MG PO TABS
0.5000 mg | ORAL_TABLET | Freq: Once | ORAL | Status: AC
  Administered 2018-10-11: 14:00:00 0.5 mg via ORAL
  Filled 2018-10-11: qty 1

## 2018-10-11 MED ORDER — LIDOCAINE HCL (PF) 1 % IJ SOLN
INTRAMUSCULAR | Status: AC
  Administered 2018-10-11: 13:00:00
  Filled 2018-10-11: qty 2

## 2018-10-11 MED ORDER — IBUPROFEN 800 MG PO TABS
800.0000 mg | ORAL_TABLET | Freq: Once | ORAL | Status: AC
  Administered 2018-10-11: 10:00:00 800 mg via ORAL
  Filled 2018-10-11: qty 1

## 2018-10-11 MED ORDER — LIDOCAINE HCL (PF) 2 % IJ SOLN: 2.0000 mL | Freq: Once | INTRAMUSCULAR | Status: DC

## 2018-10-11 MED ORDER — TETANUS-DIPHTH-ACELL PERTUSSIS 5-2.5-18.5 LF-MCG/0.5 IM SUSP
0.5000 mL | Freq: Once | INTRAMUSCULAR | Status: AC
  Administered 2018-10-11: 10:00:00 0.5 mL via INTRAMUSCULAR
  Filled 2018-10-11: qty 0.5

## 2018-10-11 MED ORDER — CLONAZEPAM 0.5 MG PO TABS: 0.5000 mg | ORAL_TABLET | Freq: Two times a day (BID) | ORAL | 0 refills | Status: DC

## 2018-10-11 NOTE — Discharge Instructions (Addendum)
Keep the area of the laceration clean with mild soap and water.  Staples out in 5 to 7 days.  Take ibuprofen 800 mg 3 times a day if needed for pain or headache.  Follow-up with your primary provider or return to the ER for any worsening symptoms.  Avoid heavy lifting or strenuous activities for at least 7 days.

## 2018-10-11 NOTE — ED Notes (Signed)
VS were taken by myself.  Pt is resting.  Officer at the bedside.

## 2018-10-11 NOTE — ED Triage Notes (Addendum)
Pt  Brought in by EMS from [place] [facility]. Inmate found laying on back and had fallen off top bunk . Apparently had a seizure reported by other inmate . Has 2 inch lac to back of head  Pt is on neurontin for seizure and meds are currently being tapered by his MD. . Pt reports no recent seizure

## 2018-10-12 NOTE — ED Provider Notes (Signed)
[facility] EMERGENCY DEPARTMENT Provider Note   CSN: [id number] Arrival date & time: 10/11/18  0744     History   Chief Complaint Chief Complaint  Patient presents with  . Seizures    HPI Steve Bass is a 38 y.o. male.     HPI   Steve Bass is a 38 y.o. male who is an inmate at a local correctional facility, presents to the Emergency Department with an officer complaining of a possible seizure.  He states that he was lying on the upper bunk bed and fell off.  He was found lying on the floor.  His cell mate states that he appeared to have had a seizure.  The pt states that he has been taking Clonazepam for "a long time" and has been without his medication for one week since being incarcerated.  He reports having a headache and a small laceration to the back of his scalp.  He reports having pain to his right face and right ribs secondary to the fall.  He does not recall the incident.  He denies vomiting, lethargy, numbness, visual changes, incontinence of urine, recent illness or fever.  Last Td is unknown    Past Medical History:  Diagnosis Date  . ADD (attention deficit disorder with hyperactivity)   . Anxiety and depression   . GERD (gastroesophageal reflux disease)    LA classification Grade C EE  . Obesity   . PTSD (post-traumatic stress disorder)   . Shingles     Patient Active Problem List   Diagnosis Date Noted  . Restless legs syndrome 03/21/2018  . Alcohol withdrawal delirium, acute, hyperactive (HCC) 02/21/2018  . Polysubstance abuse (HCC) 02/21/2018  . Delusional disorder, somatic type, multiple episodes, currently in acute episode (HCC) 10/17/2017  . Binge-eating disorder, moderate 10/17/2017  . Hypogonadism in male 05/28/2013  . Alcohol withdrawal (HCC) 04/08/2013  . Substance abuse withdrawal, with perceptual disturbance (HCC) 04/08/2013  . Encephalopathy acute 04/08/2013  . Opioid use disorder, severe, on maintenance therapy, dependence  (HCC) 11/28/2012  . Smoker 10/20/2012  . Polysubstance dependence (HCC) 09/10/2012  . Other stimulant dependence with stimulant-induced mood disorder (HCC) 09/10/2012  . Generalized anxiety disorder 09/10/2012  . PTSD (post-traumatic stress disorder) 09/10/2012  . GERD 01/21/2008  . WEIGHT LOSS-ABNORMAL 01/21/2008  . DIARRHEA 01/21/2008  . ABNORMAL TRANSAMINASE-LFT'S 01/21/2008  . SERUM ENZYME LEVELS, ABNORMAL 01/21/2008  . DIARRHEA OF PRESUMED INFECTIOUS ORIGIN 11/21/2007  . Attention deficit hyperactivity disorder (ADHD), combined type, severe 11/21/2007  . Hematemesis 11/21/2007  . Nausea with vomiting 11/21/2007    Past Surgical History:  Procedure Laterality Date  . WISDOM TOOTH EXTRACTION          Home Medications    Prior to Admission medications   Medication Sig Start Date End Date Taking? Authorizing Provider  buprenorphine-naloxone (SUBOXONE) 8-2 mg SUBL SL tablet Place 1 tablet under the tongue daily.    [provider]  clonazePAM (KLONOPIN) 0.5 MG tablet Take 1 tablet (0.5 mg total) by mouth 2 (two) times daily. 10/11/18   Lassie Demorest, PA-C  Dexmethylphenidate HCl 40 MG CP24 Take 40 mg by mouth daily. 01/30/18   [provider]  finasteride (PROPECIA) 1 MG tablet Take 1 mg by mouth daily.  06/10/13   [provider]  gabapentin (NEURONTIN) 600 MG tablet TAKE 1 TABLET (600 MG TOTAL) BY MOUTH 4 (FOUR) TIMES DAILY. 09/09/18   Chauncey MannJennings, Glenn E, MD  lurasidone (LATUDA) 80 MG TABS tablet Take 0.5 tablets (  40 mg total) by mouth daily with breakfast. 08/25/18   Chauncey Mann, MD  omeprazole (PRILOSEC) 40 MG capsule Take 1 capsule (40 mg total) by mouth daily. 05/06/18   Meryl Dare, MD  propranolol ER (INDERAL LA) 160 MG SR capsule TAKE 1 CAPSULE (160 MG TOTAL) BY MOUTH DAILY AFTER BREAKFAST. 10/08/18   Chauncey Mann, MD  traZODone (DESYREL) 150 MG tablet TAKE 2 TABLETS (300 MG TOTAL) BY MOUTH AT BEDTIME. 07/20/18   Chauncey Mann, MD   venlafaxine XR (EFFEXOR-XR) 75 MG 24 hr capsule Take 2 capsules (150 mg total) by mouth daily with breakfast. 07/24/18   Chauncey Mann, MD    Family History Family History  Adopted: Yes  Problem Relation Age of Onset  . Diabetes Paternal Grandfather     Social History Social History   Tobacco Use  . Smoking status: Current Some Day Smoker    Packs/day: 0.50    Years: 15.00    Pack years: 7.50    Types: Cigarettes    Last attempt to quit: 01/07/2013    Years since quitting: 5.7  . Smokeless tobacco: Never Used  . Tobacco comment: tobacco handout given 09/26/16  Substance Use Topics  . Alcohol use: Not Currently  . Drug use: Not Currently    Types: "Crack" cocaine, Marijuana, Oxycodone, Cocaine    Comment: heroine, percocets-patient states he quit 09/24/13     Allergies   Cefaclor   Review of Systems Review of Systems  Constitutional: Negative for appetite change, chills and fever.  HENT: Negative for facial swelling.   Eyes: Negative for pain and visual disturbance.  Respiratory: Negative for chest tightness and shortness of breath.   Cardiovascular: Negative for chest pain.  Gastrointestinal: Negative for abdominal pain, nausea and vomiting.  Genitourinary: Negative for dysuria.  Neurological: Positive for seizures, syncope and headaches. Negative for dizziness, weakness and numbness.  Psychiatric/Behavioral: Negative for confusion.     Physical Exam Updated Vital Signs BP (!) 154/84 (BP Location: Left Arm)   Pulse (!) 58   Temp 99 F (37.2 C) (Oral)   Resp 16   SpO2 98%   Physical Exam Vitals signs and nursing note reviewed.  Constitutional:      Appearance: Normal appearance.     Comments: Pt is alert, does not appear post-ictal  HENT:     Head:     Comments: Small hematoma of the occipital scalp with 1 cm superficial laceration.  Bleeding controlled. Local ttp of the right cheek and mandible.  No trismus or malocclusion.      Right Ear: External  ear normal. No hemotympanum.     Left Ear: External ear normal. No hemotympanum.     Mouth/Throat:     Mouth: Mucous membranes are moist.  Eyes:     Extraocular Movements: Extraocular movements intact.     Conjunctiva/sclera: Conjunctivae normal.     Pupils: Pupils are equal, round, and reactive to light.  Neck:     Musculoskeletal: Normal range of motion. No muscular tenderness.  Cardiovascular:     Rate and Rhythm: Normal rate and regular rhythm.     Pulses: Normal pulses.  Pulmonary:     Effort: Pulmonary effort is normal.     Breath sounds: Normal breath sounds.  Chest:     Chest wall: Tenderness (ttp of the right lateral ribs.  no bony deformity or crepitus.  ) present.  Abdominal:     General: There is no distension.  Palpations: Abdomen is soft.     Tenderness: There is no abdominal tenderness. There is no guarding.  Musculoskeletal:        General: Tenderness and signs of injury present.  Skin:    General: Skin is warm.     Capillary Refill: Capillary refill takes less than 2 seconds.     Findings: No erythema.  Neurological:     General: No focal deficit present.     Mental Status: He is alert and oriented to person, place, and time.     GCS: GCS eye subscore is 4. GCS verbal subscore is 5. GCS motor subscore is 6.     Sensory: Sensation is intact. No sensory deficit.     Motor: Motor function is intact. No weakness.     Coordination: Coordination is intact.     Comments: CN II-XII intact.  Speech clear.  No pronator drift.    Psychiatric:        Thought Content: Thought content normal.      ED Treatments / Results  Labs (all labs ordered are listed, but only abnormal results are displayed) Labs Reviewed  BASIC METABOLIC PANEL - Abnormal; Notable for the following components:      Result Value   Glucose, Bld 106 (*)    All other components within normal limits  CBC WITH DIFFERENTIAL/PLATELET - Abnormal; Notable for the following components:   WBC 15.6  (*)    Neutro Abs 12.3 (*)    Monocytes Absolute 1.2 (*)    Abs Immature Granulocytes 0.09 (*)    All other components within normal limits  URINALYSIS, ROUTINE W REFLEX MICROSCOPIC - Abnormal; Notable for the following components:   Color, Urine AMBER (*)    Ketones, ur 5 (*)    All other components within normal limits  RAPID URINE DRUG SCREEN, HOSP PERFORMED - Abnormal; Notable for the following components:   Benzodiazepines POSITIVE (*)    Tetrahydrocannabinol POSITIVE (*)    All other components within normal limits  CBG MONITORING, ED    EKG None  Radiology Dg Ribs Unilateral W/chest Right  Result Date: 10/11/2018 CLINICAL DATA:  Pain following fall EXAM: RIGHT RIBS AND CHEST - 3+ VIEW COMPARISON:  September 03, 2012 FINDINGS: Frontal chest as well as oblique and cone-down rib images were obtained. There is no edema or consolidation. Heart size and pulmonary vascularity are normal. No adenopathy. There is no pneumothorax or pleural effusion. No rib fracture evident. IMPRESSION: No rib fracture evident. No edema or consolidation. No pneumothorax. Electronically Signed   By: Lowella Grip III M.D.   On: 10/11/2018 10:49   Ct Head Wo Contrast  Result Date: 10/11/2018 CLINICAL DATA:  Facial trauma, fall out of bed, seizure EXAM: CT HEAD WITHOUT CONTRAST CT MAXILLOFACIAL WITHOUT CONTRAST CT CERVICAL SPINE WITHOUT CONTRAST TECHNIQUE: Multidetector CT imaging of the head, cervical spine, and maxillofacial structures were performed using the standard protocol without intravenous contrast. Multiplanar CT image reconstructions of the cervical spine and maxillofacial structures were also generated. COMPARISON:  None. FINDINGS: CT HEAD FINDINGS Brain: No evidence of acute infarction, hemorrhage, hydrocephalus, extra-axial collection or mass lesion/mass effect. Vascular: No hyperdense vessel or unexpected calcification. CT FACIAL BONES FINDINGS Skull: Normal. Negative for fracture or focal  lesion. Facial bones: No displaced fractures or dislocations. Sinuses/Orbits: No acute finding. Other: Soft tissue contusion and hematoma of the right scalp vertex. CT CERVICAL SPINE FINDINGS Alignment: Normal. Skull base and vertebrae: No acute fracture. No primary bone lesion or focal  pathologic process. Soft tissues and spinal canal: No prevertebral fluid or swelling. No visible canal hematoma. Disc levels:  Intact. Upper chest: Negative. Other: None. IMPRESSION: 1.  No acute intracranial pathology. 2.  Soft tissue contusion and hematoma of the right scalp vertex. 3.  No fracture or dislocation of the facial bones. 4.  No fracture or static subluxation of the cervical spine. Electronically Signed   By: Lauralyn Primes M.D.   On: 10/11/2018 11:58   Ct Cervical Spine Wo Contrast  Result Date: 10/11/2018 CLINICAL DATA:  Facial trauma, fall out of bed, seizure EXAM: CT HEAD WITHOUT CONTRAST CT MAXILLOFACIAL WITHOUT CONTRAST CT CERVICAL SPINE WITHOUT CONTRAST TECHNIQUE: Multidetector CT imaging of the head, cervical spine, and maxillofacial structures were performed using the standard protocol without intravenous contrast. Multiplanar CT image reconstructions of the cervical spine and maxillofacial structures were also generated. COMPARISON:  None. FINDINGS: CT HEAD FINDINGS Brain: No evidence of acute infarction, hemorrhage, hydrocephalus, extra-axial collection or mass lesion/mass effect. Vascular: No hyperdense vessel or unexpected calcification. CT FACIAL BONES FINDINGS Skull: Normal. Negative for fracture or focal lesion. Facial bones: No displaced fractures or dislocations. Sinuses/Orbits: No acute finding. Other: Soft tissue contusion and hematoma of the right scalp vertex. CT CERVICAL SPINE FINDINGS Alignment: Normal. Skull base and vertebrae: No acute fracture. No primary bone lesion or focal pathologic process. Soft tissues and spinal canal: No prevertebral fluid or swelling. No visible canal hematoma. Disc  levels:  Intact. Upper chest: Negative. Other: None. IMPRESSION: 1.  No acute intracranial pathology. 2.  Soft tissue contusion and hematoma of the right scalp vertex. 3.  No fracture or dislocation of the facial bones. 4.  No fracture or static subluxation of the cervical spine. Electronically Signed   By: Lauralyn Primes M.D.   On: 10/11/2018 11:58   Ct Maxillofacial Wo Contrast  Result Date: 10/11/2018 CLINICAL DATA:  Facial trauma, fall out of bed, seizure EXAM: CT HEAD WITHOUT CONTRAST CT MAXILLOFACIAL WITHOUT CONTRAST CT CERVICAL SPINE WITHOUT CONTRAST TECHNIQUE: Multidetector CT imaging of the head, cervical spine, and maxillofacial structures were performed using the standard protocol without intravenous contrast. Multiplanar CT image reconstructions of the cervical spine and maxillofacial structures were also generated. COMPARISON:  None. FINDINGS: CT HEAD FINDINGS Brain: No evidence of acute infarction, hemorrhage, hydrocephalus, extra-axial collection or mass lesion/mass effect. Vascular: No hyperdense vessel or unexpected calcification. CT FACIAL BONES FINDINGS Skull: Normal. Negative for fracture or focal lesion. Facial bones: No displaced fractures or dislocations. Sinuses/Orbits: No acute finding. Other: Soft tissue contusion and hematoma of the right scalp vertex. CT CERVICAL SPINE FINDINGS Alignment: Normal. Skull base and vertebrae: No acute fracture. No primary bone lesion or focal pathologic process. Soft tissues and spinal canal: No prevertebral fluid or swelling. No visible canal hematoma. Disc levels:  Intact. Upper chest: Negative. Other: None. IMPRESSION: 1.  No acute intracranial pathology. 2.  Soft tissue contusion and hematoma of the right scalp vertex. 3.  No fracture or dislocation of the facial bones. 4.  No fracture or static subluxation of the cervical spine. Electronically Signed   By: Lauralyn Primes M.D.   On: 10/11/2018 11:58    Procedures Procedures (including critical care  time)   LACERATION REPAIR Performed by: Arizona Sorn Authorized by: Viviane Semidey Consent: Verbal consent obtained. Risks and benefits: risks, benefits and alternatives were discussed Consent given by: patient Patient identity confirmed: provided demographic data Prepped and Draped in normal sterile fashion Wound explored  Laceration Location: scalp  Laceration Length:  1 cm  No Foreign Bodies seen or palpated  Anesthesia: local infiltration  Local anesthetic: lidocaine 1% w/o epinephrine  Anesthetic total: 2 ml  Irrigation method: syringe Amount of cleaning: standard  Skin closure: staples  Number of staples:  1  Technique: stapling  Patient tolerance: Patient tolerated the procedure well with no immediate complications.  Medications Ordered in ED Medications  ibuprofen (ADVIL) tablet 800 mg (800 mg Oral Given 10/11/18 1027)  Tdap (BOOSTRIX) injection 0.5 mL (0.5 mLs Intramuscular Given 10/11/18 1028)  lidocaine (PF) (XYLOCAINE) 1 % injection (  Given by Other 10/11/18 1255)  clonazePAM (KLONOPIN) tablet 0.5 mg (0.5 mg Oral Given 10/11/18 1352)     Initial Impression / Assessment and Plan / ED Course  I have reviewed the triage vital signs and the nursing notes.  Pertinent labs & imaging results that were available during my care of the patient were reviewed by me and considered in my medical decision making (see chart for details).        Pt with likely seizure secondary to benzo withdrawal.  He is well appearing, ambulatory with steady gait and does not appear post-ictal.  Small lac to the occipital scalp closed with a single staple.  Td updated.  He is in custody of a Emergency planning/management officer.  Reports feeling better.  No focal neuro deficits and work up is reassuring.  I will prescribe a small amt of low dose benzo.  He agrees to wound care instructions and staple out in 5-7 days.  Return precautions and head injury instructions discussed.   Final Clinical  Impressions(s) / ED Diagnoses   Final diagnoses:  Benzodiazepine withdrawal with complication (HCC)  Syncope, unspecified syncope type  Laceration of scalp, initial encounter    ED Discharge Orders         Ordered    clonazePAM (KLONOPIN) 0.5 MG tablet  2 times daily     10/11/18 1336           Pauline Aus, PA-C 10/12/18 2200    Blane Ohara, MD 10/14/18 [phone]

## 2018-10-13 ENCOUNTER — Other Ambulatory Visit: Payer: Self-pay | Admitting: Psychiatry

## 2018-10-13 DIAGNOSIS — F411 Generalized anxiety disorder: Secondary | ICD-10-CM

## 2018-10-14 NOTE — Telephone Encounter (Signed)
Fyi see ER visit on 10/11/2018

## 2018-10-14 NOTE — Telephone Encounter (Signed)
[facility] requests refill appropriate in time for trazodone 150 mg taking 2 every bedtime #180 with no refill due in early October for his 15-month supply.  However he is apparently incarcerated per ED note 10/11/2018 rolling off the bunk with a small laceration to the occipital scalp peer cellmate considering this a seizure from benzodiazepine withdrawal reporting he has had none for a week per ED giving no treatment except wound care and 0.5 mg Klonopin small amount when he has eScription from 08/20/2018 to [place] for 2 mg twice a day #60 with 3 refill sent last appt.

## 2018-10-16 ENCOUNTER — Telehealth: Payer: Self-pay | Admitting: Psychiatry

## 2018-10-16 NOTE — Telephone Encounter (Signed)
Adoptive mother phones needing to talk over her family plans for [name]'s relapse again after possibly for a month or 2 in cocaine, abusing the Focalin he obtains from an associate of Duwaine Maxin, and other drugs as he can get them.  He was violent toward adoptive parents being arrested by the sheriff at the family home now with a second appearance before the judge having a 30 to 90-day placement in [place] that may be followed by [facility] in [place] or [place] in [place] after that as halfway house type extensions of his developmental restoration once having sustained sobriety.  Relative closure upon more recent treatment issues his assimilated by mother who can then turn over to spread the responsibility to use the upcoming placement toward a new and safer life.

## 2018-10-31 ENCOUNTER — Other Ambulatory Visit: Payer: Self-pay | Admitting: Psychiatry

## 2018-10-31 DIAGNOSIS — F411 Generalized anxiety disorder: Secondary | ICD-10-CM

## 2018-11-25 DIAGNOSIS — F418 Other specified anxiety disorders: Secondary | ICD-10-CM | POA: Diagnosis not present

## 2018-11-25 DIAGNOSIS — F19939 Other psychoactive substance use, unspecified with withdrawal, unspecified: Secondary | ICD-10-CM | POA: Diagnosis not present

## 2018-11-25 DIAGNOSIS — R569 Unspecified convulsions: Secondary | ICD-10-CM | POA: Diagnosis not present

## 2018-11-25 DIAGNOSIS — F1420 Cocaine dependence, uncomplicated: Secondary | ICD-10-CM | POA: Diagnosis not present

## 2018-12-05 ENCOUNTER — Other Ambulatory Visit: Payer: Self-pay

## 2018-12-05 DIAGNOSIS — F411 Generalized anxiety disorder: Secondary | ICD-10-CM

## 2018-12-05 MED ORDER — PROPRANOLOL HCL ER 160 MG PO CP24: 160.0000 mg | ORAL_CAPSULE | Freq: Every day | ORAL | 0 refills | Status: DC

## 2018-12-05 NOTE — Telephone Encounter (Signed)
Insurance request a 90 day Rx for patient's Propranolol ER 160 mg 1 capsule daily. [facility].

## 2018-12-19 ENCOUNTER — Ambulatory Visit: Payer: BC Managed Care – PPO | Admitting: Psychiatry

## 2018-12-23 ENCOUNTER — Telehealth: Payer: Self-pay | Admitting: Psychiatry

## 2018-12-23 NOTE — Telephone Encounter (Signed)
Steve Bass (mom) called to report that Steve Bass has been in program in [place] since Sept.  She knows that missed a recent appt. She just failed to remember it was scheduled and didn't call to cancel.  She wants you know that he in this program and not sure when it will finish.  They are handling all his medications there.  She would like to give you an update if you have not already received information on his status.  Her number is [phone]

## 2018-12-23 NOTE — Telephone Encounter (Signed)
Adoptive mother Steve Bass [phone] phones about missed appointment last week reassured by me on return call not charged as she had phoned [place] that Steve Bass was entering residential care but she did not cancel the existing appointment from 3 months ago nor did staff realize that appointment was there at the time.  She informs me that Steve Bass is in a [facility]-quarter way house and will be there for a long time possibly to never return to this social and medical environment.  He has an addictionologist primary care and psychiatrist/neurologist working with him on character structure, medications, and personal development in his recovery.  He is on all different medications so that all medications of the psychiatric nature in epic are discontinued for him today as he has a change in therapy, though I did not discontinue the Suboxone as never managed by me in any way.  I explained to adoptive mother that Steve Bass associates my practice with Dr. Sabra Heck whom he seemed to consider supported [name] in his past substitution treatment of his addictions and reminded adoptive mother that Steve Bass would have her call me to make sure he could get his Klonopin, Neurontin, Effexor, trazodone, Latuda and also attempted the Focalin I refused.  She agrees and I agree to close his care here and to wish him well in the future needing totally new staff and structure for aftercare should he return to this area to apply and work his newly established recovery program.

## 2019-01-11 ENCOUNTER — Other Ambulatory Visit: Payer: Self-pay | Admitting: Psychiatry

## 2019-01-11 DIAGNOSIS — F411 Generalized anxiety disorder: Secondary | ICD-10-CM

## 2019-01-31 DIAGNOSIS — F411 Generalized anxiety disorder: Secondary | ICD-10-CM | POA: Diagnosis not present

## 2019-02-13 DIAGNOSIS — K529 Noninfective gastroenteritis and colitis, unspecified: Secondary | ICD-10-CM | POA: Diagnosis not present

## 2019-02-14 DIAGNOSIS — Z03818 Encounter for observation for suspected exposure to other biological agents ruled out: Secondary | ICD-10-CM | POA: Diagnosis not present

## 2019-02-14 DIAGNOSIS — Z20828 Contact with and (suspected) exposure to other viral communicable diseases: Secondary | ICD-10-CM | POA: Diagnosis not present

## 2019-02-25 DIAGNOSIS — Z20822 Contact with and (suspected) exposure to covid-19: Secondary | ICD-10-CM | POA: Diagnosis not present

## 2019-02-28 DIAGNOSIS — F411 Generalized anxiety disorder: Secondary | ICD-10-CM | POA: Diagnosis not present

## 2019-03-18 DIAGNOSIS — Z23 Encounter for immunization: Secondary | ICD-10-CM | POA: Diagnosis not present

## 2019-04-01 ENCOUNTER — Other Ambulatory Visit: Payer: Self-pay | Admitting: Family Medicine

## 2019-04-08 DIAGNOSIS — Z23 Encounter for immunization: Secondary | ICD-10-CM | POA: Diagnosis not present

## 2019-04-16 DIAGNOSIS — B349 Viral infection, unspecified: Secondary | ICD-10-CM | POA: Diagnosis not present

## 2019-04-16 DIAGNOSIS — Z20822 Contact with and (suspected) exposure to covid-19: Secondary | ICD-10-CM | POA: Diagnosis not present

## 2019-04-22 DIAGNOSIS — J40 Bronchitis, not specified as acute or chronic: Secondary | ICD-10-CM | POA: Diagnosis not present

## 2019-05-23 ENCOUNTER — Other Ambulatory Visit: Payer: Self-pay | Admitting: Gastroenterology

## 2019-08-16 DIAGNOSIS — M79672 Pain in left foot: Secondary | ICD-10-CM | POA: Diagnosis not present

## 2019-08-28 DIAGNOSIS — F411 Generalized anxiety disorder: Secondary | ICD-10-CM | POA: Diagnosis not present

## 2019-10-29 DIAGNOSIS — F1920 Other psychoactive substance dependence, uncomplicated: Secondary | ICD-10-CM | POA: Diagnosis not present

## 2019-11-05 ENCOUNTER — Encounter: Payer: Self-pay | Admitting: Psychiatry

## 2019-11-05 DIAGNOSIS — F1920 Other psychoactive substance dependence, uncomplicated: Secondary | ICD-10-CM | POA: Diagnosis not present

## 2019-11-19 DIAGNOSIS — F1120 Opioid dependence, uncomplicated: Secondary | ICD-10-CM | POA: Diagnosis not present

## 2019-11-21 DIAGNOSIS — F1120 Opioid dependence, uncomplicated: Secondary | ICD-10-CM | POA: Diagnosis not present

## 2019-12-18 DIAGNOSIS — F1120 Opioid dependence, uncomplicated: Secondary | ICD-10-CM | POA: Diagnosis not present

## 2019-12-22 DIAGNOSIS — F1120 Opioid dependence, uncomplicated: Secondary | ICD-10-CM | POA: Diagnosis not present

## 2020-01-02 ENCOUNTER — Ambulatory Visit: Payer: Self-pay | Admitting: Family Medicine

## 2020-01-20 DIAGNOSIS — F1121 Opioid dependence, in remission: Secondary | ICD-10-CM | POA: Diagnosis not present

## 2020-01-20 DIAGNOSIS — F1120 Opioid dependence, uncomplicated: Secondary | ICD-10-CM | POA: Diagnosis not present

## 2020-01-26 DIAGNOSIS — F1120 Opioid dependence, uncomplicated: Secondary | ICD-10-CM | POA: Diagnosis not present

## 2020-02-05 ENCOUNTER — Other Ambulatory Visit: Payer: Self-pay

## 2020-02-05 ENCOUNTER — Encounter: Payer: Self-pay | Admitting: Family Medicine

## 2020-02-05 ENCOUNTER — Ambulatory Visit (INDEPENDENT_AMBULATORY_CARE_PROVIDER_SITE_OTHER): Payer: BC Managed Care – PPO | Admitting: Family Medicine

## 2020-02-05 VITALS — BP 108/71 | HR 69 | Ht 73.0 in | Wt 323.0 lb

## 2020-02-05 DIAGNOSIS — F1910 Other psychoactive substance abuse, uncomplicated: Secondary | ICD-10-CM | POA: Diagnosis not present

## 2020-02-05 DIAGNOSIS — F22 Delusional disorders: Secondary | ICD-10-CM

## 2020-02-05 DIAGNOSIS — F1920 Other psychoactive substance dependence, uncomplicated: Secondary | ICD-10-CM | POA: Diagnosis not present

## 2020-02-05 DIAGNOSIS — Z23 Encounter for immunization: Secondary | ICD-10-CM

## 2020-02-05 DIAGNOSIS — F411 Generalized anxiety disorder: Secondary | ICD-10-CM

## 2020-02-05 DIAGNOSIS — F4310 Post-traumatic stress disorder, unspecified: Secondary | ICD-10-CM

## 2020-02-05 NOTE — Progress Notes (Signed)
BP 108/71   Pulse 69   Ht 6\' 1"  (1.854 m)   Wt (!) 323 lb (146.5 kg)   SpO2 93%   BMI 42.61 kg/m    Subjective:    Patient ID: , male    DOB: 1980/08/17, 40 y.o.   MRN: [id number]  HPI: Steve Bass is a 40 y.o. male presenting on 02/05/2020 for New Patient (Initial Visit)   HPI Patient is coming in to establish care with our office. He has been diagnosed with PTSD and ADHD and polysubstance abuse disorder including alcohol and opiates and other drugs as well. He has gone through multiple rehabs and then moved to [place] and has been to rehab up there. Most recently he was admitted to a rehab facility for alcohol use disorder. He says he has severe anxiety and PTSD from things that happened when he was younger and wants to reestablish with a psychiatrist in this area that and that he is back down here. Patient denies any suicidal ideations Depression screen [facility] 2/9 02/05/2020  Decreased Interest 0  Down, Depressed, Hopeless 0  PHQ - 2 Score 0     Relevant past medical, surgical, family and social history reviewed and updated as indicated. Interim medical history since our last visit reviewed. Allergies and medications reviewed and updated.  Review of Systems  Constitutional: Negative for chills and fever.  HENT: Negative for ear pain and tinnitus.   Eyes: Negative for pain and visual disturbance.  Respiratory: Negative for cough, shortness of breath and wheezing.   Cardiovascular: Negative for chest pain, palpitations and leg swelling.  Gastrointestinal: Negative for abdominal pain, blood in stool, constipation and diarrhea.  Genitourinary: Negative for dysuria and hematuria.  Musculoskeletal: Negative for back pain, gait problem and myalgias.  Skin: Negative for rash.  Neurological: Negative for dizziness, weakness and headaches.  Psychiatric/Behavioral: Positive for dysphoric mood. Negative for confusion, self-injury, sleep disturbance and suicidal  ideas. The patient is nervous/anxious.   All other systems reviewed and are negative.   Per HPI unless specifically indicated above  Social History   Socioeconomic History  . Marital status: Single    Spouse name: Not on file  . Number of children: 0  . Years of education: Not on file  . Highest education level: Not on file  Occupational History  . Occupation: 02/07/2020  Tobacco Use  . Smoking status: Current Some Day Smoker    Packs/day: 0.50    Years: 15.00    Pack years: 7.50    Types: Cigarettes    Last attempt to quit: 01/07/2013    Years since quitting: 7.0  . Smokeless tobacco: Never Used  . Tobacco comment: tobacco handout given 09/26/16  Vaping Use  . Vaping Use: Former  Substance and Sexual Activity  . Alcohol use: Not Currently  . Drug use: Not Currently    Types: "Crack" cocaine, Marijuana, Oxycodone, Cocaine    Comment: heroine, percocets-patient states he quit 09/24/13  . Sexual activity: Yes    Birth control/protection: Condom  Other Topics Concern  . Not on file  Social History Narrative  . Not on file   Social Determinants of Health   Financial Resource Strain: Not on file  Food Insecurity: Not on file  Transportation Needs: Not on file  Physical Activity: Not on file  Stress: Not on file  Social Connections: Not on file  Intimate Partner Violence: Not on file    Past Surgical History:  Procedure Laterality Date  .  WISDOM TOOTH EXTRACTION      Family History  Adopted: Yes  Problem Relation Age of Onset  . Diabetes Paternal Grandfather     Allergies as of 02/05/2020      Reactions   Cefaclor Rash      Medication List       Accurate as of February 05, 2020  2:54 PM. If you have any questions, ask your nurse or doctor.        STOP taking these medications   buprenorphine-naloxone 8-2 mg Subl SL tablet Commonly known as: SUBOXONE Stopped by: [name] Lynette Topete, MD     TAKE these medications   cloNIDine 0.1 MG tablet Commonly  known as: CATAPRES Take 0.1 mg by mouth 3 (three) times daily.   escitalopram 20 MG tablet Commonly known as: LEXAPRO Take 20 mg by mouth daily.   finasteride 1 MG tablet Commonly known as: PROPECIA Take 1 mg by mouth daily.   gabapentin 600 MG tablet Commonly known as: NEURONTIN Take 1-1.5 tablets by mouth in the morning, at noon, and at bedtime.   Latuda 20 MG Tabs tablet Generic drug: lurasidone Take 20 mg by mouth daily.   omeprazole 40 MG capsule Commonly known as: PRILOSEC Take 1 capsule (40 mg total) by mouth daily.   propranolol ER 160 MG SR capsule Commonly known as: INDERAL LA Take 160 mg by mouth daily.   QUEtiapine 25 MG tablet Commonly known as: SEROQUEL Take 25 mg by mouth at bedtime.          Objective:    Ht 6\' 1"  (1.854 m)   Wt (!) 323 lb (146.5 kg)   BMI 42.61 kg/m   Wt Readings from Last 3 Encounters:  02/05/20 (!) 323 lb (146.5 kg)  05/06/18 250 lb (113.4 kg)  02/22/18 275 lb (124.7 kg)    Physical Exam Vitals reviewed.  Constitutional:      General: He is not in acute distress.    Appearance: He is well-developed and well-nourished. He is not diaphoretic.  HENT:     Right Ear: External ear normal.     Left Ear: External ear normal.     Nose: Nose normal.     Mouth/Throat:     Mouth: Oropharynx is clear and moist.     Pharynx: No oropharyngeal exudate.  Eyes:     General: No scleral icterus.    Extraocular Movements: EOM normal.     Conjunctiva/sclera: Conjunctivae normal.  Neck:     Thyroid: No thyromegaly.  Cardiovascular:     Rate and Rhythm: Normal rate and regular rhythm.     Pulses: Intact distal pulses.     Heart sounds: Normal heart sounds. No murmur heard.   Pulmonary:     Effort: Pulmonary effort is normal. No respiratory distress.     Breath sounds: Normal breath sounds. No wheezing.  Abdominal:     General: Bowel sounds are normal. There is no distension.     Palpations: Abdomen is soft.     Tenderness: There  is no abdominal tenderness. There is no guarding or rebound.  Musculoskeletal:        General: No edema. Normal range of motion.     Cervical back: Neck supple.  Lymphadenopathy:     Cervical: No cervical adenopathy.  Skin:    General: Skin is warm and dry.     Findings: No rash.  Neurological:     Mental Status: He is alert and oriented to person, place, and  time.     Coordination: Coordination normal.  Psychiatric:        Mood and Affect: Mood is anxious and depressed.        Behavior: Behavior normal.        Thought Content: Thought content does not include suicidal ideation. Thought content does not include suicidal plan.       Assessment & Plan:   Problem List Items Addressed This Visit      Other   Generalized anxiety disorder - Primary (Chronic)   Relevant Medications   escitalopram (LEXAPRO) 20 MG tablet   Other Relevant Orders   Ambulatory referral to Psychiatry   Delusional disorder, somatic type, multiple episodes, currently in acute episode (HCC) (Chronic)   Relevant Orders   Ambulatory referral to Psychiatry   Polysubstance dependence [facility])   Relevant Orders   Ambulatory referral to Psychiatry   PTSD (post-traumatic stress disorder)   Relevant Medications   escitalopram (LEXAPRO) 20 MG tablet   Other Relevant Orders   Ambulatory referral to Psychiatry   Polysubstance abuse [facility])   Relevant Orders   Ambulatory referral to Psychiatry    Other Visit Diagnoses    Need for immunization against influenza       Relevant Orders   Flu Vaccine QUAD 36+ mos IM (Completed)      Patient will reestablish with psychiatry, he will follow-up with [place] in the future for any other medical issues. Follow up plan: Return in about 6 months (around 08/04/2020), or if symptoms worsen or fail to improve, for Well adult and physical.  Arville Care, MD [facility] 02/05/2020, 2:54 PM

## 2020-02-18 DIAGNOSIS — F1120 Opioid dependence, uncomplicated: Secondary | ICD-10-CM | POA: Diagnosis not present

## 2020-02-18 DIAGNOSIS — F1121 Opioid dependence, in remission: Secondary | ICD-10-CM | POA: Diagnosis not present

## 2020-02-20 DIAGNOSIS — F1120 Opioid dependence, uncomplicated: Secondary | ICD-10-CM | POA: Diagnosis not present

## 2020-02-20 DIAGNOSIS — F1121 Opioid dependence, in remission: Secondary | ICD-10-CM | POA: Diagnosis not present

## 2020-02-24 DIAGNOSIS — F1121 Opioid dependence, in remission: Secondary | ICD-10-CM | POA: Diagnosis not present

## 2020-03-03 DIAGNOSIS — F609 Personality disorder, unspecified: Secondary | ICD-10-CM | POA: Diagnosis not present

## 2020-03-03 DIAGNOSIS — G4700 Insomnia, unspecified: Secondary | ICD-10-CM | POA: Diagnosis not present

## 2020-03-03 DIAGNOSIS — Z79899 Other long term (current) drug therapy: Secondary | ICD-10-CM | POA: Diagnosis not present

## 2020-03-03 DIAGNOSIS — F3131 Bipolar disorder, current episode depressed, mild: Secondary | ICD-10-CM | POA: Diagnosis not present

## 2020-03-03 DIAGNOSIS — R5383 Other fatigue: Secondary | ICD-10-CM | POA: Diagnosis not present

## 2020-03-03 DIAGNOSIS — E559 Vitamin D deficiency, unspecified: Secondary | ICD-10-CM | POA: Diagnosis not present

## 2020-03-03 DIAGNOSIS — F4310 Post-traumatic stress disorder, unspecified: Secondary | ICD-10-CM | POA: Diagnosis not present

## 2020-03-03 DIAGNOSIS — F909 Attention-deficit hyperactivity disorder, unspecified type: Secondary | ICD-10-CM | POA: Diagnosis not present

## 2020-03-29 DIAGNOSIS — F1121 Opioid dependence, in remission: Secondary | ICD-10-CM | POA: Diagnosis not present

## 2020-03-31 DIAGNOSIS — Z79899 Other long term (current) drug therapy: Secondary | ICD-10-CM | POA: Diagnosis not present

## 2020-03-31 DIAGNOSIS — F3131 Bipolar disorder, current episode depressed, mild: Secondary | ICD-10-CM | POA: Diagnosis not present

## 2020-03-31 DIAGNOSIS — F4310 Post-traumatic stress disorder, unspecified: Secondary | ICD-10-CM | POA: Diagnosis not present

## 2020-03-31 DIAGNOSIS — F609 Personality disorder, unspecified: Secondary | ICD-10-CM | POA: Diagnosis not present

## 2020-03-31 DIAGNOSIS — G4700 Insomnia, unspecified: Secondary | ICD-10-CM | POA: Diagnosis not present

## 2020-04-20 DIAGNOSIS — F1120 Opioid dependence, uncomplicated: Secondary | ICD-10-CM | POA: Diagnosis not present

## 2020-04-20 DIAGNOSIS — F1121 Opioid dependence, in remission: Secondary | ICD-10-CM | POA: Diagnosis not present

## 2020-04-28 DIAGNOSIS — F1121 Opioid dependence, in remission: Secondary | ICD-10-CM | POA: Diagnosis not present

## 2020-04-29 DIAGNOSIS — G4700 Insomnia, unspecified: Secondary | ICD-10-CM | POA: Diagnosis not present

## 2020-04-29 DIAGNOSIS — F4310 Post-traumatic stress disorder, unspecified: Secondary | ICD-10-CM | POA: Diagnosis not present

## 2020-04-29 DIAGNOSIS — Z79899 Other long term (current) drug therapy: Secondary | ICD-10-CM | POA: Diagnosis not present

## 2020-04-29 DIAGNOSIS — F3130 Bipolar disorder, current episode depressed, mild or moderate severity, unspecified: Secondary | ICD-10-CM | POA: Diagnosis not present

## 2020-04-29 DIAGNOSIS — F609 Personality disorder, unspecified: Secondary | ICD-10-CM | POA: Diagnosis not present

## 2020-05-11 DIAGNOSIS — F4310 Post-traumatic stress disorder, unspecified: Secondary | ICD-10-CM | POA: Diagnosis not present

## 2020-05-11 DIAGNOSIS — F1120 Opioid dependence, uncomplicated: Secondary | ICD-10-CM | POA: Diagnosis not present

## 2020-05-11 DIAGNOSIS — Z79899 Other long term (current) drug therapy: Secondary | ICD-10-CM | POA: Diagnosis not present

## 2020-05-11 DIAGNOSIS — F3130 Bipolar disorder, current episode depressed, mild or moderate severity, unspecified: Secondary | ICD-10-CM | POA: Diagnosis not present

## 2020-05-11 DIAGNOSIS — G4700 Insomnia, unspecified: Secondary | ICD-10-CM | POA: Diagnosis not present

## 2020-05-11 DIAGNOSIS — F609 Personality disorder, unspecified: Secondary | ICD-10-CM | POA: Diagnosis not present

## 2020-05-11 DIAGNOSIS — F1121 Opioid dependence, in remission: Secondary | ICD-10-CM | POA: Diagnosis not present

## 2020-05-29 DIAGNOSIS — F609 Personality disorder, unspecified: Secondary | ICD-10-CM | POA: Diagnosis not present

## 2020-05-29 DIAGNOSIS — F4310 Post-traumatic stress disorder, unspecified: Secondary | ICD-10-CM | POA: Diagnosis not present

## 2020-05-29 DIAGNOSIS — Z79899 Other long term (current) drug therapy: Secondary | ICD-10-CM | POA: Diagnosis not present

## 2020-05-29 DIAGNOSIS — F3130 Bipolar disorder, current episode depressed, mild or moderate severity, unspecified: Secondary | ICD-10-CM | POA: Diagnosis not present

## 2020-05-29 DIAGNOSIS — G4700 Insomnia, unspecified: Secondary | ICD-10-CM | POA: Diagnosis not present

## 2020-06-02 DIAGNOSIS — F1121 Opioid dependence, in remission: Secondary | ICD-10-CM | POA: Diagnosis not present

## 2020-06-16 DIAGNOSIS — F3130 Bipolar disorder, current episode depressed, mild or moderate severity, unspecified: Secondary | ICD-10-CM | POA: Diagnosis not present

## 2020-06-16 DIAGNOSIS — F609 Personality disorder, unspecified: Secondary | ICD-10-CM | POA: Diagnosis not present

## 2020-06-16 DIAGNOSIS — F1120 Opioid dependence, uncomplicated: Secondary | ICD-10-CM | POA: Diagnosis not present

## 2020-06-16 DIAGNOSIS — G4700 Insomnia, unspecified: Secondary | ICD-10-CM | POA: Diagnosis not present

## 2020-06-16 DIAGNOSIS — F4310 Post-traumatic stress disorder, unspecified: Secondary | ICD-10-CM | POA: Diagnosis not present

## 2020-06-16 DIAGNOSIS — Z79899 Other long term (current) drug therapy: Secondary | ICD-10-CM | POA: Diagnosis not present

## 2020-06-16 DIAGNOSIS — F1121 Opioid dependence, in remission: Secondary | ICD-10-CM | POA: Diagnosis not present

## 2020-06-24 ENCOUNTER — Encounter: Payer: Self-pay | Admitting: Nurse Practitioner

## 2020-06-24 ENCOUNTER — Ambulatory Visit (INDEPENDENT_AMBULATORY_CARE_PROVIDER_SITE_OTHER): Payer: BC Managed Care – PPO | Admitting: Nurse Practitioner

## 2020-06-24 ENCOUNTER — Other Ambulatory Visit: Payer: Self-pay

## 2020-06-24 ENCOUNTER — Ambulatory Visit (INDEPENDENT_AMBULATORY_CARE_PROVIDER_SITE_OTHER): Payer: BC Managed Care – PPO

## 2020-06-24 VITALS — BP 115/79 | HR 57 | Temp 97.9°F | Resp 20 | Ht 73.0 in | Wt 302.0 lb

## 2020-06-24 DIAGNOSIS — S60051A Contusion of right little finger without damage to nail, initial encounter: Secondary | ICD-10-CM | POA: Diagnosis not present

## 2020-06-24 DIAGNOSIS — M25531 Pain in right wrist: Secondary | ICD-10-CM

## 2020-06-24 DIAGNOSIS — S6991XA Unspecified injury of right wrist, hand and finger(s), initial encounter: Secondary | ICD-10-CM | POA: Diagnosis not present

## 2020-06-24 MED ORDER — IBUPROFEN 600 MG PO TABS: 600.0000 mg | ORAL_TABLET | Freq: Three times a day (TID) | ORAL | 0 refills | Status: AC | PRN

## 2020-06-24 NOTE — Progress Notes (Signed)
   Subjective:    Patient ID: Steve Bass, male    DOB: [birth year], 40 y.o.   MRN: [id number]   Chief Complaint: Hand pain and swollen   HPI Patient was doing mixed martial arts and hit his hand on a pole. Is bruised and painful. This happened yesterday morning. Throbbed in pain all night long.    Review of Systems  Musculoskeletal:  Positive for arthralgias (right hand).  All other systems reviewed and are negative.     Objective:   Physical Exam Constitutional:      Appearance: Normal appearance. He is obese.  Musculoskeletal:     Comments: Right lateral hand contusion with pain on light palpation along 5 metacarpal. No movement of 5th finger due to pain  Skin:    General: Skin is warm.  Neurological:     General: No focal deficit present.     Mental Status: He is alert and oriented to person, place, and time.  Psychiatric:        Mood and Affect: Mood normal.    BP 115/79   Pulse (!) 57   Temp 97.9 F (36.6 C) (Temporal)   Resp 20   Ht 6\' 1"  (1.854 m)   Wt (!) 302 lb (137 kg)   SpO2 92%   BMI 39.84 kg/m   Right hand xray- no fracture visible-.Preliminary reading by , FNP  [facility]       Assessment & Plan:  [facility] in today with chief complaint of Hand pain and swollen   1. Right wrist pain - DG Hand Complete Right; Future  2. Contusion of right little finger without damage to nail, initial encounter Wrist hand brace applied Motrin OTC 3 regular strength every 6 hour Ice BID RTO prn Will call you if radiologist sees fracture.    The above assessment and management plan was discussed with the patient. The patient verbalized understanding of and has agreed to the management plan. Patient is aware to call the clinic if symptoms persist or worsen. Patient is aware when to return to the clinic for a follow-up visit. Patient educated on when it is appropriate to go to the emergency department.   Mary-Margaret Roxanna Mew, FNP

## 2020-06-24 NOTE — Patient Instructions (Signed)
Contusion A contusion is a deep bruise. This is a result of an injury that causes bleeding under the skin. Symptoms of bruising include pain, swelling, and discolored skin. The skin may turn blue, purple, or yellow. Follow these instructions at home: Managing pain, stiffness, and swelling You may use RICE. This stands for:  Resting.  Icing.  Compression, or putting pressure.  Elevating, or raising the injured area. To follow this method, do these actions:  Rest the injured area.  If told, put ice on the injured area. ? Put ice in a plastic bag. ? Place a towel between your skin and the bag. ? Leave the ice on for 20 minutes, 2-3 times per day.  If told, put light pressure (compression) on the injured area using an elastic bandage. Make sure the bandage is not too tight. If the area tingles or becomes numb, remove it and put it back on as told by your doctor.  If possible, raise (elevate) the injured area above the level of your heart while you are sitting or lying down.   General instructions  Take over-the-counter and prescription medicines only as told by your doctor.  Keep all follow-up visits as told by your doctor. This is important. Contact a doctor if:  Your symptoms do not get better after several days of treatment.  Your symptoms get worse.  You have trouble moving the injured area. Get help right away if:  You have very bad pain.  You have a loss of feeling (numbness) in a hand or foot.  Your hand or foot turns pale or cold. Summary  A contusion is a deep bruise. This is a result of an injury that causes bleeding under the skin.  Symptoms of bruising include pain, swelling, and discolored skin. The skin may turn blue, purple, or yellow.  This condition is treated with rest, ice, compression, and elevation. This is also called RICE. You may be given over-the-counter medicines for pain.  Contact a doctor if you do not feel better, or you feel worse. Get  help right away if you have very bad pain, have lost feeling in a hand or foot, or the area turns pale or cold. This information is not intended to replace advice given to you by your health care provider. Make sure you discuss any questions you have with your health care provider. Document Revised: 08/24/2017 Document Reviewed: 08/24/2017 Elsevier Patient Education  2021 [employer]vier Inc.  

## 2020-06-29 DIAGNOSIS — F3130 Bipolar disorder, current episode depressed, mild or moderate severity, unspecified: Secondary | ICD-10-CM | POA: Diagnosis not present

## 2020-06-29 DIAGNOSIS — G4700 Insomnia, unspecified: Secondary | ICD-10-CM | POA: Diagnosis not present

## 2020-06-29 DIAGNOSIS — F609 Personality disorder, unspecified: Secondary | ICD-10-CM | POA: Diagnosis not present

## 2020-06-29 DIAGNOSIS — F4310 Post-traumatic stress disorder, unspecified: Secondary | ICD-10-CM | POA: Diagnosis not present

## 2020-06-29 DIAGNOSIS — Z79899 Other long term (current) drug therapy: Secondary | ICD-10-CM | POA: Diagnosis not present

## 2020-07-05 DIAGNOSIS — F1121 Opioid dependence, in remission: Secondary | ICD-10-CM | POA: Diagnosis not present

## 2020-07-22 DIAGNOSIS — F1120 Opioid dependence, uncomplicated: Secondary | ICD-10-CM | POA: Diagnosis not present

## 2020-07-22 DIAGNOSIS — F1121 Opioid dependence, in remission: Secondary | ICD-10-CM | POA: Diagnosis not present

## 2020-07-29 DIAGNOSIS — F609 Personality disorder, unspecified: Secondary | ICD-10-CM | POA: Diagnosis not present

## 2020-07-29 DIAGNOSIS — F3130 Bipolar disorder, current episode depressed, mild or moderate severity, unspecified: Secondary | ICD-10-CM | POA: Diagnosis not present

## 2020-07-29 DIAGNOSIS — G4700 Insomnia, unspecified: Secondary | ICD-10-CM | POA: Diagnosis not present

## 2020-07-29 DIAGNOSIS — F4310 Post-traumatic stress disorder, unspecified: Secondary | ICD-10-CM | POA: Diagnosis not present

## 2020-07-29 DIAGNOSIS — Z79899 Other long term (current) drug therapy: Secondary | ICD-10-CM | POA: Diagnosis not present

## 2020-08-03 ENCOUNTER — Telehealth: Payer: Self-pay | Admitting: Family Medicine

## 2020-08-03 NOTE — Telephone Encounter (Signed)
Tried calling pt. No answer and vmail is not set up. Letter was created the day of his visit by Gennette Pac. Letter printed and placed up front for pt to pick up when returns call.

## 2020-08-04 DIAGNOSIS — F1121 Opioid dependence, in remission: Secondary | ICD-10-CM | POA: Diagnosis not present

## 2020-08-10 DIAGNOSIS — F1120 Opioid dependence, uncomplicated: Secondary | ICD-10-CM | POA: Diagnosis not present

## 2020-08-10 DIAGNOSIS — F1121 Opioid dependence, in remission: Secondary | ICD-10-CM | POA: Diagnosis not present

## 2020-08-11 NOTE — Telephone Encounter (Signed)
Patient never returned call, encounter closed 

## 2020-08-26 DIAGNOSIS — G4700 Insomnia, unspecified: Secondary | ICD-10-CM | POA: Diagnosis not present

## 2020-08-26 DIAGNOSIS — Z79899 Other long term (current) drug therapy: Secondary | ICD-10-CM | POA: Diagnosis not present

## 2020-08-26 DIAGNOSIS — F4310 Post-traumatic stress disorder, unspecified: Secondary | ICD-10-CM | POA: Diagnosis not present

## 2020-08-26 DIAGNOSIS — F609 Personality disorder, unspecified: Secondary | ICD-10-CM | POA: Diagnosis not present

## 2020-08-26 DIAGNOSIS — F3130 Bipolar disorder, current episode depressed, mild or moderate severity, unspecified: Secondary | ICD-10-CM | POA: Diagnosis not present

## 2020-09-01 DIAGNOSIS — F1121 Opioid dependence, in remission: Secondary | ICD-10-CM | POA: Diagnosis not present

## 2020-09-07 DIAGNOSIS — F1121 Opioid dependence, in remission: Secondary | ICD-10-CM | POA: Diagnosis not present

## 2020-09-07 DIAGNOSIS — F410 Panic disorder [episodic paroxysmal anxiety] without agoraphobia: Secondary | ICD-10-CM | POA: Diagnosis not present

## 2020-09-07 DIAGNOSIS — F4310 Post-traumatic stress disorder, unspecified: Secondary | ICD-10-CM | POA: Diagnosis not present

## 2020-09-07 DIAGNOSIS — F4010 Social phobia, unspecified: Secondary | ICD-10-CM | POA: Diagnosis not present

## 2020-09-07 DIAGNOSIS — F1120 Opioid dependence, uncomplicated: Secondary | ICD-10-CM | POA: Diagnosis not present

## 2020-09-23 DIAGNOSIS — Z79899 Other long term (current) drug therapy: Secondary | ICD-10-CM | POA: Diagnosis not present

## 2020-09-23 DIAGNOSIS — F3130 Bipolar disorder, current episode depressed, mild or moderate severity, unspecified: Secondary | ICD-10-CM | POA: Diagnosis not present

## 2020-09-23 DIAGNOSIS — F4310 Post-traumatic stress disorder, unspecified: Secondary | ICD-10-CM | POA: Diagnosis not present

## 2020-09-23 DIAGNOSIS — G4700 Insomnia, unspecified: Secondary | ICD-10-CM | POA: Diagnosis not present

## 2020-09-23 DIAGNOSIS — F17210 Nicotine dependence, cigarettes, uncomplicated: Secondary | ICD-10-CM | POA: Diagnosis not present

## 2020-09-23 DIAGNOSIS — F609 Personality disorder, unspecified: Secondary | ICD-10-CM | POA: Diagnosis not present

## 2020-09-23 DIAGNOSIS — F17200 Nicotine dependence, unspecified, uncomplicated: Secondary | ICD-10-CM | POA: Diagnosis not present

## 2020-09-27 DIAGNOSIS — Z79899 Other long term (current) drug therapy: Secondary | ICD-10-CM | POA: Diagnosis not present

## 2020-10-11 DIAGNOSIS — F1120 Opioid dependence, uncomplicated: Secondary | ICD-10-CM | POA: Diagnosis not present

## 2020-10-13 DIAGNOSIS — R21 Rash and other nonspecific skin eruption: Secondary | ICD-10-CM | POA: Diagnosis not present

## 2020-10-14 ENCOUNTER — Telehealth (INDEPENDENT_AMBULATORY_CARE_PROVIDER_SITE_OTHER): Payer: BC Managed Care – PPO | Admitting: Family Medicine

## 2020-10-14 NOTE — Progress Notes (Signed)
Attempted to call but no answer and no voicemail  Family called back but they are not on HIPAA so I could not speak to them about him Arville Care, MD [facility] 10/14/2020, 10:17 AM

## 2020-10-15 ENCOUNTER — Ambulatory Visit (INDEPENDENT_AMBULATORY_CARE_PROVIDER_SITE_OTHER): Payer: BC Managed Care – PPO | Admitting: Family Medicine

## 2020-10-15 ENCOUNTER — Other Ambulatory Visit: Payer: Self-pay

## 2020-10-15 ENCOUNTER — Encounter: Payer: Self-pay | Admitting: Family Medicine

## 2020-10-15 VITALS — BP 123/90 | HR 93 | Ht 73.0 in | Wt 300.0 lb

## 2020-10-15 DIAGNOSIS — B86 Scabies: Secondary | ICD-10-CM

## 2020-10-15 MED ORDER — LIDOCAINE 3 % EX CREA: 1.0000 "application " | TOPICAL_CREAM | Freq: Two times a day (BID) | CUTANEOUS | 0 refills | Status: DC | PRN

## 2020-10-15 MED ORDER — PERMETHRIN 5 % EX CREA: 1.0000 "application " | TOPICAL_CREAM | Freq: Once | CUTANEOUS | 0 refills | Status: AC

## 2020-10-15 MED ORDER — TRIAMCINOLONE ACETONIDE 0.1 % EX CREA: 1.0000 "application " | TOPICAL_CREAM | Freq: Two times a day (BID) | CUTANEOUS | 0 refills | Status: DC

## 2020-10-15 NOTE — Progress Notes (Signed)
BP 123/90   Pulse 93   Ht 6\' 1"  (1.854 m)   Wt 300 lb (136.1 kg)   SpO2 95%   BMI 39.58 kg/m    Subjective:   Patient ID: , male    DOB: Jun 23, 1980, 40 y.o.   MRN: [id number]  HPI: Steve Bass is a 40 y.o. male presenting on 10/15/2020 for Rash (All over, thinks scabies)   HPI Rash Patient is coming in with rash all over his body but mainly on his arms and his legs, he feels like he is got scabies and is noticed some little mites and his girlfriend has scabies 2.  He wanted to come in to get treatment but he just been very itchy and stinging and burning and painful and wants something to help with that as well.  Relevant past medical, surgical, family and social history reviewed and updated as indicated. Interim medical history since our last visit reviewed. Allergies and medications reviewed and updated.  Review of Systems  Constitutional:  Negative for chills and fever.  Respiratory:  Negative for shortness of breath and wheezing.   Cardiovascular:  Negative for chest pain and leg swelling.  Musculoskeletal:  Negative for back pain and gait problem.  Skin:  Positive for rash. Negative for color change.  All other systems reviewed and are negative.  Per HPI unless specifically indicated above   Allergies as of 10/15/2020       Reactions   Cefaclor Rash        Medication List        Accurate as of October 15, 2020  3:20 PM. If you have any questions, ask your nurse or doctor.          cloNIDine 0.1 MG tablet Commonly known as: CATAPRES Take 0.1 mg by mouth 3 (three) times daily.   escitalopram 20 MG tablet Commonly known as: LEXAPRO Take 20 mg by mouth daily.   finasteride 1 MG tablet Commonly known as: PROPECIA Take 1 mg by mouth daily.   gabapentin 600 MG tablet Commonly known as: NEURONTIN Take 1-1.5 tablets by mouth in the morning, at noon, and at bedtime.   ibuprofen 600 MG tablet Commonly known as: ADVIL Take 1 tablet  (600 mg total) by mouth every 8 (eight) hours as needed.   Latuda 20 MG Tabs tablet Generic drug: lurasidone Take 20 mg by mouth daily.   Lidocaine 3 % Crea Apply 1 application topically 2 (two) times daily as needed. Started by: October 17, 2020, MD   omeprazole 40 MG capsule Commonly known as: PRILOSEC Take 1 capsule (40 mg total) by mouth daily.   permethrin 5 % cream Commonly known as: ELIMITE Apply 1 application topically once for 1 dose. Started by: Nils Pyle Estellar Cadena, MD   propranolol ER 160 MG SR capsule Commonly known as: INDERAL LA Take 160 mg by mouth daily.   QUEtiapine 25 MG tablet Commonly known as: SEROQUEL Take 25 mg by mouth at bedtime.   triamcinolone cream 0.1 % Commonly known as: KENALOG Apply 1 application topically 2 (two) times daily. Started by: [name] Glenn Christo, MD         Objective:   BP 123/90   Pulse 93   Ht 6\' 1"  (1.854 m)   Wt 300 lb (136.1 kg)   SpO2 95%   BMI 39.58 kg/m   Wt Readings from Last 3 Encounters:  10/15/20 300 lb (136.1 kg)  06/24/20 (!) 302 lb (137 kg)  02/05/20 (!) 323 lb (146.5 kg)    Physical Exam Vitals and nursing note reviewed.  Constitutional:      General: He is not in acute distress.    Appearance: He is well-developed. He is not diaphoretic.  Eyes:     General: No scleral icterus.    Conjunctiva/sclera: Conjunctivae normal.  Neck:     Thyroid: No thyromegaly.  Musculoskeletal:     Cervical back: Neck supple.  Lymphadenopathy:     Cervical: No cervical adenopathy.  Skin:    General: Skin is warm and dry.     Findings: Rash (Small pink papules scattered on legs and arms and a few on abdomen.) present.  Neurological:     Mental Status: He is alert and oriented to person, place, and time.     Coordination: Coordination normal.  Psychiatric:        Behavior: Behavior normal.      Assessment & Plan:   Problem List Items Addressed This Visit   None Visit Diagnoses     Scabies    -   Primary   Relevant Medications   Lidocaine 3 % CREA   triamcinolone cream (KENALOG) 0.1 %   permethrin (ELIMITE) 5 % cream     Will treat like scabies and give some creams to help with the itch and the stinging.  Recommended that he get his girlfriend treated that same time and if they wash all clothes and everything in the house.  Follow up plan: Return if symptoms worsen or fail to improve.  Counseling provided for all of the vaccine components No orders of the defined types were placed in this encounter.   Arville Care, MD [facility] 10/15/2020, 3:20 PM

## 2020-10-26 DIAGNOSIS — F1120 Opioid dependence, uncomplicated: Secondary | ICD-10-CM | POA: Diagnosis not present

## 2020-10-26 DIAGNOSIS — F1121 Opioid dependence, in remission: Secondary | ICD-10-CM | POA: Diagnosis not present

## 2020-11-23 DIAGNOSIS — F609 Personality disorder, unspecified: Secondary | ICD-10-CM | POA: Diagnosis not present

## 2020-11-23 DIAGNOSIS — F4310 Post-traumatic stress disorder, unspecified: Secondary | ICD-10-CM | POA: Diagnosis not present

## 2020-11-23 DIAGNOSIS — G4700 Insomnia, unspecified: Secondary | ICD-10-CM | POA: Diagnosis not present

## 2020-11-23 DIAGNOSIS — E559 Vitamin D deficiency, unspecified: Secondary | ICD-10-CM | POA: Diagnosis not present

## 2020-11-23 DIAGNOSIS — F3130 Bipolar disorder, current episode depressed, mild or moderate severity, unspecified: Secondary | ICD-10-CM | POA: Diagnosis not present

## 2020-11-23 DIAGNOSIS — Z79899 Other long term (current) drug therapy: Secondary | ICD-10-CM | POA: Diagnosis not present

## 2020-11-23 DIAGNOSIS — Z131 Encounter for screening for diabetes mellitus: Secondary | ICD-10-CM | POA: Diagnosis not present

## 2020-11-24 DIAGNOSIS — F1121 Opioid dependence, in remission: Secondary | ICD-10-CM | POA: Diagnosis not present

## 2020-11-24 DIAGNOSIS — F1120 Opioid dependence, uncomplicated: Secondary | ICD-10-CM | POA: Diagnosis not present

## 2020-11-26 ENCOUNTER — Ambulatory Visit: Payer: BC Managed Care – PPO | Admitting: Family Medicine

## 2020-11-26 ENCOUNTER — Encounter: Payer: Self-pay | Admitting: Family Medicine

## 2020-11-26 DIAGNOSIS — Z207 Contact with and (suspected) exposure to pediculosis, acariasis and other infestations: Secondary | ICD-10-CM | POA: Diagnosis not present

## 2020-11-26 DIAGNOSIS — L282 Other prurigo: Secondary | ICD-10-CM | POA: Diagnosis not present

## 2020-11-26 DIAGNOSIS — F22 Delusional disorders: Secondary | ICD-10-CM | POA: Diagnosis not present

## 2020-11-26 DIAGNOSIS — F419 Anxiety disorder, unspecified: Secondary | ICD-10-CM | POA: Diagnosis not present

## 2020-11-26 MED ORDER — PERMETHRIN 5 % EX CREA: 1.0000 "application " | TOPICAL_CREAM | Freq: Once | CUTANEOUS | 0 refills | Status: AC

## 2020-11-26 NOTE — Progress Notes (Signed)
Virtual Visit via telephone Note Due to COVID-19 pandemic this visit was conducted virtually. This visit type was conducted due to national recommendations for restrictions regarding the COVID-19 Pandemic (e.g. social distancing, sheltering in place) in an effort to limit this patient's exposure and mitigate transmission in our community. All issues noted in this document were discussed and addressed.  A physical exam was not performed with this format.   I connected with Steve Bass on 11/26/2020 at 1350 by telephone and verified that I am speaking with the correct person using two identifiers. Steve Bass is currently located at home and mother is currently with them during visit. The provider, Kari Baars, FNP is located in their office at time of visit.  I discussed the limitations, risks, security and privacy concerns of performing an evaluation and management service by telephone and the availability of in person appointments. I also discussed with the patient that there may be a patient responsible charge related to this service. The patient expressed understanding and agreed to proceed.  Subjective:  Patient ID: Steve Bass, male    DOB: 07-22-1980, 40 y.o.   MRN: [id number]  Chief Complaint:  Rash and Anxiety   HPI: Steve Bass is a 40 y.o. male presenting on 11/26/2020 for Rash and Anxiety   Patient reports being exposed to scabies while driving his friend's car.  States he has a pruritic rash on his hands and arms and would like something to treat this.  Upon further talking to patient he expresses significant anxiety and paranoia.  States that he has a strong phobia of bugs and germs.  States it is to the point where he has panic attacks and possibly hallucinates thinking about bugs.  He has seen someone in [place] for this before but not recently.  He states his is concerned that his symptoms are worsening.  He does have clonazepam that he takes as needed  for anxiety but feels this is not as beneficial as it used to be.  He denies any suicidal or homicidal ideations.  Denies any self-harm tendencies.  Would like to be referred to psychiatry.  Rash This is a new problem. The current episode started in the past 7 days. The problem has been waxing and waning since onset. The rash is characterized by itchiness and redness. Associated with: scabies. Associated symptoms include fatigue. Pertinent negatives include no fever. Past treatments include nothing. The treatment provided no relief.    Relevant past medical, surgical, family, and social history reviewed and updated as indicated.  Allergies and medications reviewed and updated.   Past Medical History:  Diagnosis Date   ADD (attention deficit disorder with hyperactivity)    Anxiety and depression    GERD (gastroesophageal reflux disease)    LA classification Grade C EE   Obesity    PTSD (post-traumatic stress disorder)    Shingles     Past Surgical History:  Procedure Laterality Date   WISDOM TOOTH EXTRACTION      Social History   Socioeconomic History   Marital status: Single    Spouse name: Not on file   Number of children: 0   Years of education: Not on file   Highest education level: Not on file  Occupational History   Occupation: student  Tobacco Use   Smoking status: Some Days    Packs/day: 0.50    Years: 15.00    Pack years: 7.50    Types: Cigarettes    Last attempt  to quit: 01/07/2013    Years since quitting: 7.8   Smokeless tobacco: Never   Tobacco comments:    tobacco handout given 09/26/16  Vaping Use   Vaping Use: Former  Substance and Sexual Activity   Alcohol use: Not Currently   Drug use: Not Currently    Types: "Crack" cocaine, Marijuana, Oxycodone, Cocaine    Comment: heroine, percocets-patient states he quit 09/24/13   Sexual activity: Yes    Birth control/protection: Condom  Other Topics Concern   Not on file  Social History Narrative   Not on  file   Social Determinants of Health   Financial Resource Strain: Not on file  Food Insecurity: Not on file  Transportation Needs: Not on file  Physical Activity: Not on file  Stress: Not on file  Social Connections: Not on file  Intimate Partner Violence: Not on file    Outpatient Encounter Medications as of 11/26/2020  Medication Sig   permethrin (ELIMITE) 5 % cream Apply 1 application topically once for 1 dose.   cloNIDine (CATAPRES) 0.1 MG tablet Take 0.1 mg by mouth 3 (three) times daily.   escitalopram (LEXAPRO) 20 MG tablet Take 20 mg by mouth daily.   finasteride (PROPECIA) 1 MG tablet Take 1 mg by mouth daily.    gabapentin (NEURONTIN) 600 MG tablet Take 1-1.5 tablets by mouth in the morning, at noon, and at bedtime.   ibuprofen (ADVIL) 600 MG tablet Take 1 tablet (600 mg total) by mouth every 8 (eight) hours as needed.   LATUDA 20 MG TABS tablet Take 20 mg by mouth daily.   Lidocaine 3 % CREA Apply 1 application topically 2 (two) times daily as needed.   omeprazole (PRILOSEC) 40 MG capsule Take 1 capsule (40 mg total) by mouth daily.   propranolol ER (INDERAL LA) 160 MG SR capsule Take 160 mg by mouth daily.   QUEtiapine (SEROQUEL) 25 MG tablet Take 25 mg by mouth at bedtime.   triamcinolone cream (KENALOG) 0.1 % Apply 1 application topically 2 (two) times daily.   No facility-administered encounter medications on file as of 11/26/2020.    Allergies  Allergen Reactions   Cefaclor Rash    Review of Systems  Constitutional:  Positive for activity change and fatigue. Negative for appetite change, chills, diaphoresis, fever and unexpected weight change.  Cardiovascular:  Negative for chest pain, palpitations and leg swelling.  Skin:  Positive for color change and rash.  Psychiatric/Behavioral:  Positive for agitation, decreased concentration, hallucinations and sleep disturbance. Negative for behavioral problems, confusion, dysphoric mood, self-injury and suicidal ideas.  The patient is nervous/anxious. The patient is not hyperactive.   All other systems reviewed and are negative.       Observations/Objective: No vital signs or physical exam, this was a telephone or virtual health encounter.  Pt alert and oriented, answers all questions appropriately, and able to speak in full sentences.    Assessment and Plan: Ishmail was seen today for rash and anxiety.  Diagnoses and all orders for this visit:  Pruritic rash Exposure to scabies Pruritic rash with known exposure to scabies, will treat with Elimite cream.  -     permethrin (ELIMITE) 5 % cream; Apply 1 application topically once for 1 dose.  Anxiety Paranoia (HCC) Significant anxiety and paranoia voiced over the phone.  Denies any homicidal, suicidal, or self-harm ideations.  Will place urgent referral to psychiatry.  Patient aware to follow-up with primary care provider in 2 weeks or sooner if needed. -  Ambulatory referral to Psychiatry     Follow Up Instructions: Return in about 2 weeks (around 12/10/2020) for PCP for anxiety.    I discussed the assessment and treatment plan with the patient. The patient was provided an opportunity to ask questions and all were answered. The patient agreed with the plan and demonstrated an understanding of the instructions.   The patient was advised to call back or seek an in-person evaluation if the symptoms worsen or if the condition fails to improve as anticipated.  The above assessment and management plan was discussed with the patient. The patient verbalized understanding of and has agreed to the management plan. Patient is aware to call the clinic if they develop any new symptoms or if symptoms persist or worsen. Patient is aware when to return to the clinic for a follow-up visit. Patient educated on when it is appropriate to go to the emergency department.    I provided 21 minutes of non-face-to-face time during this encounter. The call started at  1350. The call ended at 1410. The other time was used for coordination of care.    Kari Baars, FNP-C [facility] [address] [place], [place] [address] [phone] 11/26/2020

## 2020-12-06 DIAGNOSIS — F1121 Opioid dependence, in remission: Secondary | ICD-10-CM | POA: Diagnosis not present

## 2020-12-21 DIAGNOSIS — F1121 Opioid dependence, in remission: Secondary | ICD-10-CM | POA: Diagnosis not present

## 2020-12-21 DIAGNOSIS — F1120 Opioid dependence, uncomplicated: Secondary | ICD-10-CM | POA: Diagnosis not present

## 2020-12-23 DIAGNOSIS — F3130 Bipolar disorder, current episode depressed, mild or moderate severity, unspecified: Secondary | ICD-10-CM | POA: Diagnosis not present

## 2020-12-23 DIAGNOSIS — F609 Personality disorder, unspecified: Secondary | ICD-10-CM | POA: Diagnosis not present

## 2020-12-23 DIAGNOSIS — G4700 Insomnia, unspecified: Secondary | ICD-10-CM | POA: Diagnosis not present

## 2020-12-23 DIAGNOSIS — F4310 Post-traumatic stress disorder, unspecified: Secondary | ICD-10-CM | POA: Diagnosis not present

## 2021-01-03 DIAGNOSIS — F1121 Opioid dependence, in remission: Secondary | ICD-10-CM | POA: Diagnosis not present

## 2021-01-13 DIAGNOSIS — F1120 Opioid dependence, uncomplicated: Secondary | ICD-10-CM | POA: Diagnosis not present

## 2021-01-13 DIAGNOSIS — F1121 Opioid dependence, in remission: Secondary | ICD-10-CM | POA: Diagnosis not present

## 2021-01-20 DIAGNOSIS — F17210 Nicotine dependence, cigarettes, uncomplicated: Secondary | ICD-10-CM | POA: Diagnosis not present

## 2021-01-20 DIAGNOSIS — F17200 Nicotine dependence, unspecified, uncomplicated: Secondary | ICD-10-CM | POA: Diagnosis not present

## 2021-01-20 DIAGNOSIS — G4700 Insomnia, unspecified: Secondary | ICD-10-CM | POA: Diagnosis not present

## 2021-01-20 DIAGNOSIS — F3130 Bipolar disorder, current episode depressed, mild or moderate severity, unspecified: Secondary | ICD-10-CM | POA: Diagnosis not present

## 2021-01-20 DIAGNOSIS — F4310 Post-traumatic stress disorder, unspecified: Secondary | ICD-10-CM | POA: Diagnosis not present

## 2021-01-20 DIAGNOSIS — F609 Personality disorder, unspecified: Secondary | ICD-10-CM | POA: Diagnosis not present

## 2021-02-02 DIAGNOSIS — F1121 Opioid dependence, in remission: Secondary | ICD-10-CM | POA: Diagnosis not present

## 2021-02-08 DIAGNOSIS — F1120 Opioid dependence, uncomplicated: Secondary | ICD-10-CM | POA: Diagnosis not present

## 2021-02-08 DIAGNOSIS — F1121 Opioid dependence, in remission: Secondary | ICD-10-CM | POA: Diagnosis not present

## 2021-03-16 DIAGNOSIS — F1121 Opioid dependence, in remission: Secondary | ICD-10-CM | POA: Diagnosis not present

## 2021-03-22 DIAGNOSIS — F1121 Opioid dependence, in remission: Secondary | ICD-10-CM | POA: Diagnosis not present

## 2021-03-22 DIAGNOSIS — F1120 Opioid dependence, uncomplicated: Secondary | ICD-10-CM | POA: Diagnosis not present

## 2021-03-23 DIAGNOSIS — G4700 Insomnia, unspecified: Secondary | ICD-10-CM | POA: Diagnosis not present

## 2021-03-23 DIAGNOSIS — F3130 Bipolar disorder, current episode depressed, mild or moderate severity, unspecified: Secondary | ICD-10-CM | POA: Diagnosis not present

## 2021-03-23 DIAGNOSIS — F609 Personality disorder, unspecified: Secondary | ICD-10-CM | POA: Diagnosis not present

## 2021-03-23 DIAGNOSIS — F17210 Nicotine dependence, cigarettes, uncomplicated: Secondary | ICD-10-CM | POA: Diagnosis not present

## 2021-03-23 DIAGNOSIS — F4310 Post-traumatic stress disorder, unspecified: Secondary | ICD-10-CM | POA: Diagnosis not present

## 2021-03-23 DIAGNOSIS — Z6837 Body mass index (BMI) 37.0-37.9, adult: Secondary | ICD-10-CM | POA: Diagnosis not present

## 2021-03-23 DIAGNOSIS — F17200 Nicotine dependence, unspecified, uncomplicated: Secondary | ICD-10-CM | POA: Diagnosis not present

## 2021-04-14 DIAGNOSIS — F1120 Opioid dependence, uncomplicated: Secondary | ICD-10-CM | POA: Diagnosis not present

## 2021-04-14 DIAGNOSIS — F1121 Opioid dependence, in remission: Secondary | ICD-10-CM | POA: Diagnosis not present

## 2021-04-19 DIAGNOSIS — F1120 Opioid dependence, uncomplicated: Secondary | ICD-10-CM | POA: Diagnosis not present

## 2021-04-19 DIAGNOSIS — F1121 Opioid dependence, in remission: Secondary | ICD-10-CM | POA: Diagnosis not present

## 2021-05-11 DIAGNOSIS — F1121 Opioid dependence, in remission: Secondary | ICD-10-CM | POA: Diagnosis not present

## 2021-05-16 DIAGNOSIS — F1120 Opioid dependence, uncomplicated: Secondary | ICD-10-CM | POA: Diagnosis not present

## 2021-05-16 DIAGNOSIS — F1121 Opioid dependence, in remission: Secondary | ICD-10-CM | POA: Diagnosis not present

## 2021-05-17 DIAGNOSIS — F3189 Other bipolar disorder: Secondary | ICD-10-CM | POA: Diagnosis not present

## 2021-05-17 DIAGNOSIS — F900 Attention-deficit hyperactivity disorder, predominantly inattentive type: Secondary | ICD-10-CM | POA: Diagnosis not present

## 2021-05-17 DIAGNOSIS — F4010 Social phobia, unspecified: Secondary | ICD-10-CM | POA: Diagnosis not present

## 2021-06-09 DIAGNOSIS — F1121 Opioid dependence, in remission: Secondary | ICD-10-CM | POA: Diagnosis not present

## 2021-06-09 DIAGNOSIS — F1120 Opioid dependence, uncomplicated: Secondary | ICD-10-CM | POA: Diagnosis not present

## 2021-06-14 DIAGNOSIS — F1121 Opioid dependence, in remission: Secondary | ICD-10-CM | POA: Diagnosis not present

## 2021-06-15 DIAGNOSIS — F1121 Opioid dependence, in remission: Secondary | ICD-10-CM | POA: Diagnosis not present

## 2021-06-15 DIAGNOSIS — F1120 Opioid dependence, uncomplicated: Secondary | ICD-10-CM | POA: Diagnosis not present

## 2021-07-11 DIAGNOSIS — F1121 Opioid dependence, in remission: Secondary | ICD-10-CM | POA: Diagnosis not present

## 2021-07-11 DIAGNOSIS — F1120 Opioid dependence, uncomplicated: Secondary | ICD-10-CM | POA: Diagnosis not present

## 2021-08-04 DIAGNOSIS — F1121 Opioid dependence, in remission: Secondary | ICD-10-CM | POA: Diagnosis not present

## 2021-08-04 DIAGNOSIS — F1120 Opioid dependence, uncomplicated: Secondary | ICD-10-CM | POA: Diagnosis not present

## 2021-08-08 DIAGNOSIS — F1121 Opioid dependence, in remission: Secondary | ICD-10-CM | POA: Diagnosis not present

## 2021-08-25 ENCOUNTER — Ambulatory Visit: Payer: BC Managed Care – PPO | Admitting: Family Medicine

## 2021-08-31 ENCOUNTER — Telehealth: Payer: Self-pay

## 2021-08-31 NOTE — Chronic Care Management (AMB) (Signed)
  Care Management   Outreach Note  08/31/2021 Name: Steve Bass MRN: [id number] DOB: August 23, 1980  An unsuccessful telephone outreach was attempted today. The patient was referred to the case management team for assistance with care management and care coordination.   Follow Up Plan:  The care management team will reach out to the patient again over the next 7 days.  If patient returns call to provider office, please advise to call Care Management Care Guide Penne Lash * at [phone]*  Penne Lash, RMA Care Guide [facility]  [place], [place] [address] Direct Dial: [phone] [email or web address]

## 2021-09-05 DIAGNOSIS — F1121 Opioid dependence, in remission: Secondary | ICD-10-CM | POA: Diagnosis not present

## 2021-09-07 NOTE — Chronic Care Management (AMB) (Signed)
  Care Coordination  Note  09/07/2021 Name: Steve Bass MRN: [id number] DOB: 10-16-1980  Steve Bass is a 41 y.o. year old male who is a primary care patient of Dettinger, [name], MD. I reached out to Roxanna Mew by phone today to offer care coordination services.      [name] was given information about Care Coordination services today including:  The Care Coordination services include support from the care team which includes your Nurse Coordinator, Clinical Social Worker, or Pharmacist.  The Care Coordination team is here to help remove barriers to the health concerns and goals most important to you. Care Coordination services are voluntary and the patient may decline or stop services at any time by request to their care team member.   Patient did not agree to participate in care coordination services at this time.  Follow up plan: Patient declines further follow up or participation in care coordination services.   Penne Lash, RMA Care Guide [facility] [place], [place] [address] Direct Dial: [phone] [email or web address]

## 2021-09-13 DIAGNOSIS — F1121 Opioid dependence, in remission: Secondary | ICD-10-CM | POA: Diagnosis not present

## 2021-09-13 DIAGNOSIS — F1120 Opioid dependence, uncomplicated: Secondary | ICD-10-CM | POA: Diagnosis not present

## 2021-09-15 ENCOUNTER — Encounter: Payer: Self-pay | Admitting: Family Medicine

## 2021-09-15 ENCOUNTER — Ambulatory Visit (INDEPENDENT_AMBULATORY_CARE_PROVIDER_SITE_OTHER): Payer: BC Managed Care – PPO | Admitting: Family Medicine

## 2021-09-15 VITALS — BP 98/67 | HR 80 | Temp 98.3°F | Ht 73.0 in | Wt 278.0 lb

## 2021-09-15 DIAGNOSIS — L659 Nonscarring hair loss, unspecified: Secondary | ICD-10-CM | POA: Diagnosis not present

## 2021-09-15 DIAGNOSIS — Z13220 Encounter for screening for lipoid disorders: Secondary | ICD-10-CM

## 2021-09-15 DIAGNOSIS — Z131 Encounter for screening for diabetes mellitus: Secondary | ICD-10-CM

## 2021-09-15 MED ORDER — FINASTERIDE 1 MG PO TABS: 1.0000 mg | ORAL_TABLET | Freq: Every day | ORAL | 11 refills | Status: AC

## 2021-09-15 NOTE — Progress Notes (Signed)
BP 98/67   Pulse 80   Temp 98.3 F (36.8 C)   Ht 6' 1"  (1.854 m)   Wt 278 lb (126.1 kg)   SpO2 94%   BMI 36.68 kg/m    Subjective:   Patient ID: Steve Bass, male    DOB: [birth year], 41 y.o.   MRN: [id number]  HPI: Steve Bass is a 41 y.o. male presenting on 09/15/2021 for Medical Management of Chronic Issues and Anxiety   HPI Patient is coming in today to discuss male pattern baldness.  He has done Propecia before but now is getting more thinning and more balding especially on both sides of the top of his head.  Relevant past medical, surgical, family and social history reviewed and updated as indicated. Interim medical history since our last visit reviewed. Allergies and medications reviewed and updated.  Review of Systems  Constitutional:  Negative for chills and fever.  Respiratory:  Negative for shortness of breath and wheezing.   Cardiovascular:  Negative for chest pain and leg swelling.  Musculoskeletal:  Negative for back pain and gait problem.  Skin:  Negative for rash.  All other systems reviewed and are negative.   Per HPI unless specifically indicated above   Allergies as of 09/15/2021       Reactions   Cefaclor Rash        Medication List        Accurate as of September 15, 2021  1:27 PM. If you have any questions, ask your nurse or doctor.          STOP taking these medications    Lidocaine 3 % Crea Stopped by: Fransisca Kaufmann Asaiah Hunnicutt, MD   triamcinolone cream 0.1 % Commonly known as: KENALOG Stopped by: Fransisca Kaufmann Shequilla Goodgame, MD       TAKE these medications    cloNIDine 0.1 MG tablet Commonly known as: CATAPRES Take 0.1 mg by mouth 3 (three) times daily.   escitalopram 20 MG tablet Commonly known as: LEXAPRO Take 20 mg by mouth daily.   finasteride 1 MG tablet Commonly known as: PROPECIA Take 1 tablet (1 mg total) by mouth daily.   gabapentin 600 MG tablet Commonly known as: NEURONTIN Take 1-1.5 tablets by mouth in the  morning, at noon, and at bedtime.   ibuprofen 600 MG tablet Commonly known as: ADVIL Take 1 tablet (600 mg total) by mouth every 8 (eight) hours as needed.   Latuda 20 MG Tabs tablet Generic drug: lurasidone Take 20 mg by mouth daily.   omeprazole 40 MG capsule Commonly known as: PRILOSEC Take 1 capsule (40 mg total) by mouth daily.   propranolol ER 160 MG SR capsule Commonly known as: INDERAL LA Take 160 mg by mouth daily.   QUEtiapine 25 MG tablet Commonly known as: SEROQUEL Take 25 mg by mouth at bedtime.         Objective:   BP 98/67   Pulse 80   Temp 98.3 F (36.8 C)   Ht 6' 1"  (1.854 m)   Wt 278 lb (126.1 kg)   SpO2 94%   BMI 36.68 kg/m   Wt Readings from Last 3 Encounters:  09/15/21 278 lb (126.1 kg)  10/15/20 300 lb (136.1 kg)  06/24/20 (!) 302 lb (137 kg)    Physical Exam Vitals and nursing note reviewed.  Constitutional:      Appearance: Normal appearance.  HENT:     Head:     Comments: Male pattern balding and thinning  on upper scalp on both sides of his forehead Neurological:     Mental Status: He is alert.       Assessment & Plan:   Problem List Items Addressed This Visit   None Visit Diagnoses     Hair loss    -  Primary   Relevant Medications   finasteride (PROPECIA) 1 MG tablet   Other Relevant Orders   TSH   Lipid screening       Relevant Orders   Lipid panel   Diabetes mellitus screening       Relevant Orders   CBC with Differential/Platelet   CMP14+EGFR       Restart Propecia, will do some blood work Follow up plan: Return in about 1 year (around 09/16/2022), or if symptoms worsen or fail to improve, for Physical exam.  Counseling provided for all of the vaccine components Orders Placed This Encounter  Procedures   CBC with Differential/Platelet   CMP14+EGFR   Lipid panel   TSH    Caryl Pina, MD [facility] 09/15/2021, 1:27 PM

## 2021-10-10 DIAGNOSIS — F1121 Opioid dependence, in remission: Secondary | ICD-10-CM | POA: Diagnosis not present

## 2021-10-18 DIAGNOSIS — F1120 Opioid dependence, uncomplicated: Secondary | ICD-10-CM | POA: Diagnosis not present

## 2021-10-18 DIAGNOSIS — F1121 Opioid dependence, in remission: Secondary | ICD-10-CM | POA: Diagnosis not present

## 2021-11-08 DIAGNOSIS — F900 Attention-deficit hyperactivity disorder, predominantly inattentive type: Secondary | ICD-10-CM | POA: Diagnosis not present

## 2021-11-08 DIAGNOSIS — F4010 Social phobia, unspecified: Secondary | ICD-10-CM | POA: Diagnosis not present

## 2021-11-28 DIAGNOSIS — F1120 Opioid dependence, uncomplicated: Secondary | ICD-10-CM | POA: Diagnosis not present

## 2021-11-28 DIAGNOSIS — F1121 Opioid dependence, in remission: Secondary | ICD-10-CM | POA: Diagnosis not present

## 2022-01-02 DIAGNOSIS — F1121 Opioid dependence, in remission: Secondary | ICD-10-CM | POA: Diagnosis not present

## 2022-01-04 DIAGNOSIS — F1120 Opioid dependence, uncomplicated: Secondary | ICD-10-CM | POA: Diagnosis not present

## 2022-01-04 DIAGNOSIS — F1121 Opioid dependence, in remission: Secondary | ICD-10-CM | POA: Diagnosis not present

## 2022-02-13 DIAGNOSIS — F1120 Opioid dependence, uncomplicated: Secondary | ICD-10-CM | POA: Diagnosis not present

## 2022-02-20 DIAGNOSIS — F1121 Opioid dependence, in remission: Secondary | ICD-10-CM | POA: Diagnosis not present

## 2022-02-20 DIAGNOSIS — F1120 Opioid dependence, uncomplicated: Secondary | ICD-10-CM | POA: Diagnosis not present

## 2022-03-13 DIAGNOSIS — F1120 Opioid dependence, uncomplicated: Secondary | ICD-10-CM | POA: Diagnosis not present

## 2022-04-03 DIAGNOSIS — F1120 Opioid dependence, uncomplicated: Secondary | ICD-10-CM | POA: Diagnosis not present

## 2022-04-03 DIAGNOSIS — F1121 Opioid dependence, in remission: Secondary | ICD-10-CM | POA: Diagnosis not present

## 2022-04-06 DIAGNOSIS — F1120 Opioid dependence, uncomplicated: Secondary | ICD-10-CM | POA: Diagnosis not present

## 2022-04-06 DIAGNOSIS — F1121 Opioid dependence, in remission: Secondary | ICD-10-CM | POA: Diagnosis not present

## 2022-05-03 DIAGNOSIS — F1121 Opioid dependence, in remission: Secondary | ICD-10-CM | POA: Diagnosis not present

## 2022-05-08 DIAGNOSIS — F1121 Opioid dependence, in remission: Secondary | ICD-10-CM | POA: Diagnosis not present

## 2022-05-08 DIAGNOSIS — F1120 Opioid dependence, uncomplicated: Secondary | ICD-10-CM | POA: Diagnosis not present

## 2022-06-01 DIAGNOSIS — F4010 Social phobia, unspecified: Secondary | ICD-10-CM | POA: Diagnosis not present

## 2022-06-01 DIAGNOSIS — F5101 Primary insomnia: Secondary | ICD-10-CM | POA: Diagnosis not present

## 2022-06-01 DIAGNOSIS — F900 Attention-deficit hyperactivity disorder, predominantly inattentive type: Secondary | ICD-10-CM | POA: Diagnosis not present

## 2022-06-14 DIAGNOSIS — F1121 Opioid dependence, in remission: Secondary | ICD-10-CM | POA: Diagnosis not present

## 2022-06-19 DIAGNOSIS — F1120 Opioid dependence, uncomplicated: Secondary | ICD-10-CM | POA: Diagnosis not present

## 2022-06-19 DIAGNOSIS — F1121 Opioid dependence, in remission: Secondary | ICD-10-CM | POA: Diagnosis not present

## 2022-08-03 ENCOUNTER — Encounter: Payer: Self-pay | Admitting: Family

## 2022-08-03 ENCOUNTER — Telehealth: Payer: BC Managed Care – PPO | Admitting: Family

## 2022-08-03 DIAGNOSIS — U071 COVID-19: Secondary | ICD-10-CM | POA: Diagnosis not present

## 2022-08-03 MED ORDER — MOLNUPIRAVIR EUA 200MG CAPSULE: 4.0000 | ORAL_CAPSULE | Freq: Two times a day (BID) | ORAL | 0 refills | Status: AC

## 2022-08-03 MED ORDER — ALBUTEROL SULFATE HFA 108 (90 BASE) MCG/ACT IN AERS: 2.0000 | INHALATION_SPRAY | Freq: Four times a day (QID) | RESPIRATORY_TRACT | 0 refills | Status: DC | PRN

## 2022-08-03 MED ORDER — BENZONATATE 200 MG PO CAPS: 200.0000 mg | ORAL_CAPSULE | Freq: Three times a day (TID) | ORAL | 1 refills | Status: DC | PRN

## 2022-08-03 NOTE — Progress Notes (Signed)
Virtual Visit Consent   Steve Bass, you are scheduled for a virtual visit with a [facility] provider today. Just as with appointments in the office, your consent must be obtained to participate. Your consent will be active for this visit and any virtual visit you may have with one of our providers in the next 365 days. If you have a MyChart account, a copy of this consent can be sent to you electronically.  As this is a virtual visit, video technology does not allow for your provider to perform a traditional examination. This may limit your provider's ability to fully assess your condition. If your provider identifies any concerns that need to be evaluated in person or the need to arrange testing (such as labs, EKG, etc.), we will make arrangements to do so. Although advances in technology are sophisticated, we cannot ensure that it will always work on either your end or our end. If the connection with a video visit is poor, the visit may have to be switched to a telephone visit. With either a video or telephone visit, we are not always able to ensure that we have a secure connection.  By engaging in this virtual visit, you consent to the provision of healthcare and authorize for your insurance to be billed (if applicable) for the services provided during this visit. Depending on your insurance coverage, you may receive a charge related to this service.  I need to obtain your verbal consent now. Are you willing to proceed with your visit today? Steve Bass has provided verbal consent on 08/03/2022 for a virtual visit (video or telephone). Jannifer Rodney, FNP  Date: 08/03/2022 3:48 PM  Virtual Visit via Video Note   I, Jannifer Rodney, connected with  Steve Bass  ([id number], [birth year]) on 08/03/22 at  5:00 PM EDT by a video-enabled telemedicine application and verified that I am speaking with the correct person using two identifiers.  Location: Patient: Virtual Visit Location  Patient: Home Provider: Virtual Visit Location Provider: Office/Clinic   I discussed the limitations of evaluation and management by telemedicine and the availability of in person appointments. The patient expressed understanding and agreed to proceed.    History of Present Illness: Steve Bass is a 42 y.o. who identifies as a male who was assigned male at birth, and is being seen today for COVID. He reports his symptoms started two days ago and tested positive today.   HPI: Cough This is a new problem. The current episode started in the past 7 days. The problem has been unchanged. The problem occurs every few minutes. The cough is Productive of sputum and productive of purulent sputum. Associated symptoms include chills, ear congestion, ear pain, headaches, myalgias, nasal congestion, postnasal drip, rhinorrhea, shortness of breath and wheezing. Pertinent negatives include no fever or sore throat. Risk factors for lung disease include smoking/tobacco exposure. He has tried rest and OTC cough suppressant for the symptoms. The treatment provided mild relief.    Problems:  Patient Active Problem List   Diagnosis Date Noted   Restless legs syndrome 03/21/2018   Delusional disorder, somatic type, multiple episodes, currently in acute episode (HCC) 10/17/2017   Binge-eating disorder, moderate 10/17/2017   Hypogonadism in male 05/28/2013   Opioid use disorder, severe, on maintenance therapy, dependence (HCC) 11/28/2012   Smoker 10/20/2012   Polysubstance dependence (HCC) 09/10/2012   Other stimulant dependence with stimulant-induced mood disorder (HCC) 09/10/2012   Generalized anxiety disorder 09/10/2012   PTSD (post-traumatic stress  disorder) 09/10/2012   GERD 01/21/2008   Attention deficit hyperactivity disorder (ADHD), combined type, severe 11/21/2007    Allergies:  Allergies  Allergen Reactions   Cefaclor Rash   Medications:  Current Outpatient Medications:    albuterol  (VENTOLIN HFA) 108 (90 Base) MCG/ACT inhaler, Inhale 2 puffs into the lungs every 6 (six) hours as needed for wheezing or shortness of breath., Disp: 8 g, Rfl: 0   benzonatate (TESSALON) 200 MG capsule, Take 1 capsule (200 mg total) by mouth 3 (three) times daily as needed., Disp: 30 capsule, Rfl: 1   molnupiravir EUA (LAGEVRIO) 200 mg CAPS capsule, Take 4 capsules (800 mg total) by mouth 2 (two) times daily for 5 days., Disp: 40 capsule, Rfl: 0   cloNIDine (CATAPRES) 0.1 MG tablet, Take 0.1 mg by mouth 3 (three) times daily., Disp: , Rfl:    escitalopram (LEXAPRO) 20 MG tablet, Take 20 mg by mouth daily., Disp: , Rfl:    finasteride (PROPECIA) 1 MG tablet, Take 1 tablet (1 mg total) by mouth daily., Disp: 30 tablet, Rfl: 11   gabapentin (NEURONTIN) 600 MG tablet, Take 1-1.5 tablets by mouth in the morning, at noon, and at bedtime., Disp: , Rfl:    ibuprofen (ADVIL) 600 MG tablet, Take 1 tablet (600 mg total) by mouth every 8 (eight) hours as needed., Disp: 30 tablet, Rfl: 0   LATUDA 20 MG TABS tablet, Take 20 mg by mouth daily., Disp: , Rfl:    omeprazole (PRILOSEC) 40 MG capsule, Take 1 capsule (40 mg total) by mouth daily., Disp: 30 capsule, Rfl: 11   propranolol ER (INDERAL LA) 160 MG SR capsule, Take 160 mg by mouth daily., Disp: , Rfl:    QUEtiapine (SEROQUEL) 25 MG tablet, Take 25 mg by mouth at bedtime., Disp: , Rfl:   Observations/Objective: Patient is well-developed, well-nourished in no acute distress.  Resting comfortably  at home.  Head is normocephalic, atraumatic.  No labored breathing.  Speech is clear and coherent with logical content.  Patient is alert and oriented at baseline.  Nasal congestion  Assessment and Plan: 1. COVID-19 - molnupiravir EUA (LAGEVRIO) 200 mg CAPS capsule; Take 4 capsules (800 mg total) by mouth 2 (two) times daily for 5 days.  Dispense: 40 capsule; Refill: 0 - albuterol (VENTOLIN HFA) 108 (90 Base) MCG/ACT inhaler; Inhale 2 puffs into the lungs every  6 (six) hours as needed for wheezing or shortness of breath.  Dispense: 8 g; Refill: 0 - benzonatate (TESSALON) 200 MG capsule; Take 1 capsule (200 mg total) by mouth 3 (three) times daily as needed.  Dispense: 30 capsule; Refill: 1  COVID positive, rest, force fluids, tylenol as needed, report any worsening symptoms such as increased shortness of breath, swelling, or continued high fevers. Possible adverse effects discussed with antivirals.    Follow Up Instructions: I discussed the assessment and treatment plan with the patient. The patient was provided an opportunity to ask questions and all were answered. The patient agreed with the plan and demonstrated an understanding of the instructions.  A copy of instructions were sent to the patient via MyChart unless otherwise noted below.     The patient was advised to call back or seek an in-person evaluation if the symptoms worsen or if the condition fails to improve as anticipated.  Time:  I spent 11 minutes with the patient via telehealth technology discussing the above problems/concerns.    Jannifer Rodney, FNP

## 2022-08-21 DIAGNOSIS — F1121 Opioid dependence, in remission: Secondary | ICD-10-CM | POA: Diagnosis not present

## 2022-08-25 ENCOUNTER — Other Ambulatory Visit: Payer: Self-pay | Admitting: Family

## 2022-08-25 DIAGNOSIS — U071 COVID-19: Secondary | ICD-10-CM

## 2022-08-31 DIAGNOSIS — F1120 Opioid dependence, uncomplicated: Secondary | ICD-10-CM | POA: Diagnosis not present

## 2022-08-31 DIAGNOSIS — F1121 Opioid dependence, in remission: Secondary | ICD-10-CM | POA: Diagnosis not present

## 2022-10-11 DIAGNOSIS — F1121 Opioid dependence, in remission: Secondary | ICD-10-CM | POA: Diagnosis not present

## 2022-10-17 DIAGNOSIS — F1121 Opioid dependence, in remission: Secondary | ICD-10-CM | POA: Diagnosis not present

## 2022-10-17 DIAGNOSIS — F1120 Opioid dependence, uncomplicated: Secondary | ICD-10-CM | POA: Diagnosis not present

## 2022-11-22 DIAGNOSIS — F410 Panic disorder [episodic paroxysmal anxiety] without agoraphobia: Secondary | ICD-10-CM | POA: Diagnosis not present

## 2022-11-22 DIAGNOSIS — F4310 Post-traumatic stress disorder, unspecified: Secondary | ICD-10-CM | POA: Diagnosis not present

## 2022-11-22 DIAGNOSIS — F4010 Social phobia, unspecified: Secondary | ICD-10-CM | POA: Diagnosis not present

## 2022-11-22 DIAGNOSIS — F900 Attention-deficit hyperactivity disorder, predominantly inattentive type: Secondary | ICD-10-CM | POA: Diagnosis not present

## 2022-12-06 DIAGNOSIS — F1121 Opioid dependence, in remission: Secondary | ICD-10-CM | POA: Diagnosis not present

## 2022-12-11 DIAGNOSIS — F1120 Opioid dependence, uncomplicated: Secondary | ICD-10-CM | POA: Diagnosis not present

## 2022-12-11 DIAGNOSIS — F1121 Opioid dependence, in remission: Secondary | ICD-10-CM | POA: Diagnosis not present

## 2023-02-07 DIAGNOSIS — F1121 Opioid dependence, in remission: Secondary | ICD-10-CM | POA: Diagnosis not present

## 2023-02-28 DIAGNOSIS — F1120 Opioid dependence, uncomplicated: Secondary | ICD-10-CM | POA: Diagnosis not present

## 2023-02-28 DIAGNOSIS — F1121 Opioid dependence, in remission: Secondary | ICD-10-CM | POA: Diagnosis not present

## 2023-04-23 DIAGNOSIS — F1121 Opioid dependence, in remission: Secondary | ICD-10-CM | POA: Diagnosis not present

## 2023-04-30 DIAGNOSIS — F1121 Opioid dependence, in remission: Secondary | ICD-10-CM | POA: Diagnosis not present

## 2023-04-30 DIAGNOSIS — F1120 Opioid dependence, uncomplicated: Secondary | ICD-10-CM | POA: Diagnosis not present

## 2023-05-17 DIAGNOSIS — F1121 Opioid dependence, in remission: Secondary | ICD-10-CM | POA: Diagnosis not present

## 2023-05-17 DIAGNOSIS — F3189 Other bipolar disorder: Secondary | ICD-10-CM | POA: Diagnosis not present

## 2023-05-17 DIAGNOSIS — F4010 Social phobia, unspecified: Secondary | ICD-10-CM | POA: Diagnosis not present

## 2023-05-17 DIAGNOSIS — F900 Attention-deficit hyperactivity disorder, predominantly inattentive type: Secondary | ICD-10-CM | POA: Diagnosis not present

## 2023-06-18 DIAGNOSIS — F1121 Opioid dependence, in remission: Secondary | ICD-10-CM | POA: Diagnosis not present

## 2023-06-28 DIAGNOSIS — F1121 Opioid dependence, in remission: Secondary | ICD-10-CM | POA: Diagnosis not present

## 2023-06-28 DIAGNOSIS — F1120 Opioid dependence, uncomplicated: Secondary | ICD-10-CM | POA: Diagnosis not present

## 2023-08-23 DIAGNOSIS — F1121 Opioid dependence, in remission: Secondary | ICD-10-CM | POA: Diagnosis not present

## 2023-09-20 DIAGNOSIS — F1120 Opioid dependence, uncomplicated: Secondary | ICD-10-CM | POA: Diagnosis not present

## 2023-09-20 DIAGNOSIS — F1290 Cannabis use, unspecified, uncomplicated: Secondary | ICD-10-CM | POA: Diagnosis not present

## 2023-09-20 DIAGNOSIS — F1190 Opioid use, unspecified, uncomplicated: Secondary | ICD-10-CM | POA: Diagnosis not present

## 2023-09-20 DIAGNOSIS — F411 Generalized anxiety disorder: Secondary | ICD-10-CM | POA: Diagnosis not present

## 2023-09-20 DIAGNOSIS — F32A Depression, unspecified: Secondary | ICD-10-CM | POA: Diagnosis not present

## 2023-12-10 DIAGNOSIS — F3189 Other bipolar disorder: Secondary | ICD-10-CM | POA: Diagnosis not present

## 2023-12-10 DIAGNOSIS — F4010 Social phobia, unspecified: Secondary | ICD-10-CM | POA: Diagnosis not present

## 2023-12-10 DIAGNOSIS — F1121 Opioid dependence, in remission: Secondary | ICD-10-CM | POA: Diagnosis not present

## 2023-12-10 DIAGNOSIS — F900 Attention-deficit hyperactivity disorder, predominantly inattentive type: Secondary | ICD-10-CM | POA: Diagnosis not present

## 2024-01-15 ENCOUNTER — Telehealth: Admitting: Family Medicine

## 2024-01-15 ENCOUNTER — Ambulatory Visit: Payer: Self-pay

## 2024-01-15 DIAGNOSIS — J111 Influenza due to unidentified influenza virus with other respiratory manifestations: Secondary | ICD-10-CM

## 2024-01-15 MED ORDER — PROMETHAZINE-DM 6.25-15 MG/5ML PO SYRP: 5.0000 mL | ORAL_SOLUTION | Freq: Four times a day (QID) | ORAL | 0 refills | Status: AC | PRN

## 2024-01-15 MED ORDER — OSELTAMIVIR PHOSPHATE 75 MG PO CAPS: 75.0000 mg | ORAL_CAPSULE | Freq: Two times a day (BID) | ORAL | 0 refills | Status: AC

## 2024-01-15 NOTE — Telephone Encounter (Signed)
 FYI Only or Action Required?: FYI only for provider: appointment scheduled on 01/15/24.  Patient was last seen in primary care on 08/03/2022 by Lavell Bari LABOR, FNP.  Called Nurse Triage reporting Influenza.  Symptoms began today.  Interventions attempted: OTC medications: dayquil and Rest, hydration, or home remedies.  Symptoms are: unchanged.  Triage Disposition: Call PCP Within 24 Hours  Patient/caregiver understands and will follow disposition?: Yes   Copied from CRM #[id number]. Topic: Clinical - Red Word Triage >> Jan 15, 2024  4:57 PM Wess RAMAN wrote: Red Word that prompted transfer to Nurse Triage: Patient's mother, [place], stated patient has fever over 100, congestion, body aches, chills and coughing     Reason for Disposition  [1] Patient is NOT HIGH RISK AND [2] strongly requests antiviral medicine AND [3] flu symptoms present < 48 hours  Answer Assessment - Initial Assessment Questions 1. SYMPTOMS: What is your main symptom or concern? (e.g., cough, fever, shortness of breath, muscle aches)     Fever; congestion; direct exposure to Flu B  2. ONSET: When did the symptoms start?      Today at 4am   3. COUGH: Do you have a cough? If Yes, ask: How bad is the cough?       Yes; productive cough   4. FEVER: Do you have a fever? If Yes, ask: What is your temperature, how was it measured, and when did it start?     Yes; 100F   5. BREATHING DIFFICULTY: Are you having any difficulty breathing? (e.g., normal; shortness of breath, wheezing, unable to speak)      No   6. BETTER-SAME-WORSE: Are you getting better, staying the same or getting worse compared to yesterday?  If getting worse, ask, In what way?     Staying the same   7. OTHER SYMPTOMS: Do you have any other symptoms?  (e.g., chills, fatigue, headache, loss of smell or taste, muscle pain, sore throat)     Fever, congestion, body aches, chills, coughing   8. INFLUENZA EXPOSURE: Was there any  known exposure to influenza (flu) before the symptoms began?      Yes; girlfriend tested positive for Flu B  9. INFLUENZA SUSPECTED: Why do you think you have influenza? (e.g., positive flu self-test at home, symptoms after exposure).     Yes  Protocols used: Influenza (Flu) Suspected-A-AH

## 2024-01-15 NOTE — Progress Notes (Signed)
 " Virtual Visit Consent   Steve Bass, you are scheduled for a virtual visit with a [facility] provider today. Just as with appointments in the office, your consent must be obtained to participate. Your consent will be active for this visit and any virtual visit you may have with one of our providers in the next 365 days. If you have a MyChart account, a copy of this consent can be sent to you electronically.  As this is a virtual visit, video technology does not allow for your provider to perform a traditional examination. This may limit your provider's ability to fully assess your condition. If your provider identifies any concerns that need to be evaluated in person or the need to arrange testing (such as labs, EKG, etc.), we will make arrangements to do so. Although advances in technology are sophisticated, we cannot ensure that it will always work on either your end or our end. If the connection with a video visit is poor, the visit may have to be switched to a telephone visit. With either a video or telephone visit, we are not always able to ensure that we have a secure connection.  By engaging in this virtual visit, you consent to the provision of healthcare and authorize for your insurance to be billed (if applicable) for the services provided during this visit. Depending on your insurance coverage, you may receive a charge related to this service.  I need to obtain your verbal consent now. Are you willing to proceed with your visit today? Steve Bass has provided verbal consent on 01/15/2024 for a virtual visit (video or telephone). Steve Lamp, FNP  Date: 01/15/2024 5:41 PM   Virtual Visit via Video Note   I, Steve Bass, connected with  Steve Bass  ([id number], Oct 02, 1980) on 01/15/2024 at  5:30 PM EST by a video-enabled telemedicine application and verified that I am speaking with the correct person using two identifiers.  Location: Patient: Virtual Visit Location  Patient: Home Provider: Virtual Visit Location Provider: Home Office   I discussed the limitations of evaluation and management by telemedicine and the availability of in person appointments. The patient expressed understanding and agreed to proceed.    History of Present Illness: Steve Bass is a 43 y.o. who identifies as a male who was assigned male at birth, and is being seen today for influenza exposure. He reports headache, cough, sore throat, body aches and chills and fever 102. No wheezing or sob. Sx for 1 day.   HPI: HPI  Problems:  Patient Active Problem List   Diagnosis Date Noted   Restless legs syndrome 03/21/2018   Delusional disorder, somatic type, multiple episodes, currently in acute episode (HCC) 10/17/2017   Binge-eating disorder, moderate 10/17/2017   Hypogonadism in male 05/28/2013   Opioid use disorder, severe, on maintenance therapy, dependence (HCC) 11/28/2012   Smoker 10/20/2012   Polysubstance dependence (HCC) 09/10/2012   Other stimulant dependence with stimulant-induced mood disorder (HCC) 09/10/2012   Generalized anxiety disorder 09/10/2012   PTSD (post-traumatic stress disorder) 09/10/2012   GERD 01/21/2008   Attention deficit hyperactivity disorder (ADHD), combined type, severe 11/21/2007    Allergies: Allergies[1] Medications: Current Medications[2]  Observations/Objective: Patient is well-developed, well-nourished in no acute distress.  Resting comfortably  at home.  Head is normocephalic, atraumatic.  No labored breathing.  Speech is clear and coherent with logical content.  Patient is alert and oriented at baseline.    Assessment and Plan: 1. Influenza (Primary)  Increase fluids, see educational information, UC as needed   Follow Up Instructions: I discussed the assessment and treatment plan with the patient. The patient was provided an opportunity to ask questions and all were answered. The patient agreed with the plan and  demonstrated an understanding of the instructions.  A copy of instructions were sent to the patient via MyChart unless otherwise noted below.     The patient was advised to call back or seek an in-person evaluation if the symptoms worsen or if the condition fails to improve as anticipated.    Steve Bollman, FNP     [1]  Allergies Allergen Reactions   Cefaclor Rash  [2]  Current Outpatient Medications:    oseltamivir (TAMIFLU) 75 MG capsule, Take 1 capsule (75 mg total) by mouth 2 (two) times daily for 5 days., Disp: 10 capsule, Rfl: 0   promethazine -dextromethorphan (PROMETHAZINE -DM) 6.25-15 MG/5ML syrup, Take 5 mLs by mouth 4 (four) times daily as needed for cough., Disp: 118 mL, Rfl: 0   albuterol  (VENTOLIN  HFA) 108 (90 Base) MCG/ACT inhaler, TAKE 2 PUFFS BY MOUTH EVERY 6 HOURS AS NEEDED FOR WHEEZE OR SHORTNESS OF BREATH, Disp: 18 each, Rfl: 2   benzonatate  (TESSALON ) 200 MG capsule, Take 1 capsule (200 mg total) by mouth 3 (three) times daily as needed., Disp: 30 capsule, Rfl: 1   cloNIDine  (CATAPRES ) 0.1 MG tablet, Take 0.1 mg by mouth 3 (three) times daily., Disp: , Rfl:    escitalopram (LEXAPRO) 20 MG tablet, Take 20 mg by mouth daily., Disp: , Rfl:    finasteride  (PROPECIA ) 1 MG tablet, Take 1 tablet (1 mg total) by mouth daily., Disp: 30 tablet, Rfl: 11   gabapentin  (NEURONTIN ) 600 MG tablet, Take 1-1.5 tablets by mouth in the morning, at noon, and at bedtime., Disp: , Rfl:    ibuprofen  (ADVIL ) 600 MG tablet, Take 1 tablet (600 mg total) by mouth every 8 (eight) hours as needed., Disp: 30 tablet, Rfl: 0   LATUDA  20 MG TABS tablet, Take 20 mg by mouth daily., Disp: , Rfl:    omeprazole  (PRILOSEC) 40 MG capsule, Take 1 capsule (40 mg total) by mouth daily., Disp: 30 capsule, Rfl: 11   propranolol  ER (INDERAL  LA) 160 MG SR capsule, Take 160 mg by mouth daily., Disp: , Rfl:    QUEtiapine (SEROQUEL) 25 MG tablet, Take 25 mg by mouth at bedtime., Disp: , Rfl:   "

## 2024-01-15 NOTE — Patient Instructions (Signed)

## 2024-02-06 ENCOUNTER — Ambulatory Visit (INDEPENDENT_AMBULATORY_CARE_PROVIDER_SITE_OTHER): Admitting: Family Medicine

## 2024-02-06 VITALS — BP 128/85 | HR 79 | Temp 98.0°F | Ht 73.0 in | Wt 264.0 lb

## 2024-02-06 DIAGNOSIS — Z0001 Encounter for general adult medical examination with abnormal findings: Secondary | ICD-10-CM

## 2024-02-06 DIAGNOSIS — Z13220 Encounter for screening for lipoid disorders: Secondary | ICD-10-CM

## 2024-02-06 DIAGNOSIS — Z131 Encounter for screening for diabetes mellitus: Secondary | ICD-10-CM

## 2024-02-06 DIAGNOSIS — Z0000 Encounter for general adult medical examination without abnormal findings: Secondary | ICD-10-CM

## 2024-02-06 DIAGNOSIS — J069 Acute upper respiratory infection, unspecified: Secondary | ICD-10-CM | POA: Diagnosis not present

## 2024-02-06 LAB — BAYER DCA HB A1C WAIVED: HB A1C (BAYER DCA - WAIVED): 5.3 % (ref 4.8–5.6)

## 2024-02-06 LAB — LIPID PANEL

## 2024-02-06 LAB — VERITOR FLU A/B WAIVED
Influenza A: NEGATIVE
Influenza B: NEGATIVE

## 2024-02-06 MED ORDER — FLUTICASONE PROPIONATE 50 MCG/ACT NA SUSP: 1.0000 | Freq: Two times a day (BID) | NASAL | 1 refills | Status: AC | PRN

## 2024-02-06 MED ORDER — DEXTROMETHORPHAN-GUAIFENESIN 10-100 MG/5ML PO LIQD: 5.0000 mL | ORAL | 0 refills | Status: AC | PRN

## 2024-02-06 NOTE — Progress Notes (Signed)
 "  BP 128/85   Pulse 79   Temp 98 F (36.7 C)   Ht 6' 1 (1.854 m)   Wt 264 lb (119.7 kg)   SpO2 99%   BMI 34.83 kg/m    Subjective:   Patient ID: Steve Bass, male    DOB: Dec 22, 1980, 44 y.o.   MRN: [id number]  HPI: Steve Bass is a 44 y.o. male presenting on 02/06/2024 for Medical Management of Chronic Issues (CPE) and Nasal Congestion   Discussed the use of AI scribe software for clinical note transcription with the patient, who gave verbal consent to proceed.  History of Present Illness   Steve Bass is a 44 year old male who presents with a cough and rib pain.  Cough and sputum production - Cough for approximately four days - Productive of thick, yellowish sputum - Suspects possible exposure to illness after a bowling outing about a week ago  Fever and chills - Fever with highest recorded temperature of 100.45F last night - Associated chills  Rib pain - Rib pain attributed to excessive coughing  Symptomatic treatment - Used Hycodan cough syrup, which improved sleep  Psychiatric and neurologic medication use - Currently taking Lexapro, Adderall, propranolol  for stage fright, gabapentin  for restless legs, Seroquel, and clonazepam  - Occasionally misses doses if he falls asleep early          Relevant past medical, surgical, family and social history reviewed and updated as indicated. Interim medical history since our last visit reviewed. Allergies and medications reviewed and updated.  Review of Systems  Constitutional:  Positive for chills and fever.  HENT:  Positive for congestion, postnasal drip, rhinorrhea, sinus pressure, sneezing and sore throat. Negative for ear discharge, ear pain, tinnitus and voice change.   Eyes:  Negative for pain, discharge, redness and visual disturbance.  Respiratory:  Negative for cough, shortness of breath and wheezing.   Cardiovascular:  Positive for chest pain (Right lower chest wall pain with coughing).  Negative for palpitations and leg swelling.  Gastrointestinal:  Negative for abdominal pain, blood in stool, constipation and diarrhea.  Genitourinary:  Negative for dysuria and hematuria.  Musculoskeletal:  Negative for arthralgias, back pain, gait problem and myalgias.  Skin:  Negative for rash.  Neurological:  Negative for dizziness, weakness and headaches.  Psychiatric/Behavioral:  Negative for suicidal ideas.   All other systems reviewed and are negative.   Per HPI unless specifically indicated above   Allergies as of 02/06/2024       Reactions   Cefaclor Rash   Bee Venom         Medication List        Accurate as of February 06, 2024  2:52 PM. If you have any questions, ask your nurse or doctor.          STOP taking these medications    benzonatate  200 MG capsule Commonly known as: TESSALON  Stopped by: Fonda Levins, MD       TAKE these medications    amphetamine -dextroamphetamine 20 MG tablet Commonly known as: ADDERALL Take 20 mg by mouth daily.   amphetamine -dextroamphetamine 30 MG 24 hr capsule Commonly known as: ADDERALL XR Take 30 mg by mouth every morning.   clonazePAM  1 MG tablet Commonly known as: KLONOPIN  Take 1 mg by mouth 4 (four) times daily as needed.   cloNIDine  0.1 MG tablet Commonly known as: CATAPRES  Take 0.1 mg by mouth 3 (three) times daily.   DAYQUIL PO Take 1 Dose  by mouth daily.   dextromethorphan -guaiFENesin  10-100 MG/5ML liquid Commonly known as: Tussin DM Take 5 mLs by mouth every 4 (four) hours as needed for cough. Started by: Fonda Levins, MD   escitalopram 20 MG tablet Commonly known as: LEXAPRO Take 20 mg by mouth daily.   finasteride  1 MG tablet Commonly known as: PROPECIA  Take 1 tablet (1 mg total) by mouth daily.   gabapentin  600 MG tablet Commonly known as: NEURONTIN  Take 1-1.5 tablets by mouth in the morning, at noon, and at bedtime.   ibuprofen  600 MG tablet Commonly known as: ADVIL  Take 1  tablet (600 mg total) by mouth every 8 (eight) hours as needed.   Latuda  20 MG Tabs tablet Generic drug: lurasidone  Take 20 mg by mouth daily.   omeprazole  40 MG capsule Commonly known as: PRILOSEC Take 1 capsule (40 mg total) by mouth daily.   promethazine -dextromethorphan  6.25-15 MG/5ML syrup Commonly known as: PROMETHAZINE -DM Take 5 mLs by mouth 4 (four) times daily as needed for cough.   propranolol  ER 160 MG SR capsule Commonly known as: INDERAL  LA Take 160 mg by mouth daily.   QUEtiapine 25 MG tablet Commonly known as: SEROQUEL Take 25 mg by mouth at bedtime.   Ventolin  HFA 108 (90 Base) MCG/ACT inhaler Generic drug: albuterol  TAKE 2 PUFFS BY MOUTH EVERY 6 HOURS AS NEEDED FOR WHEEZE OR SHORTNESS OF BREATH         Objective:   BP 128/85   Pulse 79   Temp 98 F (36.7 C)   Ht 6' 1 (1.854 m)   Wt 264 lb (119.7 kg)   SpO2 99%   BMI 34.83 kg/m   Wt Readings from Last 3 Encounters:  02/06/24 264 lb (119.7 kg)  09/15/21 278 lb (126.1 kg)  10/15/20 300 lb (136.1 kg)    Physical Exam Physical Exam   VITALS: P- 79, BP- 128/85 HEENT: Throat erythematous, no exudate. NECK: Thyroid normal, no thyromegaly. CHEST: Lungs clear to auscultation bilaterally. CARDIOVASCULAR: Heart regular rate and rhythm, no murmurs. ABDOMEN: Tenderness over lower ribs on palpation.         Assessment & Plan:   Problem List Items Addressed This Visit   None Visit Diagnoses       Physical exam    -  Primary   Relevant Orders   Bayer DCA Hb A1c Waived   CBC with Differential/Platelet   CMP14+EGFR     Upper respiratory tract infection, unspecified type       Relevant Medications   dextromethorphan -guaiFENesin  (TUSSIN DM) 10-100 MG/5ML liquid   Other Relevant Orders   Veritor Flu A/B Waived     Diabetes mellitus screening       Relevant Orders   Bayer DCA Hb A1c Waived     Screening cholesterol level       Relevant Orders   Lipid panel   TSH          General adult  medical examination Routine examination with normal vital signs and no significant findings. - Ordered blood work for kidney function, liver function, blood glucose, and cholesterol.  Acute upper respiratory infection Cough with yellowish sputum and fever. Negative COVID test. Possible viral infection, including influenza. - Swabbed for influenza. - Initiate treatment if influenza confirmed.  Intercostal muscle strain Pain in lower ribs due to muscle strain from coughing. No rib fracture suspected. - Recommended heating pads for pain relief. - Advised Aleve  or Advil  for pain management.      Flu negative, send  in some cough syrup for him and recommended that he take Mucinex  and use Flonase  and that should self resolve, likely other viral illness.    Follow up plan: Return in about 1 year (around 02/05/2025), or if symptoms worsen or fail to improve, for Physical exam.  Counseling provided for all of the vaccine components Orders Placed This Encounter  Procedures   Bayer DCA Hb A1c Waived   CBC with Differential/Platelet   CMP14+EGFR   Lipid panel   TSH   Veritor Flu A/B Waived    Fonda Levins, MD [facility] 02/06/2024, 2:52 PM     "

## 2024-02-07 LAB — LIPID PANEL
Cholesterol, Total: 261 mg/dL — AB (ref 100–199)
HDL: 61 mg/dL
LDL CALC COMMENT:: 4.3 ratio (ref 0.0–5.0)
LDL Chol Calc (NIH): 184 mg/dL — AB (ref 0–99)
Triglycerides: 91 mg/dL (ref 0–149)
VLDL Cholesterol Cal: 16 mg/dL (ref 5–40)

## 2024-02-07 LAB — CBC WITH DIFFERENTIAL/PLATELET
Basophils Absolute: 0.1 x10E3/uL (ref 0.0–0.2)
Basos: 1 %
EOS (ABSOLUTE): 0.1 x10E3/uL (ref 0.0–0.4)
Eos: 1 %
Hematocrit: 48.5 % (ref 37.5–51.0)
Hemoglobin: 16.3 g/dL (ref 13.0–17.7)
Immature Grans (Abs): 0 x10E3/uL (ref 0.0–0.1)
Immature Granulocytes: 0 %
Lymphocytes Absolute: 1.9 x10E3/uL (ref 0.7–3.1)
Lymphs: 33 %
MCH: 31.3 pg (ref 26.6–33.0)
MCHC: 33.6 g/dL (ref 31.5–35.7)
MCV: 93 fL (ref 79–97)
Monocytes Absolute: 0.6 x10E3/uL (ref 0.1–0.9)
Monocytes: 11 %
Neutrophils Absolute: 3.2 x10E3/uL (ref 1.4–7.0)
Neutrophils: 54 %
Platelets: 390 x10E3/uL (ref 150–450)
RBC: 5.2 x10E6/uL (ref 4.14–5.80)
RDW: 13 % (ref 11.6–15.4)
WBC: 5.9 x10E3/uL (ref 3.4–10.8)

## 2024-02-07 LAB — CMP14+EGFR
ALT: 30 IU/L (ref 0–44)
AST: 27 IU/L (ref 0–40)
Albumin: 5.1 g/dL (ref 4.1–5.1)
Alkaline Phosphatase: 83 IU/L (ref 47–123)
BUN/Creatinine Ratio: 17 (ref 9–20)
BUN: 15 mg/dL (ref 6–24)
Bilirubin Total: 0.6 mg/dL (ref 0.0–1.2)
CO2: 21 mmol/L (ref 20–29)
Calcium: 10.4 mg/dL — AB (ref 8.7–10.2)
Chloride: 98 mmol/L (ref 96–106)
Creatinine, Ser: 0.89 mg/dL (ref 0.76–1.27)
Globulin, Total: 2.9 g/dL (ref 1.5–4.5)
Glucose: 98 mg/dL (ref 70–99)
Potassium: 5 mmol/L (ref 3.5–5.2)
Sodium: 137 mmol/L (ref 134–144)
Total Protein: 8 g/dL (ref 6.0–8.5)
eGFR: 110 mL/min/1.73

## 2024-02-07 LAB — TSH: TSH: 2.06 u[IU]/mL (ref 0.450–4.500)

## 2024-02-13 ENCOUNTER — Ambulatory Visit: Payer: Self-pay | Admitting: Family Medicine

## 2024-02-21 ENCOUNTER — Telehealth: Payer: Self-pay | Admitting: Family Medicine

## 2024-02-21 NOTE — Telephone Encounter (Signed)
 For PCP to advise on.    Copied from CRM #[id number]. Topic: Clinical - Medication Question >> Feb 21, 2024 11:15 AM Cherylann RAMAN wrote: Reason for CRM: Patient called and requesting provider to write Rx for Propecia  1 MG (FINASTERIDE ). Patient states he has mentioned it to Dr. Maryanne in most recent appt. Please contact patient at [phone] for additional questions.

## 2025-02-06 ENCOUNTER — Encounter: Admitting: Family Medicine
